# Patient Record
Sex: Male | Born: 1935 | Race: White | Hispanic: No | Smoking: Never smoker
Health system: Southern US, Community
[De-identification: ages and names within clinical notes are randomized; demographics above are authoritative.]

## PROBLEM LIST (undated history)

## (undated) DIAGNOSIS — E119 Type 2 diabetes mellitus without complications: Secondary | ICD-10-CM

## (undated) DIAGNOSIS — Z950 Presence of cardiac pacemaker: Secondary | ICD-10-CM

## (undated) DIAGNOSIS — G4730 Sleep apnea, unspecified: Secondary | ICD-10-CM

## (undated) DIAGNOSIS — I509 Heart failure, unspecified: Secondary | ICD-10-CM

## (undated) DIAGNOSIS — J302 Other seasonal allergic rhinitis: Secondary | ICD-10-CM

## (undated) DIAGNOSIS — E785 Hyperlipidemia, unspecified: Secondary | ICD-10-CM

## (undated) DIAGNOSIS — I5042 Chronic combined systolic (congestive) and diastolic (congestive) heart failure: Secondary | ICD-10-CM

## (undated) DIAGNOSIS — I219 Acute myocardial infarction, unspecified: Secondary | ICD-10-CM

## (undated) DIAGNOSIS — Z860100 Personal history of colon polyps, unspecified: Secondary | ICD-10-CM

## (undated) DIAGNOSIS — M1990 Unspecified osteoarthritis, unspecified site: Secondary | ICD-10-CM

## (undated) DIAGNOSIS — Z8739 Personal history of other diseases of the musculoskeletal system and connective tissue: Secondary | ICD-10-CM

## (undated) DIAGNOSIS — I442 Atrioventricular block, complete: Secondary | ICD-10-CM

## (undated) DIAGNOSIS — I10 Essential (primary) hypertension: Secondary | ICD-10-CM

## (undated) DIAGNOSIS — C4490 Unspecified malignant neoplasm of skin, unspecified: Secondary | ICD-10-CM

## (undated) HISTORY — DX: Sleep apnea, unspecified: G47.30

## (undated) HISTORY — PX: JOINT REPLACEMENT: SHX530

## (undated) HISTORY — PX: TOTAL KNEE ARTHROPLASTY: SHX125

## (undated) HISTORY — DX: Other seasonal allergic rhinitis: J30.2

## (undated) HISTORY — PX: TRANSURETHRAL RESECTION OF PROSTATE: SHX73

## (undated) HISTORY — PX: POLYPECTOMY: SHX149

## (undated) HISTORY — PX: COLONOSCOPY: SHX174

## (undated) HISTORY — DX: Atrioventricular block, complete: I44.2

## (undated) HISTORY — DX: Personal history of colonic polyps: Z86.010

## (undated) HISTORY — DX: Personal history of colon polyps, unspecified: Z86.0100

## (undated) HISTORY — PX: TONSILLECTOMY: SUR1361

## (undated) HISTORY — DX: Essential (primary) hypertension: I10

## (undated) HISTORY — DX: Hyperlipidemia, unspecified: E78.5

## (undated) HISTORY — DX: Unspecified malignant neoplasm of skin, unspecified: C44.90

## (undated) HISTORY — DX: Heart failure, unspecified: I50.9

## (undated) HISTORY — DX: Chronic combined systolic (congestive) and diastolic (congestive) heart failure: I50.42

---

## 1966-04-06 HISTORY — PX: CHOLECYSTECTOMY OPEN: SUR202

## 1997-11-08 ENCOUNTER — Other Ambulatory Visit: Admission: RE | Admit: 1997-11-08 | Discharge: 1997-11-08 | Payer: Self-pay | Admitting: Urology

## 1998-01-02 ENCOUNTER — Other Ambulatory Visit: Admission: RE | Admit: 1998-01-02 | Discharge: 1998-01-02 | Payer: Self-pay | Admitting: Gastroenterology

## 1999-01-07 ENCOUNTER — Encounter: Payer: Self-pay | Admitting: Urology

## 1999-01-08 ENCOUNTER — Inpatient Hospital Stay (HOSPITAL_COMMUNITY): Admission: RE | Admit: 1999-01-08 | Discharge: 1999-01-10 | Payer: Self-pay | Admitting: Urology

## 1999-01-15 ENCOUNTER — Encounter: Payer: Self-pay | Admitting: Emergency Medicine

## 1999-01-15 ENCOUNTER — Inpatient Hospital Stay (HOSPITAL_COMMUNITY): Admission: EM | Admit: 1999-01-15 | Discharge: 1999-01-19 | Payer: Self-pay | Admitting: Emergency Medicine

## 1999-06-09 ENCOUNTER — Encounter: Payer: Self-pay | Admitting: Orthopaedic Surgery

## 1999-06-12 ENCOUNTER — Inpatient Hospital Stay (HOSPITAL_COMMUNITY): Admission: RE | Admit: 1999-06-12 | Discharge: 1999-06-15 | Payer: Self-pay | Admitting: Orthopaedic Surgery

## 2001-05-26 ENCOUNTER — Other Ambulatory Visit: Admission: RE | Admit: 2001-05-26 | Discharge: 2001-05-26 | Payer: Self-pay | Admitting: Dermatology

## 2002-09-11 ENCOUNTER — Other Ambulatory Visit: Admission: RE | Admit: 2002-09-11 | Discharge: 2002-09-11 | Payer: Self-pay | Admitting: Dermatology

## 2002-10-16 ENCOUNTER — Other Ambulatory Visit: Admission: RE | Admit: 2002-10-16 | Discharge: 2002-10-16 | Payer: Self-pay | Admitting: Dermatology

## 2004-07-10 ENCOUNTER — Other Ambulatory Visit: Admission: RE | Admit: 2004-07-10 | Discharge: 2004-07-10 | Payer: Self-pay | Admitting: Dermatology

## 2004-07-28 ENCOUNTER — Ambulatory Visit: Payer: Self-pay | Admitting: Gastroenterology

## 2004-08-11 ENCOUNTER — Ambulatory Visit: Payer: Self-pay | Admitting: Gastroenterology

## 2004-12-17 ENCOUNTER — Other Ambulatory Visit: Admission: RE | Admit: 2004-12-17 | Discharge: 2004-12-17 | Payer: Self-pay | Admitting: Dermatology

## 2005-03-09 ENCOUNTER — Ambulatory Visit (HOSPITAL_COMMUNITY): Admission: RE | Admit: 2005-03-09 | Discharge: 2005-03-09 | Payer: Self-pay | Admitting: Family Medicine

## 2008-11-09 ENCOUNTER — Encounter: Payer: Self-pay | Admitting: Cardiology

## 2008-11-09 ENCOUNTER — Ambulatory Visit: Payer: Self-pay | Admitting: Cardiology

## 2008-11-09 ENCOUNTER — Ambulatory Visit (HOSPITAL_COMMUNITY): Admission: RE | Admit: 2008-11-09 | Discharge: 2008-11-09 | Payer: Self-pay | Admitting: Cardiology

## 2008-11-09 DIAGNOSIS — N400 Enlarged prostate without lower urinary tract symptoms: Secondary | ICD-10-CM | POA: Insufficient documentation

## 2008-11-09 DIAGNOSIS — C61 Malignant neoplasm of prostate: Secondary | ICD-10-CM

## 2008-11-09 DIAGNOSIS — M1990 Unspecified osteoarthritis, unspecified site: Secondary | ICD-10-CM | POA: Insufficient documentation

## 2008-11-09 DIAGNOSIS — E119 Type 2 diabetes mellitus without complications: Secondary | ICD-10-CM

## 2008-11-09 DIAGNOSIS — I10 Essential (primary) hypertension: Secondary | ICD-10-CM

## 2008-11-09 DIAGNOSIS — Z96659 Presence of unspecified artificial knee joint: Secondary | ICD-10-CM

## 2008-11-12 ENCOUNTER — Encounter: Payer: Self-pay | Admitting: Cardiology

## 2008-11-12 LAB — CONVERTED CEMR LAB
BUN: 15 mg/dL (ref 6–23)
Basophils Absolute: 0 10*3/uL (ref 0.0–0.1)
Basophils Relative: 0 % (ref 0–1)
CO2: 25 meq/L (ref 19–32)
Calcium: 9.3 mg/dL (ref 8.4–10.5)
Chloride: 102 meq/L (ref 96–112)
Creatinine, Ser: 1.13 mg/dL (ref 0.40–1.50)
Eosinophils Absolute: 0.4 10*3/uL (ref 0.0–0.7)
Eosinophils Relative: 7 % — ABNORMAL HIGH (ref 0–5)
Glucose, Bld: 133 mg/dL — ABNORMAL HIGH (ref 70–99)
HCT: 43 % (ref 39.0–52.0)
Hemoglobin: 14.4 g/dL (ref 13.0–17.0)
INR: 0.9 (ref 0.0–1.5)
Lymphocytes Relative: 11 % — ABNORMAL LOW (ref 12–46)
Lymphs Abs: 0.7 10*3/uL (ref 0.7–4.0)
MCHC: 33.4 g/dL (ref 30.0–36.0)
MCV: 85.4 fL (ref 78.0–100.0)
Monocytes Absolute: 0.6 10*3/uL (ref 0.1–1.0)
Monocytes Relative: 9 % (ref 3–12)
Neutro Abs: 4.3 10*3/uL (ref 1.7–7.7)
Neutrophils Relative %: 71 % (ref 43–77)
Platelets: 141 10*3/uL — ABNORMAL LOW (ref 150–400)
Potassium: 4 meq/L (ref 3.5–5.3)
Prothrombin Time: 12.5 s (ref 11.6–15.2)
RBC: 5.04 M/uL (ref 4.22–5.81)
RDW: 13.5 % (ref 11.5–15.5)
Sodium: 137 meq/L (ref 135–145)
WBC: 6.1 10*3/uL (ref 4.0–10.5)
aPTT: 25 s (ref 24–37)

## 2008-11-14 ENCOUNTER — Inpatient Hospital Stay (HOSPITAL_BASED_OUTPATIENT_CLINIC_OR_DEPARTMENT_OTHER): Admission: RE | Admit: 2008-11-14 | Discharge: 2008-11-14 | Payer: Self-pay | Admitting: Cardiology

## 2008-11-14 ENCOUNTER — Ambulatory Visit: Payer: Self-pay | Admitting: Cardiology

## 2008-11-19 ENCOUNTER — Ambulatory Visit (HOSPITAL_COMMUNITY): Admission: RE | Admit: 2008-11-19 | Discharge: 2008-11-19 | Payer: Self-pay | Admitting: Cardiology

## 2008-11-20 DIAGNOSIS — J8410 Pulmonary fibrosis, unspecified: Secondary | ICD-10-CM

## 2008-11-22 ENCOUNTER — Encounter: Payer: Self-pay | Admitting: Cardiology

## 2008-11-29 ENCOUNTER — Ambulatory Visit: Payer: Self-pay | Admitting: Cardiology

## 2008-11-29 DIAGNOSIS — R0602 Shortness of breath: Secondary | ICD-10-CM

## 2008-12-03 ENCOUNTER — Encounter: Payer: Self-pay | Admitting: Cardiology

## 2008-12-18 ENCOUNTER — Ambulatory Visit (HOSPITAL_COMMUNITY): Admission: RE | Admit: 2008-12-18 | Discharge: 2008-12-18 | Payer: Self-pay | Admitting: Pulmonary Disease

## 2009-01-29 ENCOUNTER — Encounter: Payer: Self-pay | Admitting: Cardiology

## 2009-02-11 ENCOUNTER — Ambulatory Visit: Admission: RE | Admit: 2009-02-11 | Discharge: 2009-02-11 | Payer: Self-pay | Admitting: Pulmonary Disease

## 2009-07-12 ENCOUNTER — Encounter (INDEPENDENT_AMBULATORY_CARE_PROVIDER_SITE_OTHER): Payer: Self-pay | Admitting: *Deleted

## 2010-04-06 HISTORY — PX: COLONOSCOPY: SHX174

## 2010-05-06 NOTE — Letter (Signed)
Summary: Colonoscopy Letter  [facility]  [address] [place], [place] [address]   Phone: [phone]  Fax: [phone]      July 12, 2009 MRN: [id number]   [facility] [address] [place], [place]  [address]   Dear Anthony Lucas,   According to your medical record, it is time for you to schedule a Colonoscopy. The American Cancer Society recommends this procedure as a method to detect early colon cancer. Patients with a family history of colon cancer, or a personal history of colon polyps or inflammatory bowel disease are at increased risk.  This letter has beeen generated based on the recommendations made at the time of your procedure. If you feel that in your particular situation this may no longer apply, please contact our office.  Please call our office at [phone] to schedule this appointment or to update your records at your earliest convenience.  Thank you for cooperating with [place] to provide you with the very best care possible.   Sincerely,    Vania Rea. Jarold Motto, M.D.  [facility] [phone]

## 2010-07-11 LAB — BLOOD GAS, ARTERIAL
Acid-Base Excess: 1.2 mmol/L (ref 0.0–2.0)
FIO2: 0.21 %
O2 Saturation: 95.1 %
Patient temperature: 37
pO2, Arterial: 75.8 mmHg — ABNORMAL LOW (ref 80.0–100.0)

## 2010-07-13 LAB — POCT I-STAT 3, VENOUS BLOOD GAS (G3P V)
Acid-Base Excess: 1 mmol/L (ref 0.0–2.0)
Acid-Base Excess: 2 mmol/L (ref 0.0–2.0)
Bicarbonate: 27.2 mEq/L — ABNORMAL HIGH (ref 20.0–24.0)
O2 Saturation: 66 %
O2 Saturation: 68 %
TCO2: 29 mmol/L (ref 0–100)
TCO2: 29 mmol/L (ref 0–100)
pO2, Ven: 35 mmHg (ref 30.0–45.0)

## 2010-07-13 LAB — POCT I-STAT GLUCOSE: Glucose, Bld: 132 mg/dL — ABNORMAL HIGH (ref 70–99)

## 2010-08-19 NOTE — Cardiovascular Report (Signed)
NAMEMERRIT, FRIESEN                  ACCOUNT NO.:  [id number]   MEDICAL RECORD NO.:  [id number]          PATIENT TYPE:  OIB   LOCATION:  1962                         FACILITY:  [facility]   PHYSICIAN:  Arturo Morton. Riley Kill, MD, FACCDATE OF BIRTH:  08/30/1930   DATE OF PROCEDURE:  11/14/2008  DATE OF DISCHARGE:  11/14/2008                            CARDIAC CATHETERIZATION   INDICATIONS:  Mr. Grissinger is a 77 year old who has presented with fatigue  and shortness of breath.  He has not had fever or substantial cough.  Chest x-ray was fairly abnormal and a CT scan has been planned.  The  patient is, however, diabetic and with his symptoms, it was felt that  cardiac catheterization was indicated.  Risks, benefits, and  alternatives were discussed with the patient and the procedure explained  to him prior to the procedure.  He does have an interventricular  conduction delay compatible with left bundle.   PROCEDURE:  1. Right and left heart catheterization.  2. Selective coronary arteriography.  3. Selective left ventriculography.   DESCRIPTION OF PROCEDURE:  The patient was brought to the  catheterization laboratory and prepped and draped in usual fashion.  He  had been set up for left heart cath.  Views of the left and right  coronaries were obtained using 4-French catheters.  Central aortic and  left ventricular pressures were measured with a pigtail, and  ventriculography was performed in the RAO projection.  Given the  patient's chest x-ray, his dyspnea of effort, and lack of significant  critical coronary artery disease, it was felt that it would be better to  obtain the right heart pressures at this point in time.  As a result, we  used a Smart needle to gain access to the right femoral vein and a 7-  [place] sheath was placed.  Thermodilution Swan-Ganz catheter was then  taken to the superior vena cava where saturation was obtained.  Serial  right heart pressures were then obtained as well  as thermodilution  cardiac outputs.  PA sat was also obtained.  Subsequently, the right  heart catheter was removed.  Hemostasis was achieved by direct manual  compression.  He was taken to the holding area.  I subsequently spoke  with family and with Dr. Daleen Squibb regarding the findings.  He will be seen  back in followup at [facility].   HEMODYNAMIC DATA:  1. Right atrial pressure 7.  2. RV 38/9.  3. Pulmonary artery 37/18, mean 27.  4. Pulmonary capillary wedge 14.  5. Thermodilution cardiac output 4.0 liters per minute.  6. Thermodilution cardiac index 1.7 liters per minute per meter      squared.  7. Fick cardiac output 5.3 liters per minute.  8. Fick cardiac index 2.3 liters per minute per meter squared.  9. Aortic saturation 96%.  10.Pulmonary artery saturation 68%.  11.Superior vena cava saturation 66%.   ANGIOGRAPHIC DATA:  1. Ventriculography done in the RAO projection revealed preserved with      low normal overall ejection fraction, estimated EF would be in the  range of 50-55%.  On one beat, there was some diastolic mitral      regurgitation but systolic MR was not thought to be prominent.  No      definite wall motion abnormalities were visualized.  2. The right coronary artery is a moderate-sized vessel.  It is      relatively smooth with minimal luminal irregularity.  There is a      large PDA and 2 smaller posterolateral branches, both of which are      without critical narrowing.  3. The left main coronary artery is free of critical disease.  4. The LAD courses to the apex.  After a small diagonal and septal,      there is a little bit of calcified plaque measuring about 30%      luminal reduction.  The distal LAD tapers but is without critical      disease.  5. The circumflex provides predominantly 2 large marginal branches,      both of which are free of critical disease.   CONCLUSIONS:  1. Mild pulmonary hypertension.  2. No critical coronary artery  disease.  3. Low normal overall ejection fraction.  4. Abnormal chest x-ray.   PLAN:  Two-dimensional echocardiography will be recommended.  CT scan of  the chest has already been scheduled.  Follow up will be with Dr. Daleen Squibb  to conclude where the next portion of the evaluation will go.   ADDENDUM  Aortic pressure was 159/80, mean 113.  LV pressure 161/23.      Arturo Morton. Riley Kill, MD, [facility]  Electronically Signed     TDS/MEDQ  D:  11/14/2008  T:  11/14/2008  Job:  [id number]   cc:   Thomas C. Wall, MD, [facility]  Scott A. Gerda Diss, MD  CV Laboratory

## 2010-08-19 NOTE — Assessment & Plan Note (Signed)
[facility]                       [place] [facility] OFFICE NOTE   NAME:Schillo, JESSIE SCHRIEBER                         MRN:          [id number]  DATE:11/09/2008                            DOB:          1931/01/31    CHIEF COMPLAINT:  Just give out quick and short of breath.   HISTORY OF PRESENT ILLNESS:  [name] is a very pleasant 77-year-  old married white male who has been having increased shortness of breath  with exertion and early fatigue over the last couple of months.  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   He recently saw Lorin Picket Luking and EKG showed a left bundle-branch block.  He is diabetic and has been for 7 or 8 years.  He was referred for an  ischemic evaluation.   He denies any angina.  He denies any orthopnea, PND, or peripheral  edema.  He has had no tachy palpitations or syncope.   His past medical history is significant for benign prostatic  hypertrophy, hypertension, osteoarthritis, status post left knee  replacement, colonic polyps, type 2 diabetes, prostate CA status post  seed treatment.   Surgeries include a T and A in 1981, cholecystectomy in 1971, left knee  replacement, and a TURP in 2001.   He is current meds include:  1. Toprol-XL 50 mg once a day.  2. Proscar are 5 mg a day.  3. Hydrochlorothiazide 25 mg per day.  4. Allopurinol 100 mg per day.  5. Metformin 500 mg per day.  6. Simvastatin 40 mg at bedtime.  7. Potassium 10 mEq 2 tablets in the morning daily.  8. Terazosin 5 mg p.o. daily.  9. Aspirin 81 mg per day.   His family history is noncontributory.   SOCIAL HISTORY:  He is married.  He does not use tobacco.  He drinks  Bourbon about 4 ounces a day.  He does not regularly exercise but enjoys  manual work.   REVIEW OF SYSTEMS:  He denies any anorexia, weight loss or weight gain,  visual loss or symptoms of TIAs, syncope, peripheral  edema, prolonged  cough, hemoptysis, change in bowel habits, muscle weakness, difficulty  walking.   PHYSICAL EXAMINATION:  GENERAL:  He is a pleasant gentleman in no acute  distress.  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  He is being followed by  Dermatology and have been treated most of them.  He is slightly  overweight.  He looks very muscular for a man his age.  VITAL SIGNS:  His blood pressure is 139/80 in the right arm.  His pulse  is 67 and regular.  HEENT:  Normocephalic and atraumatic.  No facial asymmetry.  Dentition  in fair condition.  Oral mucosa normal.  NECK:  Supple.  Carotid upstrokes were equal bilaterally without bruits.  No JVD.  Thyroid is not enlarged.  Trachea is midline.  CHEST/LUNGS:  Clear to auscultation and percussion.  HEART:  A nondisplaced PMI.  He has a normal S1 and a paradoxically  split S2.  There is no murmur.  ABDOMEN:  Soft.  Good bowel sounds.  No midline bruit.  No hepatomegaly.  EXTREMITIES:  There were no cyanosis, clubbing, or edema.  Pulses are  2+/4+, bilaterally symmetrical.  NEUROLOGIC:  Intact.  MUSCULOSKELETAL:  Chronic arthritic changes.  SKIN:  Notable for the Cancers as mentioned above.   EKG shows normal sinus rhythm with left bundle-branch block.  Other than  the EKG from Dr. Fletcher Anon office, I have none to compare.   ASSESSMENT:  Increased dyspnea on exertion with exertional fatigue in a  diabetic with multiple risk factors, new left bundle-branch block, rule  out obstructive coronary artery disease.   PLAN:  Cardiac cath as an outpatient.  Indications, risks, potential  benefits have been discussed with the patient and his wife.  They agree  to proceed.  There is no history of contrast allergy.     Thomas C. Daleen Squibb, MD, [facility]  Electronically Signed    TCW/MedQ  DD: 11/09/2008  DT: 11/09/2008  Job #: [id number]   cc:   Lorin Picket A. Gerda Diss, MD

## 2010-08-22 NOTE — Discharge Summary (Signed)
. [facility]  Patient:    Anthony Lucas, Anthony Lucas                         MRN: [id number] Adm. Date:  [id number] Disc. Date: [id number] Attending:  Randolm Idol Dictator:   Arnoldo Morale, P.A.                           Discharge Summary  ADMISSION DIAGNOSES: 1. End-stage osteoarthritis of the left knee. 2. Hypertension. 3. Gout. 4. History of benign prostatic hypertrophy.  DISCHARGE DIAGNOSES:  Same with the addition of: 1. Urinary retention and hematuria, resolved. 2. Post-hemorrhagic anemia.  SURGICAL PROCEDURE:  On 06/12/99, he underwent a left total knee arthroplasty by r. Claude Manges. [place].  COMPLICATIONS:  None.  CONSULTS: 1. Pharmacy consult for Coumadin therapy 06/12/99. 2. Urology consult by Dr. Vernie Ammons on 06/12/99. 3. Physical therapy and case management consult 06/13/99. 4. Rehabilitation medicine consult on 06/13/99.  HISTORY OF PRESENT ILLNESS:  This 77 year old white male presented to Dr. Cleophas Dunker with a three year history of progressively worsening left knee pain.  It is described as a popping sensation in the knee with a sharp pain throughout the knee with activities.  There is a constant ache in the knee and a fair amount of crepitus and swelling.  The pain increases with ambulation and he does have difficulty sleeping.  He has had several aspirations of the knee with cortisone  shots with moderate relief.  He currently is able to ambulate without the use of a cane or a walker, but the pain is severe enough that he is presenting for a left total knee arthroplasty.  HOSPITAL COURSE:  [name] tolerated his surgical procedure well without any immediate postoperative complications.  He was transferred to [address].  That night, he was unable to void and the nurses were unable to place a catheter initially.  Eventually, an 18-French Coude catheter was inserted and did well until about midnight and then they were unable to irrigate  or get any urine back from the catheter.  Dr. Vernie Ammons was paged and he came in and treated him at that time. n postoperative day 1, his T-max was 101.3 and his vitals were stable. Hemoglobin was 9.3 with hematocrit of 28.1.  He tolerated CPM to 0 to 70 degrees. Epidural was controlling his pain well.  Left knee dressing was intact without drainage.  Leg was neurovascularly intact.  He was started on PT per protocol.  Postoperative day 2, he was afebrile, vital signs stable, and his left knee incision was well approximated with staples.  Drain was discontinued, epidural discontinued, and he was started on p.o. pain meds.  H&H were monitored.  He continued to do well over the next several days and remained afebrile with his vital signs stable.  OxyContin controlled his pain well and he was doing well enough to be ready for discharge home on 06/15/99.  DISCHARGE INSTRUCTIONS: 1. He was to resume all prehospitalization medications and diet. 2. OxyContin 10 mg 1 tablet p.o. q. 12 hours, OxyIR 5 mg 1-2 p.o. q. 4 hours p.r.n.    for pain, and Coumadin 5 mg p.o. q. day. 3. He was to be out of bed with a walker, 50% weight bearing or less on the left    leg. 4. He is to keep the left knee incision clean and dry an notify Dr. Cleophas Dunker  of    any fever greater than 101.5, chills, pain unrelieved by pain meds or foul    smelling drainage from the wound. 5. He is to arrange for home health physical therapy and a home health R.N. 6. He is to follow up with Dr. Cleophas Dunker in the office in 7-10 days and was to    call [phone] to set up that appointment.  He stated good understanding of    these instructions and was discharged home.  LABORATORY DATA:  On 01/15/99, he had a chest x-ray, which showed mild chronic obstructive pulmonary disease and questionable bronchitis.  On 06/09/99, his hemoglobin was 12.8 with hematocrit of 37.2.  On 3/9, his hemoglobin was 9.3 with hematocrit 28.1.  On 3/10,  his hemoglobin was 9.2 with a hematocrit of 28.  On 3/11, his hemoglobin was 8.7 with hematocrit of 28.  On 3/11, his hemoglobin was 8.7 with hematocrit 25.9.  On 3/5, his PT was 14 seconds with an INR of 1.2 and a PTT of 27.  On 3/11, his PT was 17.4 seconds with an INR of 1.8.  On 06/09/99, his chloride was 95, sodium 138, CO2 30, glucose 138, ALT 48, AST 49, and ALP 62.  Urinalysis on 06/13/99 showed red cloudy urine with 15 mg/dL of ketones, 454 mg/dL of protein, large amount of hemoglobin, rare epithelial, 11 WBCs, too numerous to count RBCs, and few bacteria.  Urine culture done at that time showed no growth. All other laboratory studies were within normal limits. DD:  07/26/99 TD:  07/27/99 Job: [id number]

## 2010-08-22 NOTE — Op Note (Signed)
[place]  Patient:    Anthony Lucas, Anthony Lucas                         MRN: [id number] Proc. Date: 06/11/99 Adm. Date:  [id number] Attending:  Randolm Idol                           Operative Report  PREOPERATIVE DIAGNOSIS:  Osteoarthritis of the left knee.  POSTOPERATIVE DIAGNOSIS:  Osteoarthritis of the left knee.  OPERATION:  Left total knee replacement.  SURGEON:  Claude Manges. Cleophas Dunker, M.D.  ASSISTANT:  Jamelle Rushing, P.A.-C.  ANESTHESIA:  General orotracheal  COMPLICATIONS:  None.  COMPONENTS:  DePuy LCS large femur, large plus rotating tibia platform with a 10 mg bridging bearing and a rotating metal back patella.  All were secured with polymethyl methacrylate.  DESCRIPTION OF PROCEDURE:  With the patient comfortable on the operating table nd under general orotracheal anesthesia, the left lower extremity was placed in thigh tourniquet.  The leg was then prepped with Duraprep and a thigh tourniquet to the mid foot. Sterile draping was performed. With the extremity still elevated it was Esmarch exsanguinated with a proximal tourniquet to 350 mmHg.  A midline longitudinal incision was made and centered over the patella and via sharp dissection carried down to the subcutaneous tissue, extended from the superior pouch to the tibial tubercle.  The first layer of capsule was incised n the midline.  The deep capsule was incised on the medial parapatellar fascia with the Bovie.  There was about 5 to 6 cc of clear yellow joint effusion.  The patella was then everted 180 degrees and the knee flexed to 90 degrees. There were large osteophytes along the medial and lateral femoral condyle.  Complete absence of articular cartilage with eburnated bone on the medial femoral condyle and a moderate amount of beefy red synovial tissue.  A complete synovectomy was  performed.  The osteophytes were removed.  Preoperatively, we had measured  a large femoral component, large plus tibial component.  These were confirmed intraoperatively.  The appropriate tibial and femoral jigs were applied to obtain the appropriate cuts.  We used 10 mm flexion and extension gaps which were symmetrical.  The ACL and PCL were sacrificed as we used a deep dish tibial component for anterior and posterior stability. A  4 degree distal femoral valgus cut was made.  The lamina spreaders were inserted to remove the medial and lateral menisci and  remnants of ACL and PCL.  There was a fixed flexor contracture preoperatively and a capsular release was performed with the Cobb elevator.  Osteophytes were removed along the medial tibial plateau and even posterior tibia to prevent postoperative pain and impingement. The trial components were then impacted using a large plus rotating tibial platform and 10 mm bridging bearing and a large femoral component. We had excellent fit at each of the components.  As I placed the knee through a  full range of motion, there was slight hyperextension. We could flex probably 125 to 130 degrees without step-off or instability with the varus or valgus stress.  There was no malrotation of the tibial component.  The patella was then prepared by removing 11 mm of bone, leaving approximately 13 in place.  The cruciate backed jig was applied and the appropriate cut made to accept the patella.  A trial patella was impacted. Fit flush  in the patella and  placed through a full range of motion with no lateral subluxation.  Each of the trial components were then removed.  The joint was then copiously lavaged with jet saline and antibiotic solution.  The knee was flexed.  The McHale retractor was inserted behind the tibia and each of the components were then applied and fixed with polymethyl methacrylate.  Extraneous methacrylate was removed with Michaelle Copas. The knee was placed in full extension for further compression.   Again we had excellent fit.  The patella was applied with the patella clamp using methacrylate.  After complete maturation of the methacrylate, the patellar clamp was removed. The knee was placed through a full range of motion without evidence of instability.  Extraneous methacrylate was removed with a small osteotome.  The joint was again copiously irrigated with saline and antibiotic solution. Tourniquet was deflated.  Bleeders were Bovie coagulated.  Hemovac was inserted. Deep capsule was closed with interrupted #1 Ethibon.  The superficial capsule closed with running 0 Vicryl. Subcuticular with 2-0 Vicryl and the skin closed ith skin clips.  Sterile bulky dressing was applied. Followed by Ace bandage and a nee immobilizer.  The patient was to receive a postoperative epidural.  The patient tolerated the procedure without complications. DD:  06/12/99 TD:  06/13/99 Job: [id number]

## 2010-08-22 NOTE — Procedures (Signed)
[place]  Patient:    NOOR, VIDALES                         MRN: [id number] Proc. Date: 06/12/99 Adm. Date:  [id number] Attending:  Randolm Idol                           Procedure Report  PROCEDURE:  Lumbar epidural catheter placement for postoperative pain control.  SURGEON:  Guadalupe Maple, M.D.  ANESTHESIA:  INDICATIONS:  The patient is a 77 year old white male with a history of degenerative joint disease of the left knee, who underwent a left total knee replacement by Claude Manges. Cleophas Dunker, M.D. under general anesthesia.  Prior to the  procedure, the risks and benefits of epidural pain control were discussed with he patient.  Risks included bleeding, headache, infection, nerve root injury, and he patient understood these risks and agreed to proceed.  He was on pain control prior to surgery.  DESCRIPTION OF PROCEDURE:  Upon completion of the procedure while the patient was still under general endotracheal anesthesia, he was turned to the left lateral decubitus position.  The low back was prepped and draped sterilely.  The L3-4 interspace was entered in the midline using an 18-gauge Tuohy needle.  One pass of the needle was required to enter the epidural space using loss of resistance _______.  2 cc of _____ Marcaine was injected through the needle without difficulty.  The catheter was then threaded 4 cm into the epidural space, and the needle was removed without difficulty.  The catheter was then taped securely in  place, and the patient was subsequently extubated, and brought to the recovery oom in stable condition.  He will then be begun on a ______ Marcaine infusion, and followed on a daily basis by the anesthesia service.  He tolerated the procedure without apparent complications. DD:  06/12/99 TD:  06/14/99 Job: [id number]

## 2010-11-10 ENCOUNTER — Telehealth: Payer: Self-pay | Admitting: *Deleted

## 2010-11-10 NOTE — Telephone Encounter (Signed)
Scheduled pre visit 11/13/2010 and  Colonoscopy on 11/26/2010 advised with he will need someone to drive him here and home on that day. I have went over everything that is needed for the previsit and colonoscopy date I will not mail a letter since its this week. I gave them the number and address if they have problems.

## 2010-11-13 ENCOUNTER — Ambulatory Visit (AMBULATORY_SURGERY_CENTER): Payer: Medicare Other

## 2010-11-13 VITALS — Ht 73.0 in | Wt 235.0 lb

## 2010-11-13 DIAGNOSIS — Z1211 Encounter for screening for malignant neoplasm of colon: Secondary | ICD-10-CM

## 2010-11-13 MED ORDER — PEG-KCL-NACL-NASULF-NA ASC-C 100 G PO SOLR: ORAL | Status: DC

## 2010-11-26 ENCOUNTER — Ambulatory Visit (AMBULATORY_SURGERY_CENTER): Payer: Medicare Other | Admitting: Gastroenterology

## 2010-11-26 ENCOUNTER — Encounter: Payer: Self-pay | Admitting: Gastroenterology

## 2010-11-26 DIAGNOSIS — D175 Benign lipomatous neoplasm of intra-abdominal organs: Secondary | ICD-10-CM

## 2010-11-26 DIAGNOSIS — Z86010 Personal history of colonic polyps: Secondary | ICD-10-CM

## 2010-11-26 DIAGNOSIS — K5730 Diverticulosis of large intestine without perforation or abscess without bleeding: Secondary | ICD-10-CM | POA: Insufficient documentation

## 2010-11-26 DIAGNOSIS — Z1211 Encounter for screening for malignant neoplasm of colon: Secondary | ICD-10-CM | POA: Insufficient documentation

## 2010-11-26 LAB — GLUCOSE, CAPILLARY: Glucose-Capillary: 156 mg/dL — ABNORMAL HIGH (ref 70–99)

## 2010-11-26 MED ORDER — SODIUM CHLORIDE 0.9 % IV SOLN: 500.0000 mL | INTRAVENOUS | Status: DC

## 2010-11-26 NOTE — Patient Instructions (Signed)
Discharge instructions given with verbal understanding. Handouts on diverticulosis and hemorrhoids given. Resume previous medications.

## 2010-11-27 ENCOUNTER — Telehealth: Payer: Self-pay | Admitting: *Deleted

## 2010-11-27 NOTE — Telephone Encounter (Signed)

## 2011-03-18 ENCOUNTER — Encounter: Payer: Self-pay | Admitting: Cardiology

## 2011-04-21 ENCOUNTER — Ambulatory Visit (INDEPENDENT_AMBULATORY_CARE_PROVIDER_SITE_OTHER): Payer: Medicare Other | Admitting: Urology

## 2011-04-21 DIAGNOSIS — R319 Hematuria, unspecified: Secondary | ICD-10-CM | POA: Diagnosis not present

## 2011-04-21 DIAGNOSIS — R310 Gross hematuria: Secondary | ICD-10-CM | POA: Diagnosis not present

## 2011-04-21 DIAGNOSIS — N400 Enlarged prostate without lower urinary tract symptoms: Secondary | ICD-10-CM

## 2011-04-21 DIAGNOSIS — D4959 Neoplasm of unspecified behavior of other genitourinary organ: Secondary | ICD-10-CM | POA: Diagnosis not present

## 2011-04-21 DIAGNOSIS — E1149 Type 2 diabetes mellitus with other diabetic neurological complication: Secondary | ICD-10-CM | POA: Diagnosis not present

## 2011-04-21 DIAGNOSIS — E119 Type 2 diabetes mellitus without complications: Secondary | ICD-10-CM | POA: Diagnosis not present

## 2011-05-05 ENCOUNTER — Ambulatory Visit (INDEPENDENT_AMBULATORY_CARE_PROVIDER_SITE_OTHER): Payer: Medicare Other | Admitting: Urology

## 2011-05-05 DIAGNOSIS — R310 Gross hematuria: Secondary | ICD-10-CM

## 2011-05-05 DIAGNOSIS — N400 Enlarged prostate without lower urinary tract symptoms: Secondary | ICD-10-CM | POA: Diagnosis not present

## 2011-06-30 DIAGNOSIS — E1149 Type 2 diabetes mellitus with other diabetic neurological complication: Secondary | ICD-10-CM | POA: Diagnosis not present

## 2011-06-30 DIAGNOSIS — E119 Type 2 diabetes mellitus without complications: Secondary | ICD-10-CM | POA: Diagnosis not present

## 2011-07-27 DIAGNOSIS — D235 Other benign neoplasm of skin of trunk: Secondary | ICD-10-CM | POA: Diagnosis not present

## 2011-07-27 DIAGNOSIS — L570 Actinic keratosis: Secondary | ICD-10-CM | POA: Diagnosis not present

## 2011-09-08 DIAGNOSIS — E119 Type 2 diabetes mellitus without complications: Secondary | ICD-10-CM | POA: Diagnosis not present

## 2011-09-08 DIAGNOSIS — E1149 Type 2 diabetes mellitus with other diabetic neurological complication: Secondary | ICD-10-CM | POA: Diagnosis not present

## 2011-09-17 DIAGNOSIS — L578 Other skin changes due to chronic exposure to nonionizing radiation: Secondary | ICD-10-CM | POA: Diagnosis not present

## 2011-09-17 DIAGNOSIS — L570 Actinic keratosis: Secondary | ICD-10-CM | POA: Diagnosis not present

## 2011-09-21 DIAGNOSIS — E119 Type 2 diabetes mellitus without complications: Secondary | ICD-10-CM | POA: Diagnosis not present

## 2011-09-21 DIAGNOSIS — I10 Essential (primary) hypertension: Secondary | ICD-10-CM | POA: Diagnosis not present

## 2011-09-21 DIAGNOSIS — E785 Hyperlipidemia, unspecified: Secondary | ICD-10-CM | POA: Diagnosis not present

## 2011-09-24 DIAGNOSIS — Z79899 Other long term (current) drug therapy: Secondary | ICD-10-CM | POA: Diagnosis not present

## 2011-09-24 DIAGNOSIS — E782 Mixed hyperlipidemia: Secondary | ICD-10-CM | POA: Diagnosis not present

## 2011-09-24 DIAGNOSIS — E119 Type 2 diabetes mellitus without complications: Secondary | ICD-10-CM | POA: Diagnosis not present

## 2011-10-26 DIAGNOSIS — L570 Actinic keratosis: Secondary | ICD-10-CM | POA: Diagnosis not present

## 2011-12-02 ENCOUNTER — Telehealth: Payer: Self-pay | Admitting: *Deleted

## 2011-12-02 NOTE — Telephone Encounter (Signed)
Left a message with pt's wife that he needs occult stool testing yearly and he is due; wife stated understanding.

## 2011-12-02 NOTE — Telephone Encounter (Signed)
Message copied by Florene Glen on Wed Dec 02, 2011 11:04 AM ------      Message from: Florene Glen      Created: Tue Dec 02, 2010 11:11 AM      Regarding: IFOB       Pt needs yearly hemoccult IFOB testing per 11/26/10 COLON.

## 2011-12-16 DIAGNOSIS — E119 Type 2 diabetes mellitus without complications: Secondary | ICD-10-CM | POA: Diagnosis not present

## 2011-12-16 DIAGNOSIS — E1149 Type 2 diabetes mellitus with other diabetic neurological complication: Secondary | ICD-10-CM | POA: Diagnosis not present

## 2012-01-15 DIAGNOSIS — Z23 Encounter for immunization: Secondary | ICD-10-CM | POA: Diagnosis not present

## 2012-02-15 NOTE — Telephone Encounter (Signed)
Pt never called back; wrote him a letter.

## 2012-02-24 DIAGNOSIS — E119 Type 2 diabetes mellitus without complications: Secondary | ICD-10-CM | POA: Diagnosis not present

## 2012-02-24 DIAGNOSIS — E1149 Type 2 diabetes mellitus with other diabetic neurological complication: Secondary | ICD-10-CM | POA: Diagnosis not present

## 2012-03-07 DIAGNOSIS — D235 Other benign neoplasm of skin of trunk: Secondary | ICD-10-CM | POA: Diagnosis not present

## 2012-03-07 DIAGNOSIS — L821 Other seborrheic keratosis: Secondary | ICD-10-CM | POA: Diagnosis not present

## 2012-03-07 DIAGNOSIS — L570 Actinic keratosis: Secondary | ICD-10-CM | POA: Diagnosis not present

## 2012-05-04 DIAGNOSIS — E119 Type 2 diabetes mellitus without complications: Secondary | ICD-10-CM | POA: Diagnosis not present

## 2012-05-04 DIAGNOSIS — E1149 Type 2 diabetes mellitus with other diabetic neurological complication: Secondary | ICD-10-CM | POA: Diagnosis not present

## 2012-05-26 DIAGNOSIS — I10 Essential (primary) hypertension: Secondary | ICD-10-CM | POA: Diagnosis not present

## 2012-05-26 DIAGNOSIS — E119 Type 2 diabetes mellitus without complications: Secondary | ICD-10-CM | POA: Diagnosis not present

## 2012-05-26 DIAGNOSIS — E785 Hyperlipidemia, unspecified: Secondary | ICD-10-CM | POA: Diagnosis not present

## 2012-05-26 DIAGNOSIS — Z79899 Other long term (current) drug therapy: Secondary | ICD-10-CM | POA: Diagnosis not present

## 2012-05-26 DIAGNOSIS — M109 Gout, unspecified: Secondary | ICD-10-CM | POA: Diagnosis not present

## 2012-06-03 DIAGNOSIS — M109 Gout, unspecified: Secondary | ICD-10-CM | POA: Diagnosis not present

## 2012-06-03 DIAGNOSIS — Z79899 Other long term (current) drug therapy: Secondary | ICD-10-CM | POA: Diagnosis not present

## 2012-06-03 DIAGNOSIS — E782 Mixed hyperlipidemia: Secondary | ICD-10-CM | POA: Diagnosis not present

## 2012-06-03 DIAGNOSIS — Z125 Encounter for screening for malignant neoplasm of prostate: Secondary | ICD-10-CM | POA: Diagnosis not present

## 2012-06-23 DIAGNOSIS — H2589 Other age-related cataract: Secondary | ICD-10-CM | POA: Diagnosis not present

## 2012-06-23 DIAGNOSIS — H43819 Vitreous degeneration, unspecified eye: Secondary | ICD-10-CM | POA: Diagnosis not present

## 2012-06-23 DIAGNOSIS — H524 Presbyopia: Secondary | ICD-10-CM | POA: Diagnosis not present

## 2012-06-23 DIAGNOSIS — H52229 Regular astigmatism, unspecified eye: Secondary | ICD-10-CM | POA: Diagnosis not present

## 2012-06-23 DIAGNOSIS — H5210 Myopia, unspecified eye: Secondary | ICD-10-CM | POA: Diagnosis not present

## 2012-07-19 DIAGNOSIS — E1149 Type 2 diabetes mellitus with other diabetic neurological complication: Secondary | ICD-10-CM | POA: Diagnosis not present

## 2012-07-19 DIAGNOSIS — E119 Type 2 diabetes mellitus without complications: Secondary | ICD-10-CM | POA: Diagnosis not present

## 2012-08-08 DIAGNOSIS — E119 Type 2 diabetes mellitus without complications: Secondary | ICD-10-CM | POA: Diagnosis not present

## 2012-08-08 DIAGNOSIS — H2589 Other age-related cataract: Secondary | ICD-10-CM | POA: Diagnosis not present

## 2012-08-31 ENCOUNTER — Encounter (HOSPITAL_COMMUNITY): Payer: Self-pay | Admitting: Pharmacy Technician

## 2012-08-31 NOTE — Patient Instructions (Addendum)
Your procedure is scheduled on: 09/08/2012  Report to [facility] at  820    AM.  Call this number if you have problems the morning of surgery: [phone]   Do not eat food or drink liquids :After Midnight.      Take these medicines the morning of surgery with A SIP OF WATER: allopurinol,proscar,metoprolol   Do not wear jewelry, make-up or nail polish.  Do not wear lotions, powders, or perfumes.   Do not shave 48 hours prior to surgery.  Do not bring valuables to the hospital.  Contacts, dentures or bridgework may not be worn into surgery.  Leave suitcase in the car. After surgery it may be brought to your room.  For patients admitted to the hospital, checkout time is 11:00 AM the day of discharge.   Patients discharged the day of surgery will not be allowed to drive home.  :     Please read over the following fact sheets that you were given: Coughing and Deep Breathing, Surgical Site Infection Prevention, Anesthesia Post-op Instructions and Care and Recovery After Surgery    Cataract A cataract is a clouding of the lens of the eye. When a lens becomes cloudy, vision is reduced based on the degree and nature of the clouding. Many cataracts reduce vision to some degree. Some cataracts make people more near-sighted as they develop. Other cataracts increase glare. Cataracts that are ignored and become worse can sometimes look white. The white color can be seen through the pupil. CAUSES   Aging. However, cataracts may occur at any age, even in newborns.   Certain drugs.   Trauma to the eye.   Certain diseases such as diabetes.   Specific eye diseases such as chronic inflammation inside the eye or a sudden attack of a rare form of glaucoma.   Inherited or acquired medical problems.  SYMPTOMS   Gradual, progressive drop in vision in the affected eye.   Severe, rapid visual loss. This most often happens when trauma is the cause.  DIAGNOSIS  To detect a cataract, an eye doctor  examines the lens. Cataracts are best diagnosed with an exam of the eyes with the pupils enlarged (dilated) by drops.  TREATMENT  For an early cataract, vision may improve by using different eyeglasses or stronger lighting. If that does not help your vision, surgery is the only effective treatment. A cataract needs to be surgically removed when vision loss interferes with your everyday activities, such as driving, reading, or watching TV. A cataract may also have to be removed if it prevents examination or treatment of another eye problem. Surgery removes the cloudy lens and usually replaces it with a substitute lens (intraocular lens, IOL).  At a time when both you and your doctor agree, the cataract will be surgically removed. If you have cataracts in both eyes, only one is usually removed at a time. This allows the operated eye to heal and be out of danger from any possible problems after surgery (such as infection or poor wound healing). In rare cases, a cataract may be doing damage to your eye. In these cases, your caregiver may advise surgical removal right away. The vast majority of people who have cataract surgery have better vision afterward. HOME CARE INSTRUCTIONS  If you are not planning surgery, you may be asked to do the following:  Use different eyeglasses.   Use stronger or brighter lighting.   Ask your eye doctor about reducing your medicine dose or changing  medicines if it is thought that a medicine caused your cataract. Changing medicines does not make the cataract go away on its own.   Become familiar with your surroundings. Poor vision can lead to injury. Avoid bumping into things on the affected side. You are at a higher risk for tripping or falling.   Exercise extreme care when driving or operating machinery.   Wear sunglasses if you are sensitive to bright [place] or experiencing problems with glare.  SEEK IMMEDIATE MEDICAL CARE IF:   You have a worsening or sudden vision  loss.   You notice redness, swelling, or increasing pain in the eye.   You have a fever.  Document Released: 03/23/2005 Document Revised: 03/12/2011 Document Reviewed: 11/14/2010 [facility] Patient Information 2012 [email or web address] INSTRUCTIONS POST-ANESTHESIA  IMMEDIATELY FOLLOWING SURGERY:  Do not drive or operate machinery for the first twenty four hours after surgery.  Do not make any important decisions for twenty four hours after surgery or while taking narcotic pain medications or sedatives.  If you develop intractable nausea and vomiting or a severe headache please notify your doctor immediately.  FOLLOW-UP:  Please make an appointment with your surgeon as instructed. You do not need to follow up with anesthesia unless specifically instructed to do so.  WOUND CARE INSTRUCTIONS (if applicable):  Keep a dry clean dressing on the anesthesia/puncture wound site if there is drainage.  Once the wound has quit draining you may leave it open to air.  Generally you should leave the bandage intact for twenty four hours unless there is drainage.  If the epidural site drains for more than 36-48 hours please call the anesthesia department.  QUESTIONS?:  Please feel free to call your physician or the hospital operator if you have any questions, and they will be happy to assist you.

## 2012-09-01 ENCOUNTER — Encounter (HOSPITAL_COMMUNITY)
Admission: RE | Admit: 2012-09-01 | Discharge: 2012-09-01 | Disposition: A | Discharge status: Home / Self Care | Payer: Medicare Other | Source: Ambulatory Visit | Attending: Ophthalmology | Admitting: Ophthalmology

## 2012-09-01 ENCOUNTER — Other Ambulatory Visit: Payer: Self-pay

## 2012-09-01 ENCOUNTER — Encounter (HOSPITAL_COMMUNITY): Payer: Self-pay

## 2012-09-01 DIAGNOSIS — H2510 Age-related nuclear cataract, unspecified eye: Secondary | ICD-10-CM | POA: Diagnosis not present

## 2012-09-01 DIAGNOSIS — E119 Type 2 diabetes mellitus without complications: Secondary | ICD-10-CM | POA: Diagnosis not present

## 2012-09-01 DIAGNOSIS — I10 Essential (primary) hypertension: Secondary | ICD-10-CM | POA: Diagnosis not present

## 2012-09-01 DIAGNOSIS — Z01812 Encounter for preprocedural laboratory examination: Secondary | ICD-10-CM | POA: Diagnosis not present

## 2012-09-01 DIAGNOSIS — Z01810 Encounter for preprocedural cardiovascular examination: Secondary | ICD-10-CM | POA: Diagnosis not present

## 2012-09-01 DIAGNOSIS — Z79899 Other long term (current) drug therapy: Secondary | ICD-10-CM | POA: Diagnosis not present

## 2012-09-01 LAB — BASIC METABOLIC PANEL
BUN: 18 mg/dL (ref 6–23)
Creatinine, Ser: 1.25 mg/dL (ref 0.50–1.35)
GFR calc Af Amer: 61 mL/min — ABNORMAL LOW (ref >90)
GFR calc non Af Amer: 53 mL/min — ABNORMAL LOW (ref >90)
Glucose, Bld: 170 mg/dL — ABNORMAL HIGH (ref 70–99)

## 2012-09-05 DIAGNOSIS — H2589 Other age-related cataract: Secondary | ICD-10-CM | POA: Diagnosis not present

## 2012-09-07 MED ORDER — NEOMYCIN-POLYMYXIN-DEXAMETH 3.5-10000-0.1 OP OINT
TOPICAL_OINTMENT | OPHTHALMIC | Status: AC
  Filled 2012-09-07: qty 3.5

## 2012-09-07 MED ORDER — CYCLOPENTOLATE-PHENYLEPHRINE 0.2-1 % OP SOLN
OPHTHALMIC | Status: AC
  Filled 2012-09-07: qty 2

## 2012-09-07 MED ORDER — LIDOCAINE HCL (PF) 1 % IJ SOLN
INTRAMUSCULAR | Status: AC
  Filled 2012-09-07: qty 2

## 2012-09-07 MED ORDER — LIDOCAINE HCL 3.5 % OP GEL
OPHTHALMIC | Status: AC
  Filled 2012-09-07: qty 5

## 2012-09-07 MED ORDER — PHENYLEPHRINE HCL 2.5 % OP SOLN
OPHTHALMIC | Status: AC
  Filled 2012-09-07: qty 2

## 2012-09-07 MED ORDER — TETRACAINE HCL 0.5 % OP SOLN
OPHTHALMIC | Status: AC
  Filled 2012-09-07: qty 2

## 2012-09-08 ENCOUNTER — Ambulatory Visit (HOSPITAL_COMMUNITY): Payer: Medicare Other | Admitting: Anesthesiology

## 2012-09-08 ENCOUNTER — Encounter (HOSPITAL_COMMUNITY)
Admission: RE | Disposition: A | Discharge status: Home / Self Care | Payer: Self-pay | Source: Ambulatory Visit | Attending: Ophthalmology

## 2012-09-08 ENCOUNTER — Encounter (HOSPITAL_COMMUNITY): Payer: Self-pay | Admitting: *Deleted

## 2012-09-08 ENCOUNTER — Ambulatory Visit (HOSPITAL_COMMUNITY)
Admission: RE | Admit: 2012-09-08 | Discharge: 2012-09-08 | Disposition: A | Discharge status: Home / Self Care | Payer: Medicare Other | Source: Ambulatory Visit | Attending: Ophthalmology | Admitting: Ophthalmology

## 2012-09-08 ENCOUNTER — Encounter (HOSPITAL_COMMUNITY): Payer: Self-pay | Admitting: Anesthesiology

## 2012-09-08 DIAGNOSIS — Z01810 Encounter for preprocedural cardiovascular examination: Secondary | ICD-10-CM | POA: Diagnosis not present

## 2012-09-08 DIAGNOSIS — Z01812 Encounter for preprocedural laboratory examination: Secondary | ICD-10-CM | POA: Diagnosis not present

## 2012-09-08 DIAGNOSIS — H2510 Age-related nuclear cataract, unspecified eye: Secondary | ICD-10-CM | POA: Diagnosis not present

## 2012-09-08 DIAGNOSIS — I10 Essential (primary) hypertension: Secondary | ICD-10-CM | POA: Insufficient documentation

## 2012-09-08 DIAGNOSIS — H2181 Floppy iris syndrome: Secondary | ICD-10-CM | POA: Diagnosis not present

## 2012-09-08 DIAGNOSIS — Z79899 Other long term (current) drug therapy: Secondary | ICD-10-CM | POA: Insufficient documentation

## 2012-09-08 DIAGNOSIS — H269 Unspecified cataract: Secondary | ICD-10-CM | POA: Diagnosis not present

## 2012-09-08 DIAGNOSIS — H2589 Other age-related cataract: Secondary | ICD-10-CM | POA: Diagnosis not present

## 2012-09-08 DIAGNOSIS — E119 Type 2 diabetes mellitus without complications: Secondary | ICD-10-CM | POA: Insufficient documentation

## 2012-09-08 HISTORY — PX: CATARACT EXTRACTION W/PHACO: SHX586

## 2012-09-08 SURGERY — PHACOEMULSIFICATION, CATARACT, WITH IOL INSERTION
Anesthesia: Monitor Anesthesia Care | Site: Eye | Laterality: Right | Wound class: Clean

## 2012-09-08 MED ORDER — TETRACAINE HCL 0.5 % OP SOLN
1.0000 [drp] | OPHTHALMIC | Status: AC
  Administered 2012-09-08 (×3): 1 [drp] via OPHTHALMIC

## 2012-09-08 MED ORDER — CYCLOPENTOLATE-PHENYLEPHRINE 0.2-1 % OP SOLN
1.0000 [drp] | OPHTHALMIC | Status: AC
  Administered 2012-09-08 (×3): 1 [drp] via OPHTHALMIC

## 2012-09-08 MED ORDER — LIDOCAINE HCL 3.5 % OP GEL: 1.0000 "application " | Freq: Once | OPHTHALMIC | Status: DC

## 2012-09-08 MED ORDER — EPINEPHRINE HCL 1 MG/ML IJ SOLN
INTRAMUSCULAR | Status: AC
Start: 2012-09-08 — End: 2012-09-08
  Filled 2012-09-08: qty 1

## 2012-09-08 MED ORDER — MIDAZOLAM HCL 2 MG/2ML IJ SOLN
INTRAMUSCULAR | Status: AC
  Filled 2012-09-08: qty 2

## 2012-09-08 MED ORDER — LACTATED RINGERS IV SOLN
INTRAVENOUS | Status: DC | PRN
  Administered 2012-09-08: 09:00:00 via INTRAVENOUS

## 2012-09-08 MED ORDER — MIDAZOLAM HCL 2 MG/2ML IJ SOLN
1.0000 mg | INTRAMUSCULAR | Status: DC | PRN
  Administered 2012-09-08: 2 mg via INTRAVENOUS

## 2012-09-08 MED ORDER — BSS IO SOLN
INTRAOCULAR | Status: DC | PRN
  Administered 2012-09-08: 15 mL via INTRAOCULAR

## 2012-09-08 MED ORDER — PROVISC 10 MG/ML IO SOLN
INTRAOCULAR | Status: DC | PRN
  Administered 2012-09-08: 8.5 mg via INTRAOCULAR

## 2012-09-08 MED ORDER — TETRACAINE HCL 0.5 % OP SOLN: 1.0000 [drp] | OPHTHALMIC | Status: DC

## 2012-09-08 MED ORDER — PHENYLEPHRINE HCL 2.5 % OP SOLN
1.0000 [drp] | OPHTHALMIC | Status: AC
  Administered 2012-09-08 (×3): 1 [drp] via OPHTHALMIC

## 2012-09-08 MED ORDER — LACTATED RINGERS IV SOLN
INTRAVENOUS | Status: DC
  Administered 2012-09-08: 09:00:00 via INTRAVENOUS

## 2012-09-08 MED ORDER — NEOMYCIN-POLYMYXIN-DEXAMETH 0.1 % OP OINT
TOPICAL_OINTMENT | OPHTHALMIC | Status: DC | PRN
  Administered 2012-09-08: 1 via OPHTHALMIC

## 2012-09-08 MED ORDER — EPINEPHRINE HCL 1 MG/ML IJ SOLN
INTRAOCULAR | Status: DC | PRN
  Administered 2012-09-08: 10:00:00

## 2012-09-08 MED ORDER — PHENYLEPHRINE HCL 2.5 % OP SOLN: 1.0000 [drp] | OPHTHALMIC | Status: DC

## 2012-09-08 MED ORDER — POVIDONE-IODINE 5 % OP SOLN
OPHTHALMIC | Status: DC | PRN
  Administered 2012-09-08: 1 via OPHTHALMIC

## 2012-09-08 MED ORDER — LIDOCAINE HCL 3.5 % OP GEL
1.0000 "application " | Freq: Once | OPHTHALMIC | Status: AC
  Administered 2012-09-08: 1 via OPHTHALMIC

## 2012-09-08 MED ORDER — EPINEPHRINE HCL 1 MG/ML IJ SOLN
INTRAMUSCULAR | Status: DC | PRN
  Administered 2012-09-08: 10:00:00 via OPHTHALMIC

## 2012-09-08 MED ORDER — CYCLOPENTOLATE-PHENYLEPHRINE 0.2-1 % OP SOLN: 1.0000 [drp] | OPHTHALMIC | Status: DC

## 2012-09-08 SURGICAL SUPPLY — 32 items
CAPSULAR TENSION RING-AMO (OPHTHALMIC RELATED) IMPLANT
CLOTH BEACON ORANGE TIMEOUT ST (SAFETY) ×1 IMPLANT
EYE SHIELD UNIVERSAL CLEAR (GAUZE/BANDAGES/DRESSINGS) ×1 IMPLANT
GLOVE BIO SURGEON STRL SZ 6.5 (GLOVE) IMPLANT
GLOVE BIOGEL PI IND STRL 6.5 (GLOVE) IMPLANT
GLOVE BIOGEL PI IND STRL 7.0 (GLOVE) IMPLANT
GLOVE BIOGEL PI IND STRL 7.5 (GLOVE) IMPLANT
GLOVE BIOGEL PI INDICATOR 6.5 (GLOVE) ×1
GLOVE BIOGEL PI INDICATOR 7.0 (GLOVE)
GLOVE BIOGEL PI INDICATOR 7.5 (GLOVE)
GLOVE ECLIPSE 6.5 STRL STRAW (GLOVE) IMPLANT
GLOVE ECLIPSE 7.0 STRL STRAW (GLOVE) IMPLANT
GLOVE ECLIPSE 7.5 STRL STRAW (GLOVE) IMPLANT
GLOVE EXAM NITRILE LRG STRL (GLOVE) ×1 IMPLANT
GLOVE EXAM NITRILE MD LF STRL (GLOVE) IMPLANT
GLOVE SKINSENSE NS SZ6.5 (GLOVE)
GLOVE SKINSENSE NS SZ7.0 (GLOVE)
GLOVE SKINSENSE STRL SZ6.5 (GLOVE) IMPLANT
GLOVE SKINSENSE STRL SZ7.0 (GLOVE) IMPLANT
KIT VITRECTOMY (OPHTHALMIC RELATED) IMPLANT
PAD ARMBOARD 7.5X6 YLW CONV (MISCELLANEOUS) ×1 IMPLANT
PROC W NO LENS (INTRAOCULAR LENS)
PROC W SPEC LENS (INTRAOCULAR LENS)
PROCESS W NO LENS (INTRAOCULAR LENS) IMPLANT
PROCESS W SPEC LENS (INTRAOCULAR LENS) IMPLANT
RING MALYGIN (MISCELLANEOUS) IMPLANT
SIGHTPATH CAT PROC W REG LENS (Ophthalmic Related) ×2 IMPLANT
SYR TB 1ML LL NO SAFETY (SYRINGE) ×1 IMPLANT
TAPE SURG TRANSPORE 1 IN (GAUZE/BANDAGES/DRESSINGS) IMPLANT
TAPE SURGICAL TRANSPORE 1 IN (GAUZE/BANDAGES/DRESSINGS) ×1
VISCOELASTIC ADDITIONAL (OPHTHALMIC RELATED) IMPLANT
WATER STERILE IRR 250ML POUR (IV SOLUTION) ×1 IMPLANT

## 2012-09-08 NOTE — H&P (Signed)
I have reviewed the H&P, the patient was re-examined, and I have identified no interval changes in medical condition and plan of care since the history and physical of record  

## 2012-09-08 NOTE — Anesthesia Postprocedure Evaluation (Signed)
  Anesthesia Post-op Note  Patient: Anthony Lucas  Procedure(s) Performed: Procedure(s) with comments: CATARACT EXTRACTION PHACO AND INTRAOCULAR LENS PLACEMENT (IOC) (Right) - CDE: 21.28  Patient Location: Short Stay  Anesthesia Type:MAC  Level of Consciousness: awake, alert , oriented and patient cooperative  Airway and Oxygen Therapy: Patient Spontanous Breathing  Post-op Pain: none  Post-op Assessment: Post-op Vital signs reviewed, Patient's Cardiovascular Status Stable, Respiratory Function Stable, Patent Airway and No signs of Nausea or vomiting  Post-op Vital Signs: Reviewed and stable  Complications: No apparent anesthesia complications

## 2012-09-08 NOTE — Transfer of Care (Signed)
Immediate Anesthesia Transfer of Care Note  Patient: Anthony Lucas  Procedure(s) Performed: Procedure(s) with comments: CATARACT EXTRACTION PHACO AND INTRAOCULAR LENS PLACEMENT (IOC) (Right) - CDE: 21.28  Patient Location: Short Stay  Anesthesia Type:MAC  Level of Consciousness: awake, alert , oriented and patient cooperative  Airway & Oxygen Therapy: Patient Spontanous Breathing  Post-op Assessment: Report given to PACU RN and Post -op Vital signs reviewed and stable  Post vital signs: Reviewed and stable  Complications: No apparent anesthesia complications

## 2012-09-08 NOTE — Anesthesia Procedure Notes (Signed)
Procedure Name: MAC Date/Time: 09/08/2012 9:50 AM Performed by: Carolyne Littles, AMY L Pre-anesthesia Checklist: Patient identified, Timeout performed, Emergency Drugs available, Suction available and Patient being monitored Oxygen Delivery Method: Nasal cannula

## 2012-09-08 NOTE — OR Nursing (Signed)
Pt. States that he is suppose to have the right eye done , not the left, he used his drops at home and placed in right eye prior to arrival to surgery. Dr. Alto Denver notified. Procedure changed to have right eye done today.

## 2012-09-08 NOTE — Preoperative (Signed)
Beta Blockers   Reason not to administer Beta Blockers:Not Applicable 

## 2012-09-08 NOTE — Anesthesia Preprocedure Evaluation (Signed)
Anesthesia Evaluation  Patient identified by MRN, date of birth, ID band Patient awake    Reviewed: Allergy & Precautions, H&P , NPO status , Patient's Chart, lab work & pertinent test results, reviewed documented beta blocker date and time   Airway Mallampati: II TM Distance: >3 FB     Dental  (+) Poor Dentition   Pulmonary shortness of breath, sleep apnea ,  breath sounds clear to auscultation        Cardiovascular hypertension, Pt. on medications Rhythm:Regular Rate:Normal     Neuro/Psych    GI/Hepatic   Endo/Other  diabetes, Well Controlled, Type 2, Oral Hypoglycemic Agents  Renal/GU      Musculoskeletal   Abdominal   Peds  Hematology   Anesthesia Other Findings   Reproductive/Obstetrics                           Anesthesia Physical Anesthesia Plan  ASA: III  Anesthesia Plan: MAC   Post-op Pain Management:    Induction: Intravenous  Airway Management Planned: Nasal Cannula  Additional Equipment:   Intra-op Plan:   Post-operative Plan:   Informed Consent: I have reviewed the patients History and Physical, chart, labs and discussed the procedure including the risks, benefits and alternatives for the proposed anesthesia with the patient or authorized representative who has indicated his/her understanding and acceptance.     Plan Discussed with:   Anesthesia Plan Comments: (Case has been changed to Right cataract by pt and surgeon.)        Anesthesia Quick Evaluation

## 2012-09-08 NOTE — Brief Op Note (Signed)
Pre-Op Dx: Cataract OD Post-Op Dx: Cataract OD Surgeon: Erisha Paugh Anesthesia: Topical with MAC Surgery: Cataract Extraction with Intraocular lens Implant OD Implant: B&L enVista Specimen: None Complications: None 

## 2012-09-09 ENCOUNTER — Encounter (HOSPITAL_COMMUNITY): Payer: Self-pay | Admitting: Ophthalmology

## 2012-09-09 NOTE — Op Note (Signed)
NAMEJARMAN, LITTON                  ACCOUNT NO.:  [id number]  MEDICAL RECORD NO.:  [id number]  LOCATION:  [facility]                          FACILITY:  [facility]  PHYSICIAN:  Susanne Greenhouse, MD       DATE OF BIRTH:  Dec 30, 1930  DATE OF PROCEDURE:  09/08/2012 DATE OF DISCHARGE:  09/08/2012                              OPERATIVE REPORT   PREOPERATIVE DIAGNOSIS:  Combined cataract, right eye, diagnosis code 366.19.  POSTOPERATIVE DIAGNOSIS: 1. Combined cataract, right eye, diagnosis code 366.19. 2. Intraoperative floppy iris syndrome, diagnosis code 364.81.  OPERATION PERFORMED:  Phacoemulsification posterior chamber intraocular lens implantation, right eye.  ANESTHESIA:  Topical with monitored anesthesia care and IV sedation.  OPERATIVE SUMMARY:  In the preoperative area, dilating drops were placed into the right eye.  The patient was then brought into the operating room where he was placed under topical anesthesia and IV sedation.  The eye was then prepped and draped.  Beginning with a 75 blade, a paracentesis port was made at the surgeon's 2 o'clock position.  The anterior chamber was then filled with a 1% nonpreserved lidocaine solution with epinephrine.  This was followed by Viscoat to deepen the chamber.  A small fornix-based peritomy was performed superiorly.  Next, a single iris hook was placed through the limbus superiorly.  A 2.4-mm keratome blade was then used to make a clear corneal incision over the iris hook.  A bent cystotome needle and Utrata forceps were used to create a continuous tear capsulotomy.  Hydrodissection was performed using balanced salt solution on a fine cannula.  The lens nucleus was then removed using phacoemulsification in a quadrant cracking technique. The cortical material was then removed with irrigation and aspiration. The capsular bag and anterior chamber were refilled with Provisc.  The wound was widened to approximately 3 mm and a posterior  chamber intraocular lens was placed into the capsular bag without difficulty using an [employer] lens injecting system.  A single 10-0 nylon suture was then used to close the incision as well as stromal hydration. The Provisc was removed from the anterior chamber and capsular bag with irrigation and aspiration.  At this point, the wounds were tested for leak, which were negative.  The anterior chamber remained deep and stable.  The patient tolerated the procedure well.  There were no operative complications, and he awoke from topical anesthesia and IV sedation without problem.  No surgical specimens.  Prosthetic device used is a Bausch and Lomb posterior chamber lens, model MX60, power of 21.0, serial number is [id number].          ______________________________ Susanne Greenhouse, MD     KEH/MEDQ  D:  09/09/2012  T:  09/09/2012  Job:  [id number]

## 2012-09-27 DIAGNOSIS — E119 Type 2 diabetes mellitus without complications: Secondary | ICD-10-CM | POA: Diagnosis not present

## 2012-09-27 DIAGNOSIS — E1149 Type 2 diabetes mellitus with other diabetic neurological complication: Secondary | ICD-10-CM | POA: Diagnosis not present

## 2012-10-17 ENCOUNTER — Other Ambulatory Visit: Payer: Self-pay | Admitting: Family Medicine

## 2012-11-28 DIAGNOSIS — H2589 Other age-related cataract: Secondary | ICD-10-CM | POA: Diagnosis not present

## 2012-12-01 ENCOUNTER — Telehealth: Payer: Self-pay | Admitting: *Deleted

## 2012-12-01 DIAGNOSIS — H2510 Age-related nuclear cataract, unspecified eye: Secondary | ICD-10-CM | POA: Diagnosis not present

## 2012-12-01 DIAGNOSIS — Z86010 Personal history of colonic polyps: Secondary | ICD-10-CM

## 2012-12-01 NOTE — Telephone Encounter (Signed)
Mailed pt a letter and routed it to Dr Gerda Diss, for pt to have an stool for occult blood test.

## 2012-12-01 NOTE — Telephone Encounter (Signed)
Message copied by Florene Glen on Thu Dec 01, 2012  8:10 AM ------      Message from: Florene Glen      Created: Wed Dec 02, 2011 11:06 AM       Yearly hemoccult per 2012 procedure ------

## 2012-12-02 ENCOUNTER — Encounter (HOSPITAL_COMMUNITY): Payer: Self-pay

## 2012-12-02 ENCOUNTER — Encounter (HOSPITAL_COMMUNITY)
Admission: RE | Admit: 2012-12-02 | Discharge: 2012-12-02 | Disposition: A | Discharge status: Home / Self Care | Payer: Medicare Other | Source: Ambulatory Visit | Attending: Ophthalmology | Admitting: Ophthalmology

## 2012-12-02 ENCOUNTER — Encounter (HOSPITAL_COMMUNITY): Payer: Self-pay | Admitting: Pharmacy Technician

## 2012-12-02 MED ORDER — CYCLOPENTOLATE-PHENYLEPHRINE 0.2-1 % OP SOLN: 1.0000 [drp] | OPHTHALMIC | Status: DC

## 2012-12-02 MED ORDER — FENTANYL CITRATE 0.05 MG/ML IJ SOLN: 25.0000 ug | INTRAMUSCULAR | Status: DC | PRN

## 2012-12-02 MED ORDER — LIDOCAINE HCL 3.5 % OP GEL: 1.0000 "application " | Freq: Once | OPHTHALMIC | Status: DC

## 2012-12-02 MED ORDER — TETRACAINE HCL 0.5 % OP SOLN: 1.0000 [drp] | OPHTHALMIC | Status: DC

## 2012-12-02 MED ORDER — PHENYLEPHRINE HCL 2.5 % OP SOLN: 1.0000 [drp] | OPHTHALMIC | Status: DC

## 2012-12-02 MED ORDER — ONDANSETRON HCL 4 MG/2ML IJ SOLN: 4.0000 mg | Freq: Once | INTRAMUSCULAR | Status: AC | PRN

## 2012-12-06 ENCOUNTER — Encounter (HOSPITAL_COMMUNITY): Payer: Self-pay | Admitting: Pharmacy Technician

## 2012-12-07 MED ORDER — LIDOCAINE HCL (PF) 1 % IJ SOLN
INTRAMUSCULAR | Status: AC
  Filled 2012-12-07: qty 2

## 2012-12-07 MED ORDER — NEOMYCIN-POLYMYXIN-DEXAMETH 3.5-10000-0.1 OP OINT
TOPICAL_OINTMENT | OPHTHALMIC | Status: AC
  Filled 2012-12-07: qty 3.5

## 2012-12-07 MED ORDER — TETRACAINE HCL 0.5 % OP SOLN
OPHTHALMIC | Status: AC
  Filled 2012-12-07: qty 2

## 2012-12-07 MED ORDER — LIDOCAINE HCL 3.5 % OP GEL
OPHTHALMIC | Status: AC
  Filled 2012-12-07: qty 5

## 2012-12-07 MED ORDER — CYCLOPENTOLATE-PHENYLEPHRINE OP SOLN OPTIME - NO CHARGE
OPHTHALMIC | Status: AC
  Filled 2012-12-07: qty 2

## 2012-12-08 ENCOUNTER — Ambulatory Visit (HOSPITAL_COMMUNITY)
Admission: RE | Admit: 2012-12-08 | Discharge: 2012-12-08 | Disposition: A | Discharge status: Home / Self Care | Payer: Medicare Other | Source: Ambulatory Visit | Attending: Ophthalmology | Admitting: Ophthalmology

## 2012-12-08 ENCOUNTER — Encounter (HOSPITAL_COMMUNITY): Payer: Self-pay | Admitting: Anesthesiology

## 2012-12-08 ENCOUNTER — Encounter (HOSPITAL_COMMUNITY)
Admission: RE | Disposition: A | Discharge status: Home / Self Care | Payer: Self-pay | Source: Ambulatory Visit | Attending: Ophthalmology

## 2012-12-08 ENCOUNTER — Encounter (HOSPITAL_COMMUNITY): Payer: Self-pay | Admitting: *Deleted

## 2012-12-08 ENCOUNTER — Ambulatory Visit (HOSPITAL_COMMUNITY): Payer: Medicare Other | Admitting: Anesthesiology

## 2012-12-08 DIAGNOSIS — H269 Unspecified cataract: Secondary | ICD-10-CM | POA: Diagnosis not present

## 2012-12-08 DIAGNOSIS — H2589 Other age-related cataract: Secondary | ICD-10-CM | POA: Insufficient documentation

## 2012-12-08 DIAGNOSIS — E119 Type 2 diabetes mellitus without complications: Secondary | ICD-10-CM | POA: Insufficient documentation

## 2012-12-08 DIAGNOSIS — I10 Essential (primary) hypertension: Secondary | ICD-10-CM | POA: Diagnosis not present

## 2012-12-08 HISTORY — PX: CATARACT EXTRACTION W/PHACO: SHX586

## 2012-12-08 SURGERY — PHACOEMULSIFICATION, CATARACT, WITH IOL INSERTION
Anesthesia: Monitor Anesthesia Care | Site: Eye | Laterality: Left | Wound class: Clean

## 2012-12-08 MED ORDER — NEOMYCIN-POLYMYXIN-DEXAMETH 0.1 % OP OINT
TOPICAL_OINTMENT | OPHTHALMIC | Status: DC | PRN
  Administered 2012-12-08: 1 via OPHTHALMIC

## 2012-12-08 MED ORDER — MIDAZOLAM HCL 2 MG/2ML IJ SOLN
1.0000 mg | INTRAMUSCULAR | Status: DC | PRN
  Administered 2012-12-08 (×2): 2 mg via INTRAVENOUS

## 2012-12-08 MED ORDER — EPINEPHRINE HCL 1 MG/ML IJ SOLN
INTRAOCULAR | Status: DC | PRN
  Administered 2012-12-08: 11:00:00

## 2012-12-08 MED ORDER — EPINEPHRINE HCL 1 MG/ML IJ SOLN
INTRAMUSCULAR | Status: AC
  Filled 2012-12-08: qty 1

## 2012-12-08 MED ORDER — POVIDONE-IODINE 5 % OP SOLN
OPHTHALMIC | Status: DC | PRN
  Administered 2012-12-08: 1 via OPHTHALMIC

## 2012-12-08 MED ORDER — ONDANSETRON HCL 4 MG/2ML IJ SOLN: 4.0000 mg | Freq: Once | INTRAMUSCULAR | Status: DC | PRN

## 2012-12-08 MED ORDER — TETRACAINE HCL 0.5 % OP SOLN
1.0000 [drp] | OPHTHALMIC | Status: AC
  Administered 2012-12-08 (×3): 1 [drp] via OPHTHALMIC

## 2012-12-08 MED ORDER — MIDAZOLAM HCL 2 MG/2ML IJ SOLN
INTRAMUSCULAR | Status: AC
  Filled 2012-12-08: qty 2

## 2012-12-08 MED ORDER — FENTANYL CITRATE 0.05 MG/ML IJ SOLN: 25.0000 ug | INTRAMUSCULAR | Status: DC | PRN

## 2012-12-08 MED ORDER — LIDOCAINE HCL 3.5 % OP GEL
1.0000 "application " | Freq: Once | OPHTHALMIC | Status: AC
  Administered 2012-12-08: 1 via OPHTHALMIC

## 2012-12-08 MED ORDER — LACTATED RINGERS IV SOLN
INTRAVENOUS | Status: DC
  Administered 2012-12-08: 10:00:00 via INTRAVENOUS

## 2012-12-08 MED ORDER — LIDOCAINE 3.5 % OP GEL OPTIME - NO CHARGE
OPHTHALMIC | Status: DC | PRN
  Administered 2012-12-08: 1 [drp] via OPHTHALMIC

## 2012-12-08 MED ORDER — PROVISC 10 MG/ML IO SOLN
INTRAOCULAR | Status: DC | PRN
  Administered 2012-12-08: 8.5 mg via INTRAOCULAR

## 2012-12-08 MED ORDER — BSS IO SOLN
INTRAOCULAR | Status: DC | PRN
  Administered 2012-12-08: 15 mL via INTRAOCULAR

## 2012-12-08 MED ORDER — PHENYLEPHRINE HCL 2.5 % OP SOLN
1.0000 [drp] | OPHTHALMIC | Status: AC
  Administered 2012-12-08 (×3): 1 [drp] via OPHTHALMIC

## 2012-12-08 MED ORDER — CYCLOPENTOLATE-PHENYLEPHRINE 0.2-1 % OP SOLN
1.0000 [drp] | OPHTHALMIC | Status: AC
  Administered 2012-12-08 (×3): 1 [drp] via OPHTHALMIC

## 2012-12-08 MED ORDER — LIDOCAINE HCL (PF) 1 % IJ SOLN
INTRAOCULAR | Status: DC | PRN
  Administered 2012-12-08: 11:00:00 via OPHTHALMIC

## 2012-12-08 SURGICAL SUPPLY — 32 items
CAPSULAR TENSION RING-AMO (OPHTHALMIC RELATED) IMPLANT
CLOTH BEACON ORANGE TIMEOUT ST (SAFETY) ×1 IMPLANT
EYE SHIELD UNIVERSAL CLEAR (GAUZE/BANDAGES/DRESSINGS) ×1 IMPLANT
GLOVE BIO SURGEON STRL SZ 6.5 (GLOVE) IMPLANT
GLOVE BIOGEL PI IND STRL 6.5 (GLOVE) IMPLANT
GLOVE BIOGEL PI IND STRL 7.0 (GLOVE) IMPLANT
GLOVE BIOGEL PI IND STRL 7.5 (GLOVE) IMPLANT
GLOVE BIOGEL PI INDICATOR 6.5 (GLOVE) ×2
GLOVE BIOGEL PI INDICATOR 7.0 (GLOVE)
GLOVE BIOGEL PI INDICATOR 7.5 (GLOVE)
GLOVE ECLIPSE 6.5 STRL STRAW (GLOVE) IMPLANT
GLOVE ECLIPSE 7.0 STRL STRAW (GLOVE) IMPLANT
GLOVE ECLIPSE 7.5 STRL STRAW (GLOVE) IMPLANT
GLOVE EXAM NITRILE LRG STRL (GLOVE) IMPLANT
GLOVE EXAM NITRILE MD LF STRL (GLOVE) IMPLANT
GLOVE SKINSENSE NS SZ6.5 (GLOVE)
GLOVE SKINSENSE NS SZ7.0 (GLOVE)
GLOVE SKINSENSE STRL SZ6.5 (GLOVE) IMPLANT
GLOVE SKINSENSE STRL SZ7.0 (GLOVE) IMPLANT
KIT VITRECTOMY (OPHTHALMIC RELATED) IMPLANT
PAD ARMBOARD 7.5X6 YLW CONV (MISCELLANEOUS) ×1 IMPLANT
PROC W NO LENS (INTRAOCULAR LENS)
PROC W SPEC LENS (INTRAOCULAR LENS)
PROCESS W NO LENS (INTRAOCULAR LENS) IMPLANT
PROCESS W SPEC LENS (INTRAOCULAR LENS) IMPLANT
RING MALYGIN (MISCELLANEOUS) IMPLANT
SIGHTPATH CAT PROC W REG LENS (Ophthalmic Related) ×2 IMPLANT
SYR TB 1ML LL NO SAFETY (SYRINGE) ×1 IMPLANT
TAPE SURG TRANSPORE 1 IN (GAUZE/BANDAGES/DRESSINGS) IMPLANT
TAPE SURGICAL TRANSPORE 1 IN (GAUZE/BANDAGES/DRESSINGS) ×1
VISCOELASTIC ADDITIONAL (OPHTHALMIC RELATED) IMPLANT
WATER STERILE IRR 250ML POUR (IV SOLUTION) ×1 IMPLANT

## 2012-12-08 NOTE — OR Nursing (Signed)
Last dose of metoprolol 9/3 at 0800.  Dr. Jayme Cloud aware and patient instructed to take daily dose when he arrives home.

## 2012-12-08 NOTE — Anesthesia Postprocedure Evaluation (Signed)
  Anesthesia Post-op Note  Patient: Anthony Lucas  Procedure(s) Performed: Procedure(s) with comments: CATARACT EXTRACTION PHACO AND INTRAOCULAR LENS PLACEMENT (IOC) (Left) - CDE:25.72  Patient Location: PACU and Short Stay  Anesthesia Type:MAC  Level of Consciousness: awake, alert  and oriented  Airway and Oxygen Therapy: Patient Spontanous Breathing  Post-op Pain: none  Post-op Assessment: Post-op Vital signs reviewed, Patient's Cardiovascular Status Stable, Respiratory Function Stable, Patent Airway and No signs of Nausea or vomiting  Post-op Vital Signs: Reviewed and stable  Complications: No apparent anesthesia complications

## 2012-12-08 NOTE — Op Note (Signed)
Date of Admission: 12/08/2012  Date of Surgery: 12/08/2012  Pre-Op Dx: Cataract  Left  Eye  Post-Op Dx: Cataract  Left  Eye,  Dx Code 366.19  Surgeon: Gemma Payor, M.D.  Assistants: None  Anesthesia: Topical with MAC  Indications: Painless, progressive loss of vision with compromise of daily activities.  Surgery: Cataract Extraction with Intraocular lens Implant Left Eye  Discription: The patient had dilating drops and viscous lidocaine placed into the left eye in the pre-op holding area. After transfer to the operating room, a time out was performed. The patient was then prepped and draped. Beginning with a 75 degree blade a paracentesis port was made at the surgeon's 2 o'clock position. The anterior chamber was then filled with 1% non-preserved lidocaine with epinepherine. This was followed by filling the anterior chamber with Provisc. A 2.3mm keratome blade was used to make a clear corneal incision at the temporal limbus. A bent cystatome needle was used to create a continuous tear capsulotomy. Hydrodissection was performed with balanced salt solution on a Fine canula. The lens nucleus was then removed using the phacoemulsification handpiece. Residual cortex was removed with the I&A handpiece. The anterior chamber and capsular bag were refilled with Provisc. A posterior chamber intraocular lens was placed into the capsular bag with it's injector. The implant was positioned with the Kuglan hook. The Provisc was then removed from the anterior chamber and capsular bag with the I&A handpiece. Stromal hydration of the main incision and paracentesis port was performed with BSS on a Fine canula. The wounds were tested for leak which was negative. The patient tolerated the procedure well. There were no operative complications. The patient was then transferred to the recovery room in stable condition.  Complications: None  Specimen: None  EBL: None  Prosthetic device: B&L enVista, MX60, power 21.0D,  SN [id number].

## 2012-12-08 NOTE — Anesthesia Preprocedure Evaluation (Signed)
Anesthesia Evaluation  Patient identified by MRN, date of birth, ID band Patient awake    Reviewed: Allergy & Precautions, H&P , NPO status , Patient's Chart, lab work & pertinent test results, reviewed documented beta blocker date and time   Airway Mallampati: II TM Distance: >3 FB     Dental  (+) Poor Dentition   Pulmonary shortness of breath, sleep apnea ,  breath sounds clear to auscultation        Cardiovascular hypertension, Pt. on medications Rhythm:Regular Rate:Normal     Neuro/Psych    GI/Hepatic   Endo/Other  diabetes, Well Controlled, Type 2, Oral Hypoglycemic Agents  Renal/GU      Musculoskeletal   Abdominal   Peds  Hematology   Anesthesia Other Findings   Reproductive/Obstetrics                           Anesthesia Physical Anesthesia Plan  ASA: III  Anesthesia Plan: MAC   Post-op Pain Management:    Induction: Intravenous  Airway Management Planned: Nasal Cannula  Additional Equipment:   Intra-op Plan:   Post-operative Plan:   Informed Consent: I have reviewed the patients History and Physical, chart, labs and discussed the procedure including the risks, benefits and alternatives for the proposed anesthesia with the patient or authorized representative who has indicated his/her understanding and acceptance.     Plan Discussed with:   Anesthesia Plan Comments: (Case has been changed to Right cataract by pt and surgeon.)        Anesthesia Quick Evaluation

## 2012-12-08 NOTE — Transfer of Care (Signed)
Immediate Anesthesia Transfer of Care Note  Patient: Anthony Lucas  Procedure(s) Performed: Procedure(s) with comments: CATARACT EXTRACTION PHACO AND INTRAOCULAR LENS PLACEMENT (IOC) (Left) - CDE:25.72  Patient Location: PACU and Short Stay  Anesthesia Type:MAC  Level of Consciousness: awake  Airway & Oxygen Therapy: Patient Spontanous Breathing  Post-op Assessment: Report given to PACU RN  Post vital signs: Reviewed  Complications: No apparent anesthesia complications

## 2012-12-08 NOTE — H&P (Signed)
I have reviewed the H&P, the patient was re-examined, and I have identified no interval changes in medical condition and plan of care since the history and physical of record  

## 2012-12-09 ENCOUNTER — Encounter (HOSPITAL_COMMUNITY): Payer: Self-pay | Admitting: Ophthalmology

## 2012-12-09 LAB — GLUCOSE, CAPILLARY: Glucose-Capillary: 136 mg/dL — ABNORMAL HIGH (ref 70–99)

## 2012-12-13 ENCOUNTER — Other Ambulatory Visit: Payer: Self-pay | Admitting: *Deleted

## 2012-12-13 DIAGNOSIS — E1149 Type 2 diabetes mellitus with other diabetic neurological complication: Secondary | ICD-10-CM | POA: Diagnosis not present

## 2012-12-13 DIAGNOSIS — E119 Type 2 diabetes mellitus without complications: Secondary | ICD-10-CM | POA: Diagnosis not present

## 2012-12-13 NOTE — Progress Notes (Signed)
Received a reply from pt after I sent him a letter. Pt had a stool test for occult blood at Dr [facility].

## 2012-12-16 DIAGNOSIS — L988 Other specified disorders of the skin and subcutaneous tissue: Secondary | ICD-10-CM | POA: Diagnosis not present

## 2012-12-16 DIAGNOSIS — L989 Disorder of the skin and subcutaneous tissue, unspecified: Secondary | ICD-10-CM | POA: Diagnosis not present

## 2012-12-16 DIAGNOSIS — D235 Other benign neoplasm of skin of trunk: Secondary | ICD-10-CM | POA: Diagnosis not present

## 2012-12-16 DIAGNOSIS — L909 Atrophic disorder of skin, unspecified: Secondary | ICD-10-CM | POA: Diagnosis not present

## 2012-12-21 DIAGNOSIS — Z23 Encounter for immunization: Secondary | ICD-10-CM | POA: Diagnosis not present

## 2012-12-21 DIAGNOSIS — L570 Actinic keratosis: Secondary | ICD-10-CM | POA: Diagnosis not present

## 2012-12-21 DIAGNOSIS — L578 Other skin changes due to chronic exposure to nonionizing radiation: Secondary | ICD-10-CM | POA: Diagnosis not present

## 2013-01-13 ENCOUNTER — Other Ambulatory Visit: Payer: Self-pay | Admitting: Family Medicine

## 2013-01-23 ENCOUNTER — Other Ambulatory Visit: Payer: Self-pay | Admitting: Family Medicine

## 2013-02-07 ENCOUNTER — Ambulatory Visit (INDEPENDENT_AMBULATORY_CARE_PROVIDER_SITE_OTHER): Payer: Medicare Other | Admitting: Urology

## 2013-02-07 ENCOUNTER — Encounter (INDEPENDENT_AMBULATORY_CARE_PROVIDER_SITE_OTHER): Payer: Self-pay

## 2013-02-07 DIAGNOSIS — R310 Gross hematuria: Secondary | ICD-10-CM | POA: Diagnosis not present

## 2013-02-07 DIAGNOSIS — D4959 Neoplasm of unspecified behavior of other genitourinary organ: Secondary | ICD-10-CM | POA: Diagnosis not present

## 2013-02-13 ENCOUNTER — Other Ambulatory Visit: Payer: Self-pay | Admitting: Family Medicine

## 2013-02-14 DIAGNOSIS — E1149 Type 2 diabetes mellitus with other diabetic neurological complication: Secondary | ICD-10-CM | POA: Diagnosis not present

## 2013-02-14 DIAGNOSIS — E119 Type 2 diabetes mellitus without complications: Secondary | ICD-10-CM | POA: Diagnosis not present

## 2013-02-22 ENCOUNTER — Other Ambulatory Visit: Payer: Self-pay | Admitting: Family Medicine

## 2013-03-15 ENCOUNTER — Other Ambulatory Visit: Payer: Self-pay | Admitting: Family Medicine

## 2013-03-15 DIAGNOSIS — I781 Nevus, non-neoplastic: Secondary | ICD-10-CM | POA: Diagnosis not present

## 2013-03-15 DIAGNOSIS — L570 Actinic keratosis: Secondary | ICD-10-CM | POA: Diagnosis not present

## 2013-03-15 DIAGNOSIS — L821 Other seborrheic keratosis: Secondary | ICD-10-CM | POA: Diagnosis not present

## 2013-03-15 DIAGNOSIS — D235 Other benign neoplasm of skin of trunk: Secondary | ICD-10-CM | POA: Diagnosis not present

## 2013-03-24 ENCOUNTER — Other Ambulatory Visit: Payer: Self-pay | Admitting: Family Medicine

## 2013-04-15 ENCOUNTER — Other Ambulatory Visit: Payer: Self-pay | Admitting: Family Medicine

## 2013-04-21 ENCOUNTER — Other Ambulatory Visit: Payer: Self-pay | Admitting: Family Medicine

## 2013-04-25 DIAGNOSIS — L851 Acquired keratosis [keratoderma] palmaris et plantaris: Secondary | ICD-10-CM | POA: Diagnosis not present

## 2013-04-25 DIAGNOSIS — E1149 Type 2 diabetes mellitus with other diabetic neurological complication: Secondary | ICD-10-CM | POA: Diagnosis not present

## 2013-04-25 DIAGNOSIS — B351 Tinea unguium: Secondary | ICD-10-CM | POA: Diagnosis not present

## 2013-05-05 ENCOUNTER — Telehealth: Payer: Self-pay | Admitting: Family Medicine

## 2013-05-05 MED ORDER — MECLIZINE HCL 25 MG PO TABS: 25.0000 mg | ORAL_TABLET | Freq: Four times a day (QID) | ORAL | Status: DC | PRN

## 2013-05-05 MED ORDER — ONDANSETRON HCL 8 MG PO TABS: 8.0000 mg | ORAL_TABLET | Freq: Three times a day (TID) | ORAL | Status: DC | PRN

## 2013-05-05 NOTE — Telephone Encounter (Signed)
Patient has vertigo and would like something called in for this.    [employer]

## 2013-05-05 NOTE — Telephone Encounter (Signed)
meds sent to pharm. Pt's wife notified.

## 2013-05-05 NOTE — Telephone Encounter (Signed)
Meclizine 25 mg , # 30, 1 qid prn dizziness, 1 refill zofran 8 mg one tid prn nausea, #21 Recheck here if persistant or worse

## 2013-06-06 ENCOUNTER — Encounter: Payer: Self-pay | Admitting: Nurse Practitioner

## 2013-06-06 ENCOUNTER — Ambulatory Visit (INDEPENDENT_AMBULATORY_CARE_PROVIDER_SITE_OTHER): Payer: Medicare Other | Admitting: Nurse Practitioner

## 2013-06-06 VITALS — BP 122/86 | Ht 73.0 in | Wt 226.6 lb

## 2013-06-06 DIAGNOSIS — IMO0002 Reserved for concepts with insufficient information to code with codable children: Secondary | ICD-10-CM | POA: Insufficient documentation

## 2013-06-06 DIAGNOSIS — L578 Other skin changes due to chronic exposure to nonionizing radiation: Secondary | ICD-10-CM

## 2013-06-06 DIAGNOSIS — Q809 Congenital ichthyosis, unspecified: Secondary | ICD-10-CM | POA: Diagnosis not present

## 2013-06-06 MED ORDER — AZITHROMYCIN 250 MG PO TABS: ORAL_TABLET | ORAL | Status: DC

## 2013-06-06 NOTE — Progress Notes (Signed)
Subjective:  Presents for complaints of diffuse itching in both of his hands. Has regular followup with his dermatologist, had a visit recently. Was prescribed a cream which has not helped. Patient does extremely hot water on his hands which relieves symptoms for few days. States he has this problem every few years which is relieved with a Z-Pak. PMH includes squamous cell carcinoma and significant sun damage. Also has a healing superficial laceration on the dorsum of the left hand lateral side where he hit it a few days ago.  Objective:   BP 122/86  Ht 6\' 1"  (1.854 m)  Wt 226 lb 9.6 oz (102.785 kg)  BMI 29.90 kg/m2 NAD. Alert, oriented. Lungs clear. Heart regular rate rhythm. Hands in general are extremely dry with obvious sun damage. Healing superficial lesion noted on the dorsum of the left hand. No signs of infection.  Assessment:Ichthyosis  Diffuse photodamage of skin  Plan: Meds ordered this encounter  Medications  . azithromycin (ZITHROMAX Z-PAK) 250 MG tablet    Sig: Take 2 tablets (500 mg) on  Day 1,  followed by 1 tablet (250 mg) once daily on Days 2 through 5.    Dispense:  6 each    Refill:  0    Order Specific Question:  Supervising Provider    Answer:  Mikey Kirschner [[id number]]   Continue measures as discussed by dermatologist. Call back if symptoms worsen or persist.

## 2013-06-21 ENCOUNTER — Other Ambulatory Visit: Payer: Self-pay | Admitting: Family Medicine

## 2013-07-06 DIAGNOSIS — E119 Type 2 diabetes mellitus without complications: Secondary | ICD-10-CM | POA: Diagnosis not present

## 2013-07-06 DIAGNOSIS — E1149 Type 2 diabetes mellitus with other diabetic neurological complication: Secondary | ICD-10-CM | POA: Diagnosis not present

## 2013-07-13 ENCOUNTER — Other Ambulatory Visit: Payer: Self-pay | Admitting: Family Medicine

## 2013-08-21 ENCOUNTER — Other Ambulatory Visit: Payer: Self-pay | Admitting: Family Medicine

## 2013-09-19 DIAGNOSIS — L851 Acquired keratosis [keratoderma] palmaris et plantaris: Secondary | ICD-10-CM | POA: Diagnosis not present

## 2013-09-19 DIAGNOSIS — B351 Tinea unguium: Secondary | ICD-10-CM | POA: Diagnosis not present

## 2013-09-19 DIAGNOSIS — E1149 Type 2 diabetes mellitus with other diabetic neurological complication: Secondary | ICD-10-CM | POA: Diagnosis not present

## 2013-10-10 ENCOUNTER — Other Ambulatory Visit: Payer: Self-pay | Admitting: Family Medicine

## 2013-10-10 DIAGNOSIS — L678 Other hair color and hair shaft abnormalities: Secondary | ICD-10-CM | POA: Diagnosis not present

## 2013-10-10 DIAGNOSIS — D235 Other benign neoplasm of skin of trunk: Secondary | ICD-10-CM | POA: Diagnosis not present

## 2013-10-10 DIAGNOSIS — L570 Actinic keratosis: Secondary | ICD-10-CM | POA: Diagnosis not present

## 2013-10-10 DIAGNOSIS — L578 Other skin changes due to chronic exposure to nonionizing radiation: Secondary | ICD-10-CM | POA: Diagnosis not present

## 2013-10-10 DIAGNOSIS — L738 Other specified follicular disorders: Secondary | ICD-10-CM | POA: Diagnosis not present

## 2013-11-09 ENCOUNTER — Other Ambulatory Visit: Payer: Self-pay | Admitting: Family Medicine

## 2013-11-17 ENCOUNTER — Other Ambulatory Visit: Payer: Self-pay | Admitting: Family Medicine

## 2013-11-28 DIAGNOSIS — B351 Tinea unguium: Secondary | ICD-10-CM | POA: Diagnosis not present

## 2013-11-28 DIAGNOSIS — E1149 Type 2 diabetes mellitus with other diabetic neurological complication: Secondary | ICD-10-CM | POA: Diagnosis not present

## 2013-11-28 DIAGNOSIS — L851 Acquired keratosis [keratoderma] palmaris et plantaris: Secondary | ICD-10-CM | POA: Diagnosis not present

## 2013-12-11 ENCOUNTER — Other Ambulatory Visit: Payer: Self-pay | Admitting: Family Medicine

## 2013-12-18 ENCOUNTER — Other Ambulatory Visit: Payer: Self-pay | Admitting: Family Medicine

## 2013-12-21 ENCOUNTER — Other Ambulatory Visit: Payer: Self-pay | Admitting: Family Medicine

## 2014-01-11 ENCOUNTER — Other Ambulatory Visit: Payer: Self-pay | Admitting: Family Medicine

## 2014-01-18 ENCOUNTER — Other Ambulatory Visit: Payer: Self-pay | Admitting: Family Medicine

## 2014-01-18 ENCOUNTER — Encounter: Payer: Self-pay | Admitting: Family Medicine

## 2014-01-18 ENCOUNTER — Ambulatory Visit (INDEPENDENT_AMBULATORY_CARE_PROVIDER_SITE_OTHER): Payer: Medicare Other | Admitting: Family Medicine

## 2014-01-18 VITALS — BP 120/84 | Ht 73.0 in | Wt 225.0 lb

## 2014-01-18 DIAGNOSIS — E785 Hyperlipidemia, unspecified: Secondary | ICD-10-CM

## 2014-01-18 DIAGNOSIS — M1000 Idiopathic gout, unspecified site: Secondary | ICD-10-CM | POA: Diagnosis not present

## 2014-01-18 DIAGNOSIS — M109 Gout, unspecified: Secondary | ICD-10-CM | POA: Insufficient documentation

## 2014-01-18 DIAGNOSIS — I10 Essential (primary) hypertension: Secondary | ICD-10-CM

## 2014-01-18 DIAGNOSIS — I499 Cardiac arrhythmia, unspecified: Secondary | ICD-10-CM

## 2014-01-18 DIAGNOSIS — E119 Type 2 diabetes mellitus without complications: Secondary | ICD-10-CM | POA: Diagnosis not present

## 2014-01-18 DIAGNOSIS — Z23 Encounter for immunization: Secondary | ICD-10-CM | POA: Diagnosis not present

## 2014-01-18 DIAGNOSIS — Z79899 Other long term (current) drug therapy: Secondary | ICD-10-CM

## 2014-01-18 LAB — POCT GLYCOSYLATED HEMOGLOBIN (HGB A1C): Hemoglobin A1C: 6.3

## 2014-01-18 MED ORDER — POTASSIUM CHLORIDE ER 10 MEQ PO TBCR: EXTENDED_RELEASE_TABLET | ORAL | Status: DC

## 2014-01-18 MED ORDER — ALLOPURINOL 100 MG PO TABS: ORAL_TABLET | ORAL | Status: DC

## 2014-01-18 MED ORDER — METFORMIN HCL 500 MG PO TABS: ORAL_TABLET | ORAL | Status: DC

## 2014-01-18 MED ORDER — SIMVASTATIN 40 MG PO TABS: ORAL_TABLET | ORAL | Status: DC

## 2014-01-18 MED ORDER — METOPROLOL SUCCINATE ER 50 MG PO TB24: ORAL_TABLET | ORAL | Status: DC

## 2014-01-18 MED ORDER — HYDROCHLOROTHIAZIDE 25 MG PO TABS: ORAL_TABLET | ORAL | Status: DC

## 2014-01-18 NOTE — Progress Notes (Signed)
   Subjective:    Patient ID: Anthony Lucas, male    DOB: 1930-12-27, 80 y.o.   MRN: [id number]  Diabetes He presents for his follow-up diabetic visit. He has type 2 diabetes mellitus. His disease course has been stable. There are no hypoglycemic associated symptoms. Pertinent negatives for hypoglycemia include no confusion or headaches. There are no diabetic associated symptoms. Pertinent negatives for diabetes include no chest pain, no fatigue, no polydipsia, no polyphagia and no weakness. There are no hypoglycemic complications. Symptoms are stable. There are no diabetic complications. There are no known risk factors for coronary artery disease. Current diabetic treatment includes oral agent (monotherapy). He is compliant with treatment all of the time.   Patient states he has no concerns at this time.  Nasal discharge, no fever, started yesterday  Complains knee pain- aches daily, not bad at night ( ortho Dr Durward Fortes)   Review of Systems  Constitutional: Negative for activity change, appetite change and fatigue.  HENT: Negative for congestion.   Respiratory: Negative for cough.   Cardiovascular: Negative for chest pain.  Gastrointestinal: Negative for abdominal pain.  Endocrine: Negative for polydipsia and polyphagia.  Musculoskeletal: Positive for arthralgias.  Skin: Negative for rash.  Neurological: Negative for weakness and headaches.  Psychiatric/Behavioral: Negative for confusion.       Objective:   Physical Exam  Vitals reviewed. Constitutional: He appears well-nourished. No distress.  HENT:  Head: Normocephalic and atraumatic.  Right Ear: External ear normal.  Left Ear: External ear normal.  Cardiovascular: Normal rate and normal heart sounds.   No murmur heard. Pulmonary/Chest: Effort normal and breath sounds normal. No respiratory distress.  Abdominal: Soft.  Musculoskeletal: He exhibits no edema.  Crepitus noted in the right knee  Lymphadenopathy:    He has no  cervical adenopathy.  Neurological: He is alert.  Skin: Skin is warm and dry. No rash noted.  Psychiatric: His behavior is normal.          Assessment & Plan:  Right knee arthritis patient does not want to have surgery currently uses OTC measures. Gout stable check lab HTN stable continue current medications check labs Diabetes good control continue metformin followup 6 months Occasional PVC no need to refer currently At his age the risk of bleeding outweighs the benefit of aspirin

## 2014-01-23 DIAGNOSIS — M1000 Idiopathic gout, unspecified site: Secondary | ICD-10-CM | POA: Diagnosis not present

## 2014-01-23 DIAGNOSIS — Z79899 Other long term (current) drug therapy: Secondary | ICD-10-CM | POA: Diagnosis not present

## 2014-01-23 DIAGNOSIS — E785 Hyperlipidemia, unspecified: Secondary | ICD-10-CM | POA: Diagnosis not present

## 2014-01-23 LAB — BASIC METABOLIC PANEL
BUN: 17 mg/dL (ref 6–23)
CALCIUM: 9.3 mg/dL (ref 8.4–10.5)
CO2: 29 mEq/L (ref 19–32)
Chloride: 102 mEq/L (ref 96–112)
Creat: 1.17 mg/dL (ref 0.50–1.35)
Glucose, Bld: 135 mg/dL — ABNORMAL HIGH (ref 70–99)
Potassium: 5 mEq/L (ref 3.5–5.3)
SODIUM: 140 meq/L (ref 135–145)

## 2014-01-23 LAB — HEPATIC FUNCTION PANEL
ALBUMIN: 3.9 g/dL (ref 3.5–5.2)
ALT: 23 U/L (ref 0–53)
AST: 15 U/L (ref 0–37)
Alkaline Phosphatase: 45 U/L (ref 39–117)
BILIRUBIN DIRECT: 0.2 mg/dL (ref 0.0–0.3)
Indirect Bilirubin: 0.6 mg/dL (ref 0.2–1.2)
TOTAL PROTEIN: 6.5 g/dL (ref 6.0–8.3)
Total Bilirubin: 0.8 mg/dL (ref 0.2–1.2)

## 2014-01-23 LAB — LIPID PANEL
CHOL/HDL RATIO: 3.4 ratio
CHOLESTEROL: 150 mg/dL (ref 0–200)
HDL: 44 mg/dL (ref >39)
LDL Cholesterol: 82 mg/dL (ref 0–99)
TRIGLYCERIDES: 121 mg/dL (ref <150)
VLDL: 24 mg/dL (ref 0–40)

## 2014-01-23 LAB — URIC ACID: URIC ACID, SERUM: 6.4 mg/dL (ref 4.0–7.8)

## 2014-01-31 ENCOUNTER — Encounter: Payer: Self-pay | Admitting: Family Medicine

## 2014-01-31 ENCOUNTER — Ambulatory Visit (INDEPENDENT_AMBULATORY_CARE_PROVIDER_SITE_OTHER): Payer: Medicare Other | Admitting: Family Medicine

## 2014-01-31 VITALS — BP 128/70 | Temp 98.1°F | Ht 73.0 in | Wt 223.0 lb

## 2014-01-31 DIAGNOSIS — J019 Acute sinusitis, unspecified: Secondary | ICD-10-CM

## 2014-01-31 MED ORDER — AMOXICILLIN 500 MG PO TABS: 500.0000 mg | ORAL_TABLET | Freq: Three times a day (TID) | ORAL | Status: DC

## 2014-01-31 NOTE — Progress Notes (Signed)
   Subjective:    Patient ID: Anthony Lucas, male    DOB: [birth year], 80 y.o.   MRN: [id number]  Cough This is a new problem. Episode onset: 3 days ago. The problem has been gradually worsening. The problem occurs every few hours. The cough is non-productive. Associated symptoms include postnasal drip and rhinorrhea. Pertinent negatives include no chest pain, ear pain, fever, shortness of breath or wheezing. The symptoms are aggravated by lying down and pollens. He has tried body position changes (Claritin and cough drops) for the symptoms. The treatment provided mild relief. His past medical history is significant for environmental allergies.      Review of Systems  Constitutional: Positive for fatigue. Negative for fever and activity change.  HENT: Positive for congestion, postnasal drip and rhinorrhea. Negative for ear pain.   Eyes: Negative for discharge.  Respiratory: Positive for cough. Negative for shortness of breath and wheezing.   Cardiovascular: Negative for chest pain.  Gastrointestinal: Positive for nausea. Negative for vomiting.  Allergic/Immunologic: Positive for environmental allergies.       Objective:   Physical Exam  Nursing note and vitals reviewed. Constitutional: He appears well-developed.  HENT:  Head: Normocephalic.  Mouth/Throat: Oropharynx is clear and moist. No oropharyngeal exudate.  Neck: Normal range of motion.  Cardiovascular: Normal rate, regular rhythm and normal heart sounds.   No murmur heard. Pulmonary/Chest: Effort normal and breath sounds normal. He has no wheezes.  Lymphadenopathy:    He has no cervical adenopathy.  Neurological: He exhibits normal muscle tone.  Skin: Skin is warm and dry.          Assessment & Plan:  Viral syndrome secondary sinusitis antibiotics prescribed warning signs discussed I find no evidence of pneumonia don't recommend x-rays or blood work currently monitor closely if worse over the next several days may need  x-ray may need other intervention

## 2014-01-31 NOTE — Patient Instructions (Signed)
If fevers or increase shortness of breath please call  Obviously go to ER if significant worsening

## 2014-02-06 DIAGNOSIS — B351 Tinea unguium: Secondary | ICD-10-CM | POA: Diagnosis not present

## 2014-02-06 DIAGNOSIS — E1142 Type 2 diabetes mellitus with diabetic polyneuropathy: Secondary | ICD-10-CM | POA: Diagnosis not present

## 2014-02-06 DIAGNOSIS — L851 Acquired keratosis [keratoderma] palmaris et plantaris: Secondary | ICD-10-CM | POA: Diagnosis not present

## 2014-02-27 DIAGNOSIS — E1342 Other specified diabetes mellitus with diabetic polyneuropathy: Secondary | ICD-10-CM | POA: Diagnosis not present

## 2014-03-28 ENCOUNTER — Encounter: Payer: Self-pay | Admitting: Family Medicine

## 2014-03-28 ENCOUNTER — Ambulatory Visit (INDEPENDENT_AMBULATORY_CARE_PROVIDER_SITE_OTHER): Payer: Medicare Other | Admitting: Family Medicine

## 2014-03-28 VITALS — BP 110/72 | Ht 71.0 in

## 2014-03-28 DIAGNOSIS — R319 Hematuria, unspecified: Secondary | ICD-10-CM

## 2014-03-28 LAB — CBC WITH DIFFERENTIAL/PLATELET
Basophils Absolute: 0 10*3/uL (ref 0.0–0.1)
Basophils Relative: 0 % (ref 0–1)
EOS PCT: 0 % (ref 0–5)
Eosinophils Absolute: 0 10*3/uL (ref 0.0–0.7)
HCT: 42.2 % (ref 39.0–52.0)
HEMOGLOBIN: 14.6 g/dL (ref 13.0–17.0)
LYMPHS ABS: 1.1 10*3/uL (ref 0.7–4.0)
Lymphocytes Relative: 7 % — ABNORMAL LOW (ref 12–46)
MCH: 27.9 pg (ref 26.0–34.0)
MCHC: 34.6 g/dL (ref 30.0–36.0)
MCV: 80.5 fL (ref 78.0–100.0)
MPV: 10.3 fL (ref 9.4–12.4)
Monocytes Absolute: 1.1 10*3/uL — ABNORMAL HIGH (ref 0.1–1.0)
Monocytes Relative: 7 % (ref 3–12)
Neutro Abs: 13.2 10*3/uL — ABNORMAL HIGH (ref 1.7–7.7)
Neutrophils Relative %: 86 % — ABNORMAL HIGH (ref 43–77)
Platelets: 173 10*3/uL (ref 150–400)
RBC: 5.24 MIL/uL (ref 4.22–5.81)
RDW: 14 % (ref 11.5–15.5)
WBC: 15.4 10*3/uL — ABNORMAL HIGH (ref 4.0–10.5)

## 2014-03-28 LAB — POCT HEMOGLOBIN: Hemoglobin: 14.3 g/dL (ref 14.1–18.1)

## 2014-03-28 LAB — BASIC METABOLIC PANEL
BUN: 16 mg/dL (ref 6–23)
CO2: 26 mEq/L (ref 19–32)
CREATININE: 1.26 mg/dL (ref 0.50–1.35)
Calcium: 9.5 mg/dL (ref 8.4–10.5)
Chloride: 98 mEq/L (ref 96–112)
Glucose, Bld: 148 mg/dL — ABNORMAL HIGH (ref 70–99)
Potassium: 4.7 mEq/L (ref 3.5–5.3)
Sodium: 138 mEq/L (ref 135–145)

## 2014-03-28 MED ORDER — CEFPROZIL 500 MG PO TABS: 500.0000 mg | ORAL_TABLET | Freq: Two times a day (BID) | ORAL | Status: DC

## 2014-03-28 NOTE — Addendum Note (Signed)
Addended by: Dairl Ponder on: 03/28/2014 02:04 PM   Modules accepted: Orders

## 2014-03-28 NOTE — Progress Notes (Signed)
   Subjective:    Patient ID: Anthony Lucas, male    DOB: 01/20/1931, 80 y.o.   MRN: [id number]  HPI Patient with complaint of hematuria and dysuria- Urology cant see him till Jan 6    - Been peeing pure blood for 3 days was advised to go to ER for on call urologist but has not been. Patient states he has been able to urinate has had a couple times with small clots but never enough to cause blockage. He denies fever chills sweats He states his energy level is fair. He was concerned about the frequency of the blood in his urine. He is had this several times in the past but never quite this bad  Review of Systems He denies fever chills sweats abdominal pain vomiting diarrhea    Objective:   Physical Exam Lungs are clear Heart regular Abdomen soft no guarding rebound or tenderness No flank tenderness Prostate exam has BPH Urinalysis gross hematuria RBC TNTC  25 minutes spent with patient     Assessment & Plan:  Hematuria BPH Probable that the gross hematuria is related to significant BPH and associated issues. I did speak with urology Dr. Alinda Money of [facility]. Currently it is not felt patient needs scan or cystoscope. If hematuria persists into next week then they will get him in to see his urologist Dr. Jill Side Urine culture sent Antibiotics sent in Patient was told if he starts having large blood clots or inability to produce urine next step is to go to [place]

## 2014-03-31 LAB — URINE CULTURE

## 2014-04-10 ENCOUNTER — Ambulatory Visit (INDEPENDENT_AMBULATORY_CARE_PROVIDER_SITE_OTHER): Payer: Medicare Other | Admitting: Urology

## 2014-04-10 DIAGNOSIS — N390 Urinary tract infection, site not specified: Secondary | ICD-10-CM

## 2014-04-10 DIAGNOSIS — R310 Gross hematuria: Secondary | ICD-10-CM

## 2014-05-11 DIAGNOSIS — L851 Acquired keratosis [keratoderma] palmaris et plantaris: Secondary | ICD-10-CM | POA: Diagnosis not present

## 2014-05-11 DIAGNOSIS — E1142 Type 2 diabetes mellitus with diabetic polyneuropathy: Secondary | ICD-10-CM | POA: Diagnosis not present

## 2014-05-11 DIAGNOSIS — B351 Tinea unguium: Secondary | ICD-10-CM | POA: Diagnosis not present

## 2014-05-18 ENCOUNTER — Other Ambulatory Visit: Payer: Self-pay | Admitting: Family Medicine

## 2014-05-18 NOTE — Telephone Encounter (Signed)
Last seen 03/28/14

## 2014-06-12 ENCOUNTER — Ambulatory Visit (INDEPENDENT_AMBULATORY_CARE_PROVIDER_SITE_OTHER): Payer: Medicare Other | Admitting: Urology

## 2014-06-12 DIAGNOSIS — N390 Urinary tract infection, site not specified: Secondary | ICD-10-CM | POA: Diagnosis not present

## 2014-06-12 DIAGNOSIS — N400 Enlarged prostate without lower urinary tract symptoms: Secondary | ICD-10-CM | POA: Diagnosis not present

## 2014-07-17 ENCOUNTER — Ambulatory Visit (INDEPENDENT_AMBULATORY_CARE_PROVIDER_SITE_OTHER): Payer: Medicare Other | Admitting: Family Medicine

## 2014-07-17 ENCOUNTER — Encounter: Payer: Self-pay | Admitting: Family Medicine

## 2014-07-17 VITALS — BP 128/82 | Ht 71.0 in | Wt 229.2 lb

## 2014-07-17 DIAGNOSIS — E785 Hyperlipidemia, unspecified: Secondary | ICD-10-CM | POA: Diagnosis not present

## 2014-07-17 DIAGNOSIS — M109 Gout, unspecified: Secondary | ICD-10-CM | POA: Diagnosis not present

## 2014-07-17 DIAGNOSIS — M1000 Idiopathic gout, unspecified site: Secondary | ICD-10-CM

## 2014-07-17 DIAGNOSIS — E119 Type 2 diabetes mellitus without complications: Secondary | ICD-10-CM | POA: Diagnosis not present

## 2014-07-17 DIAGNOSIS — I10 Essential (primary) hypertension: Secondary | ICD-10-CM | POA: Diagnosis not present

## 2014-07-17 LAB — POCT GLYCOSYLATED HEMOGLOBIN (HGB A1C): HEMOGLOBIN A1C: 6.8

## 2014-07-17 MED ORDER — SIMVASTATIN 40 MG PO TABS: ORAL_TABLET | ORAL | Status: DC

## 2014-07-17 MED ORDER — HYDROCHLOROTHIAZIDE 25 MG PO TABS: ORAL_TABLET | ORAL | Status: DC

## 2014-07-17 MED ORDER — METOPROLOL SUCCINATE ER 50 MG PO TB24
ORAL_TABLET | ORAL | Status: DC
Start: 2014-07-17 — End: 2015-02-07

## 2014-07-17 MED ORDER — METFORMIN HCL 500 MG PO TABS: ORAL_TABLET | ORAL | Status: DC

## 2014-07-17 MED ORDER — POTASSIUM CHLORIDE ER 10 MEQ PO TBCR: 20.0000 meq | EXTENDED_RELEASE_TABLET | Freq: Every day | ORAL | Status: DC

## 2014-07-17 NOTE — Progress Notes (Signed)
   Subjective:    Patient ID: Anthony Lucas, male    DOB: 1930-08-14, 81 y.o.   MRN: [id number]  Diabetes He presents for his follow-up diabetic visit. He has type 2 diabetes mellitus. Risk factors for coronary artery disease include hypertension. Current diabetic treatment includes oral agent (monotherapy).   Patient denies any low sugar spells denies excessive sugars in the diet states he stays physically active. Does not do any exercise though. Rash on arms, this been going on for weeks to months does not cause any severe trouble Patient relates that he does take his gout medicine on a regular basis does not cause any troubles has not had any flareups lately He relates he does take his blood pressure medicine on a regular basis and his heart medicine not having any troubles there He does state that he is taking his metformin 1 tablet daily does not tolerate 2 per day Patient taking his cholesterol medicine and tolerating it fine  Review of Systems     Objective:   Physical Exam  Lungs are clear hearts regular pulse normal extremities no edema skin warm dry neurologic grossly normal abdomen soft      Assessment & Plan:  Rash on the arms nondistinctive he has a follow-up with dermatology in the future I advised him to talk with them about that.  Diabetes control overall pretty good watch diet continue medication stay as physically active as possible for his age  Blood pressure overall good control continue current measures avoid excessive salt  Hyperlipidemia continue medication watch diet  Gout no flareups recently avoid excessive proteins in the diet  Lab work was ordered await the results

## 2014-07-18 ENCOUNTER — Encounter: Payer: Self-pay | Admitting: Family Medicine

## 2014-07-18 LAB — HEPATIC FUNCTION PANEL
ALBUMIN: 4.1 g/dL (ref 3.5–4.7)
ALT: 25 IU/L (ref 0–44)
AST: 22 IU/L (ref 0–40)
Alkaline Phosphatase: 51 IU/L (ref 39–117)
BILIRUBIN TOTAL: 0.5 mg/dL (ref 0.0–1.2)
BILIRUBIN, DIRECT: 0.13 mg/dL (ref 0.00–0.40)
Total Protein: 7 g/dL (ref 6.0–8.5)

## 2014-07-18 LAB — LIPID PANEL
CHOL/HDL RATIO: 3.5 ratio (ref 0.0–5.0)
Cholesterol, Total: 177 mg/dL (ref 100–199)
HDL: 50 mg/dL (ref >39)
LDL CALC: 86 mg/dL (ref 0–99)
Triglycerides: 206 mg/dL — ABNORMAL HIGH (ref 0–149)
VLDL Cholesterol Cal: 41 mg/dL — ABNORMAL HIGH (ref 5–40)

## 2014-07-18 LAB — URIC ACID: Uric Acid: 6.9 mg/dL (ref 3.7–8.6)

## 2014-07-20 DIAGNOSIS — B351 Tinea unguium: Secondary | ICD-10-CM | POA: Diagnosis not present

## 2014-07-20 DIAGNOSIS — E1142 Type 2 diabetes mellitus with diabetic polyneuropathy: Secondary | ICD-10-CM | POA: Diagnosis not present

## 2014-07-20 DIAGNOSIS — L851 Acquired keratosis [keratoderma] palmaris et plantaris: Secondary | ICD-10-CM | POA: Diagnosis not present

## 2014-10-02 DIAGNOSIS — B351 Tinea unguium: Secondary | ICD-10-CM | POA: Diagnosis not present

## 2014-10-02 DIAGNOSIS — E1142 Type 2 diabetes mellitus with diabetic polyneuropathy: Secondary | ICD-10-CM | POA: Diagnosis not present

## 2014-10-02 DIAGNOSIS — L851 Acquired keratosis [keratoderma] palmaris et plantaris: Secondary | ICD-10-CM | POA: Diagnosis not present

## 2014-11-06 DIAGNOSIS — Z1283 Encounter for screening for malignant neoplasm of skin: Secondary | ICD-10-CM | POA: Diagnosis not present

## 2014-11-06 DIAGNOSIS — X32XXXD Exposure to sunlight, subsequent encounter: Secondary | ICD-10-CM | POA: Diagnosis not present

## 2014-11-06 DIAGNOSIS — L570 Actinic keratosis: Secondary | ICD-10-CM | POA: Diagnosis not present

## 2014-11-15 ENCOUNTER — Other Ambulatory Visit: Payer: Self-pay | Admitting: Family Medicine

## 2014-12-11 DIAGNOSIS — B351 Tinea unguium: Secondary | ICD-10-CM | POA: Diagnosis not present

## 2014-12-11 DIAGNOSIS — L851 Acquired keratosis [keratoderma] palmaris et plantaris: Secondary | ICD-10-CM | POA: Diagnosis not present

## 2014-12-11 DIAGNOSIS — E1142 Type 2 diabetes mellitus with diabetic polyneuropathy: Secondary | ICD-10-CM | POA: Diagnosis not present

## 2014-12-18 DIAGNOSIS — C44622 Squamous cell carcinoma of skin of right upper limb, including shoulder: Secondary | ICD-10-CM | POA: Diagnosis not present

## 2014-12-18 DIAGNOSIS — X32XXXD Exposure to sunlight, subsequent encounter: Secondary | ICD-10-CM | POA: Diagnosis not present

## 2014-12-18 DIAGNOSIS — L570 Actinic keratosis: Secondary | ICD-10-CM | POA: Diagnosis not present

## 2014-12-18 DIAGNOSIS — L820 Inflamed seborrheic keratosis: Secondary | ICD-10-CM | POA: Diagnosis not present

## 2015-01-04 DIAGNOSIS — Z23 Encounter for immunization: Secondary | ICD-10-CM | POA: Diagnosis not present

## 2015-01-15 ENCOUNTER — Encounter: Payer: Self-pay | Admitting: Family Medicine

## 2015-01-15 ENCOUNTER — Ambulatory Visit (INDEPENDENT_AMBULATORY_CARE_PROVIDER_SITE_OTHER): Payer: Medicare Other | Admitting: Family Medicine

## 2015-01-15 VITALS — BP 118/78 | Wt 219.0 lb

## 2015-01-15 DIAGNOSIS — N400 Enlarged prostate without lower urinary tract symptoms: Secondary | ICD-10-CM

## 2015-01-15 DIAGNOSIS — E785 Hyperlipidemia, unspecified: Secondary | ICD-10-CM | POA: Diagnosis not present

## 2015-01-15 DIAGNOSIS — I10 Essential (primary) hypertension: Secondary | ICD-10-CM | POA: Diagnosis not present

## 2015-01-15 DIAGNOSIS — E119 Type 2 diabetes mellitus without complications: Secondary | ICD-10-CM | POA: Diagnosis not present

## 2015-01-15 DIAGNOSIS — M1000 Idiopathic gout, unspecified site: Secondary | ICD-10-CM

## 2015-01-15 LAB — POCT GLYCOSYLATED HEMOGLOBIN (HGB A1C): Hemoglobin A1C: 7.1

## 2015-01-15 NOTE — Progress Notes (Signed)
   Subjective:    Patient ID: Anthony Lucas, male    DOB: [birth year], 81 y.o.   MRN: [id number]  Diabetes He presents for his follow-up diabetic visit. He has type 2 diabetes mellitus. Pertinent negatives for hypoglycemia include no confusion. Pertinent negatives for diabetes include no chest pain, no fatigue, no polydipsia, no polyphagia and no weakness. Current diabetic treatment includes oral agent (monotherapy). He is compliant with treatment all of the time.  pt has not check blood sugar lately.  Pt follows a diabetic diet.  Pt states no problems or concerns today.   patient denies any flareup of gout.  Patient states urination doing well with medication. Denies hematuria  patient states he is tolerating metformin once a day but states his diet hasn't been is good diabetic leak.  Patient denies any cardiac symptoms no chest pressure tightness or pain taking metoprolol in HCTZ without difficulty along with potassium  Patient tolerating cholesterol medicine on regular basis   Review of Systems  Constitutional: Negative for activity change, appetite change and fatigue.  HENT: Negative for congestion.   Respiratory: Negative for cough.   Cardiovascular: Negative for chest pain.  Gastrointestinal: Negative for abdominal pain.  Endocrine: Negative for polydipsia and polyphagia.  Neurological: Negative for weakness.  Psychiatric/Behavioral: Negative for confusion.    A1c 7.1 today    Objective:   Physical Exam  Constitutional: He appears well-nourished. No distress.  Cardiovascular: Normal rate, regular rhythm and normal heart sounds.   No murmur heard. Pulmonary/Chest: Effort normal and breath sounds normal. No respiratory distress.  Musculoskeletal: He exhibits no edema.  Lymphadenopathy:    He has no cervical adenopathy.  Neurological: He is alert.  Psychiatric: His behavior is normal.  Vitals reviewed.         Assessment & Plan:  1. Type 2 diabetes mellitus without  complication, unspecified long term insulin use status ([place]) Diabetes good control but he needs to work harder on diet medicine as is - POCT glycosylated hemoglobin (Hb A1C)  2. Essential hypertension Keep medicine as is, avoid excessive salt, walk as tolerated  3. Controlled type 2 diabetes mellitus without complication, without long-term current use of insulin ([facility]) Diabetic shoes recommended.  4. BPH (benign prostatic hyperplasia) Continue medication seems to be doing okay with this  5. Idiopathic gout, unspecified chronicity, unspecified site Not having any flareup continue allopurinol will need comprehensive lab work on next visit in 4-5 months  6. Hyperlipidemia Continue medication watch diet comprehensive lab work in 4-5 months with office visit

## 2015-02-07 ENCOUNTER — Other Ambulatory Visit: Payer: Self-pay | Admitting: Family Medicine

## 2015-02-11 ENCOUNTER — Other Ambulatory Visit: Payer: Self-pay | Admitting: Family Medicine

## 2015-02-20 ENCOUNTER — Encounter: Payer: Medicare Other | Admitting: Family Medicine

## 2015-02-20 ENCOUNTER — Encounter: Payer: Self-pay | Admitting: Family Medicine

## 2015-02-20 NOTE — Progress Notes (Deleted)
   Subjective:    Patient ID: Anthony Lucas, male    DOB: 21-Jan-1931, 81 y.o.   MRN: [id number]  HPI Patient is here today for a follow up from his last visit. Patient states that he received a letter stating he needs to come back in. Patient has felt bad for about 2 weeks. Patient is losing his voice and feels like his throat is closing up.     Review of Systems     Objective:   Physical Exam        Assessment & Plan:

## 2015-02-22 NOTE — Progress Notes (Signed)
This encounter was created in error - please disregard.

## 2015-02-26 DIAGNOSIS — E1142 Type 2 diabetes mellitus with diabetic polyneuropathy: Secondary | ICD-10-CM | POA: Diagnosis not present

## 2015-02-26 DIAGNOSIS — L851 Acquired keratosis [keratoderma] palmaris et plantaris: Secondary | ICD-10-CM | POA: Diagnosis not present

## 2015-02-26 DIAGNOSIS — B351 Tinea unguium: Secondary | ICD-10-CM | POA: Diagnosis not present

## 2015-03-21 ENCOUNTER — Encounter: Payer: Self-pay | Admitting: Gastroenterology

## 2015-04-09 DIAGNOSIS — L570 Actinic keratosis: Secondary | ICD-10-CM | POA: Diagnosis not present

## 2015-04-09 DIAGNOSIS — Z08 Encounter for follow-up examination after completed treatment for malignant neoplasm: Secondary | ICD-10-CM | POA: Diagnosis not present

## 2015-04-09 DIAGNOSIS — D225 Melanocytic nevi of trunk: Secondary | ICD-10-CM | POA: Diagnosis not present

## 2015-04-09 DIAGNOSIS — X32XXXD Exposure to sunlight, subsequent encounter: Secondary | ICD-10-CM | POA: Diagnosis not present

## 2015-04-09 DIAGNOSIS — B078 Other viral warts: Secondary | ICD-10-CM | POA: Diagnosis not present

## 2015-04-09 DIAGNOSIS — Z85828 Personal history of other malignant neoplasm of skin: Secondary | ICD-10-CM | POA: Diagnosis not present

## 2015-04-17 DIAGNOSIS — J449 Chronic obstructive pulmonary disease, unspecified: Secondary | ICD-10-CM | POA: Diagnosis not present

## 2015-04-17 DIAGNOSIS — I499 Cardiac arrhythmia, unspecified: Secondary | ICD-10-CM | POA: Diagnosis not present

## 2015-04-17 DIAGNOSIS — G4730 Sleep apnea, unspecified: Secondary | ICD-10-CM | POA: Diagnosis not present

## 2015-04-18 ENCOUNTER — Other Ambulatory Visit (HOSPITAL_COMMUNITY): Payer: Self-pay | Admitting: Respiratory Therapy

## 2015-04-18 DIAGNOSIS — J441 Chronic obstructive pulmonary disease with (acute) exacerbation: Secondary | ICD-10-CM

## 2015-04-22 ENCOUNTER — Other Ambulatory Visit (HOSPITAL_COMMUNITY): Payer: Self-pay | Admitting: Respiratory Therapy

## 2015-04-22 ENCOUNTER — Ambulatory Visit (HOSPITAL_COMMUNITY)
Admission: RE | Admit: 2015-04-22 | Discharge: 2015-04-22 | Disposition: A | Discharge status: Home / Self Care | Payer: Medicare Other | Source: Ambulatory Visit | Attending: Pulmonary Disease | Admitting: Pulmonary Disease

## 2015-04-22 DIAGNOSIS — I10 Essential (primary) hypertension: Secondary | ICD-10-CM

## 2015-04-22 DIAGNOSIS — I498 Other specified cardiac arrhythmias: Secondary | ICD-10-CM | POA: Insufficient documentation

## 2015-04-22 DIAGNOSIS — I499 Cardiac arrhythmia, unspecified: Secondary | ICD-10-CM

## 2015-04-22 DIAGNOSIS — G4730 Sleep apnea, unspecified: Secondary | ICD-10-CM

## 2015-04-25 ENCOUNTER — Ambulatory Visit (INDEPENDENT_AMBULATORY_CARE_PROVIDER_SITE_OTHER): Payer: Medicare Other | Admitting: Family Medicine

## 2015-04-25 ENCOUNTER — Encounter: Payer: Self-pay | Admitting: Family Medicine

## 2015-04-25 VITALS — BP 104/74 | Ht 71.0 in | Wt 221.0 lb

## 2015-04-25 DIAGNOSIS — E119 Type 2 diabetes mellitus without complications: Secondary | ICD-10-CM

## 2015-04-25 DIAGNOSIS — I10 Essential (primary) hypertension: Secondary | ICD-10-CM

## 2015-04-25 DIAGNOSIS — R0602 Shortness of breath: Secondary | ICD-10-CM

## 2015-04-25 DIAGNOSIS — I509 Heart failure, unspecified: Secondary | ICD-10-CM | POA: Insufficient documentation

## 2015-04-25 DIAGNOSIS — R5383 Other fatigue: Secondary | ICD-10-CM

## 2015-04-25 DIAGNOSIS — I519 Heart disease, unspecified: Secondary | ICD-10-CM | POA: Insufficient documentation

## 2015-04-25 MED ORDER — METOPROLOL SUCCINATE ER 25 MG PO TB24: 25.0000 mg | ORAL_TABLET | Freq: Every day | ORAL | Status: DC

## 2015-04-25 MED ORDER — LISINOPRIL 2.5 MG PO TABS: 2.5000 mg | ORAL_TABLET | Freq: Every day | ORAL | Status: DC

## 2015-04-25 NOTE — Progress Notes (Signed)
   Subjective:    Patient ID: Anthony Lucas, male    DOB: 12-Mar-1931, 82 y.o.   MRN: [id number]  HPI  Patient is in today to discuss Echo results. Patient states no other concerns this visit. Patient had noted intermittent coughing over the past 6 months and then noticed dyspnea on exertion. He went and saw a local pulmonologist who had him set up for a sleep study as well as pulmonary function testing and because of the dyspnea on exertion set him up for echo. Echo cane back abnormal and the patient was referred back to [place]. The patient does not have a history of any extensive heart disease. Patient does have a history of hypertension hyperlipidemia patient also has history of alcohol use. The echo showed significant calcification of the aortic valve no stenosis significant left ventricular dysfunction with evidence of congestive heart failure. Also some pulmonary artery hypertension as well. Review of Systems Patient does relate DOE the also relates at times PND denies chest pressure or tightness. Denies cough wheezing vomiting diarrhea or chills. States his weight stays stable. He does try to be somewhat healthy with his diet and activity   25 minutes was spent with the patient. Greater than half the time was spent in discussion and answering questions and counseling regarding the issues that the patient came in for today.  Objective:   Physical Exam Neck no masses lungs are clear no crackles heart with irregularity rate control there does appear to be a early systolic murmur although not severe abdomen soft extremities trace edema  EKG shows what appears to be atrial fibrillation also shows PVC and also left bundle branch block.     Assessment & Plan:  Atrial fibrillation rate is controlled. I do believe that this patient would benefit from anticoagulation. Onset of this is unknown area  HTN decent control currently  New onset CHF-we will be doing chest x-ray as well as blood work. In  addition to this I am reducing metoprolol from 50 mg XL new dose 25 mg extended release, I recommend that we get him set up with cardiology. We will start lisinopril 2.5 mg possibly be changing his diuretic but would like to look at his blood work firsts. I did encourage the patient to do a good job of watching his diet minimize excessive salt use. In addition to this he should be careful how much activity he does at one time. If he starts having significant PND he is be checked immediately. He should check his weight on a daily basis. If he has any confusion of his medications he is to follow-up. I explained to the patient that his ejection fraction is significantly low in this puts him at risk for acute failure. I will see the patient back in 6 weeks he will go ahead and see cardiology.

## 2015-04-26 ENCOUNTER — Ambulatory Visit (HOSPITAL_COMMUNITY)
Admission: RE | Admit: 2015-04-26 | Discharge: 2015-04-26 | Disposition: A | Discharge status: Home / Self Care | Payer: Medicare Other | Source: Ambulatory Visit | Attending: Family Medicine | Admitting: Family Medicine

## 2015-04-26 DIAGNOSIS — R0602 Shortness of breath: Secondary | ICD-10-CM | POA: Diagnosis not present

## 2015-04-26 DIAGNOSIS — R918 Other nonspecific abnormal finding of lung field: Secondary | ICD-10-CM | POA: Diagnosis not present

## 2015-04-26 DIAGNOSIS — R05 Cough: Secondary | ICD-10-CM | POA: Insufficient documentation

## 2015-04-26 DIAGNOSIS — R5383 Other fatigue: Secondary | ICD-10-CM | POA: Diagnosis not present

## 2015-04-26 DIAGNOSIS — I10 Essential (primary) hypertension: Secondary | ICD-10-CM | POA: Diagnosis not present

## 2015-04-26 DIAGNOSIS — E119 Type 2 diabetes mellitus without complications: Secondary | ICD-10-CM | POA: Diagnosis not present

## 2015-04-27 LAB — BASIC METABOLIC PANEL
BUN/Creatinine Ratio: 13 (ref 10–22)
BUN: 15 mg/dL (ref 8–27)
CALCIUM: 8.9 mg/dL (ref 8.6–10.2)
CO2: 26 mmol/L (ref 18–29)
CREATININE: 1.19 mg/dL (ref 0.76–1.27)
Chloride: 99 mmol/L (ref 96–106)
GFR calc Af Amer: 65 mL/min/{1.73_m2} (ref >59)
GFR, EST NON AFRICAN AMERICAN: 57 mL/min/{1.73_m2} — AB (ref >59)
Glucose: 123 mg/dL — ABNORMAL HIGH (ref 65–99)
Potassium: 4.4 mmol/L (ref 3.5–5.2)
SODIUM: 139 mmol/L (ref 134–144)

## 2015-04-27 LAB — BRAIN NATRIURETIC PEPTIDE: BNP: 339.7 pg/mL — ABNORMAL HIGH (ref 0.0–100.0)

## 2015-04-27 LAB — HEMOGLOBIN A1C
ESTIMATED AVERAGE GLUCOSE: 163 mg/dL
HEMOGLOBIN A1C: 7.3 % — AB (ref 4.8–5.6)

## 2015-04-27 LAB — TSH: TSH: 1.96 u[IU]/mL (ref 0.450–4.500)

## 2015-04-29 MED ORDER — FUROSEMIDE 20 MG PO TABS: ORAL_TABLET | ORAL | Status: DC

## 2015-04-29 NOTE — Addendum Note (Signed)
Addended by: Ofilia Neas R on: 04/29/2015 10:05 AM   Modules accepted: Orders, Medications

## 2015-05-02 ENCOUNTER — Encounter: Payer: Self-pay | Admitting: *Deleted

## 2015-05-02 ENCOUNTER — Encounter: Payer: Self-pay | Admitting: Cardiovascular Disease

## 2015-05-02 ENCOUNTER — Ambulatory Visit (INDEPENDENT_AMBULATORY_CARE_PROVIDER_SITE_OTHER): Payer: Medicare Other | Admitting: Cardiovascular Disease

## 2015-05-02 VITALS — BP 140/70 | HR 78 | Ht 73.0 in | Wt 222.0 lb

## 2015-05-02 DIAGNOSIS — I4891 Unspecified atrial fibrillation: Secondary | ICD-10-CM

## 2015-05-02 DIAGNOSIS — I429 Cardiomyopathy, unspecified: Secondary | ICD-10-CM

## 2015-05-02 DIAGNOSIS — I519 Heart disease, unspecified: Secondary | ICD-10-CM

## 2015-05-02 DIAGNOSIS — I5041 Acute combined systolic (congestive) and diastolic (congestive) heart failure: Secondary | ICD-10-CM

## 2015-05-02 DIAGNOSIS — I447 Left bundle-branch block, unspecified: Secondary | ICD-10-CM

## 2015-05-02 DIAGNOSIS — E871 Hypo-osmolality and hyponatremia: Secondary | ICD-10-CM

## 2015-05-02 DIAGNOSIS — I10 Essential (primary) hypertension: Secondary | ICD-10-CM | POA: Diagnosis not present

## 2015-05-02 DIAGNOSIS — E785 Hyperlipidemia, unspecified: Secondary | ICD-10-CM

## 2015-05-02 MED ORDER — SACUBITRIL-VALSARTAN 24-26 MG PO TABS: 1.0000 | ORAL_TABLET | Freq: Two times a day (BID) | ORAL | Status: DC

## 2015-05-02 NOTE — Addendum Note (Signed)
Addended by: Laurine Blazer on: 05/02/2015 10:34 AM   Modules accepted: Orders

## 2015-05-02 NOTE — Patient Instructions (Signed)
Your physician has recommended that you wear a 1 week event monitor. Event monitors are medical devices that record the heart's electrical activity. Doctors most often [place] these monitors to diagnose arrhythmias. Arrhythmias are problems with the speed or rhythm of the heartbeat. The monitor is a small, portable device. You can wear one while you do your normal daily activities. This is usually used to diagnose what is causing palpitations/syncope (passing out). Your physician has requested that you have a lexiscan myoview. For further information please visit [email or web address]. Please follow instruction sheet, as given. Lab for BMET - due in 1 week, around 05/09/2015 - order given today. Office will contact with results via phone or letter.   Stop Lisinopril. Begin Entresto 24mg /26mg  twice a day - samples given & new sent to [facility] today.  Must be off of the Lisinopril x 2 doses (36 hours) prior to beginning this medication.   Continue all other medications.   Follow up in  6 weeks.

## 2015-05-02 NOTE — Progress Notes (Signed)
Patient ID: Anthony Lucas, male   DOB: 25-Sep-1930, 82 y.o.   MRN: [id number]       CARDIOLOGY CONSULT NOTE  Patient ID: Anthony Lucas MRN: [id number] DOB/AGE: 1930/10/21 82 y.o.  Admit date: (Not on file) Primary Physician Sallee Lange, MD  Reason for Consultation: CHF, cardiomyopathy, atrial fibrillation  HPI: The patient is an 82 year old male who had been experiencing exertional dyspnea and was referred to a pulmonologist by his PCP. He underwent a sleep study and pulmonary function testing as well as an echocardiogram. He has a history of hypertension, hyperlipidemia, and alcohol use.   Echocardiogram on 04/22/15 demonstrated severely reduced left ventricular systolic function, EF 0000000, diffuse hypokinesis, diastolic dysfunction, mild LVH, aortic valve annular calcification, mitral annular calcification with mild mitral regurgitation, and mildly elevated pulmonary pressures 35 mmHg. It was noted that PASP was likely underestimated.  Chest x-ray on 1/20 showed no active cardiopulmonary disease.  BNP on 1/20: 339  Other 1/20 labs: Na 123, BUN 15, creat 1.2. Started on Lasix.  It was thought that he recently developed atrial fibrillation by his PCP.  ECG performed in the office today demonstrates sinus rhythm, left bundle branch block, with frequent PVCs, QRS duration 160 ms.. I believe this may have been seen on the ECG at his PCPs office as well on 1/19, with PAC's noted. However, there may be paroxysms of atrial fibrillation.  Since starting Lasix, he has felt better and has been able to sleep throughout the night. Prior to this he had been experiencing orthopnea and paroxysmal nocturnal dyspnea. He also had been having some mild leg swelling. He denies any history of chest pain and myocardial infarction.   Allergies  Allergen Reactions  . Bee Venom     Current Outpatient Prescriptions  Medication Sig Dispense Refill  . allopurinol (ZYLOPRIM) 100 MG tablet TAKE 2 TABLETS  BY MOUTH DAILY. 60 tablet 5  . finasteride (PROSCAR) 5 MG tablet TAKE ONE TABLET BY MOUTH ONCE DAILY. 30 tablet 5  . furosemide (LASIX) 20 MG tablet Take 1 tablet by mouth every morning. 30 tablet 5  . halobetasol (ULTRAVATE) 0.05 % cream Reported on 04/25/2015    . lisinopril (ZESTRIL) 2.5 MG tablet Take 1 tablet (2.5 mg total) by mouth daily. 90 tablet 1  . metFORMIN (GLUCOPHAGE) 500 MG tablet TAKE ONE TABLET BY MOUTH DAILY. 30 tablet 5  . metoprolol succinate (TOPROL-XL) 25 MG 24 hr tablet Take 1 tablet (25 mg total) by mouth daily. 90 tablet 1  . potassium chloride (K-DUR) 10 MEQ tablet TAKE 2 TABLETS BY MOUTH DAILY. 60 tablet 5  . simvastatin (ZOCOR) 40 MG tablet TAKE 1 TABLET BY MOUTH ONCE DAILY. 30 tablet 5   Current Facility-Administered Medications  Medication Dose Route Frequency Provider Last Rate Last Dose  . 0.9 %  sodium chloride infusion  500 mL Intravenous Continuous Sable Feil, MD        Past Medical History  Diagnosis Date  . Seasonal allergies   . Skin cancer   . Diabetes mellitus   . Hyperlipidemia   . Hypertension   . Sleep apnea     supposed to wear CPAP, but doesn't  . CHF (congestive heart failure) ([place])     Abnormal echo January 2017    Past Surgical History  Procedure Laterality Date  . Colonoscopy      4 procedures  . Polypectomy    . Cholecystectomy  1968  . Transurethral resection of prostate    .  Cataract extraction w/phaco Right 09/08/2012    Procedure: CATARACT EXTRACTION PHACO AND INTRAOCULAR LENS PLACEMENT (IOC);  Surgeon: Tonny Branch, MD;  Location: [facility];  Service: Ophthalmology;  Laterality: Right;  CDE: 21.28  . Cataract extraction w/phaco Left 12/08/2012    Procedure: CATARACT EXTRACTION PHACO AND INTRAOCULAR LENS PLACEMENT (IOC);  Surgeon: Tonny Branch, MD;  Location: [facility];  Service: Ophthalmology;  Laterality: Left;  CDE:25.72  . Prostate surgery      turp    Social History   Social History  . Marital Status: Married    Spouse  Name: N/A  . Number of Children: N/A  . Years of Education: N/A   Occupational History  . Not on file.   Social History Main Topics  . Smoking status: Never Smoker   . Smokeless tobacco: Never Used  . Alcohol Use: 4.2 oz/week    7 Standard drinks or equivalent per week     Comment: [place] 3-4oz a day  . Drug Use: No  . Sexual Activity: Yes    Birth Control/ Protection: None   Other Topics Concern  . Not on file   Social History Narrative     No family history of premature CAD in 1st degree relatives.  Prior to Admission medications   Medication Sig Start Date End Date Taking? Authorizing Provider  allopurinol (ZYLOPRIM) 100 MG tablet TAKE 2 TABLETS BY MOUTH DAILY. 02/11/15  Yes Kathyrn Drown, MD  finasteride (PROSCAR) 5 MG tablet TAKE ONE TABLET BY MOUTH ONCE DAILY. 11/15/14  Yes Kathyrn Drown, MD  furosemide (LASIX) 20 MG tablet Take 1 tablet by mouth every morning. 04/29/15  Yes Kathyrn Drown, MD  halobetasol (ULTRAVATE) 0.05 % cream Reported on 04/25/2015 03/07/13  Yes Historical Provider, MD  lisinopril (ZESTRIL) 2.5 MG tablet Take 1 tablet (2.5 mg total) by mouth daily. 04/25/15  Yes Kathyrn Drown, MD  metFORMIN (GLUCOPHAGE) 500 MG tablet TAKE ONE TABLET BY MOUTH DAILY. 02/07/15  Yes Kathyrn Drown, MD  metoprolol succinate (TOPROL-XL) 25 MG 24 hr tablet Take 1 tablet (25 mg total) by mouth daily. 04/25/15  Yes Kathyrn Drown, MD  potassium chloride (K-DUR) 10 MEQ tablet TAKE 2 TABLETS BY MOUTH DAILY. 02/07/15  Yes Kathyrn Drown, MD  simvastatin (ZOCOR) 40 MG tablet TAKE 1 TABLET BY MOUTH ONCE DAILY. 02/11/15  Yes Kathyrn Drown, MD     Review of systems complete and found to be negative unless listed above in HPI     Physical exam Blood pressure 140/70, pulse 78, height 6\' 1"  (1.854 m), weight 222 lb (100.699 kg), SpO2 96 %. General: NAD Neck: No JVD, no thyromegaly or thyroid nodule.  Lungs: Clear to auscultation bilaterally with normal respiratory effort. CV:  Nondisplaced PMI. Regular rate and irregular rhythm, normal S1/S2, no S3/S4, no murmur.  No peripheral edema.  No carotid bruit.    Abdomen: Soft, nontender, no distention.  Skin: Intact without lesions or rashes.  Neurologic: Alert and oriented x 3.  Psych: Normal affect. Extremities: No clubbing or cyanosis.  HEENT: Normal.   ECG: Most recent ECG reviewed.  Labs:   Lab Results  Component Value Date   WBC 15.4* 03/28/2014   HGB 14.6 03/28/2014   HCT 42.2 03/28/2014   MCV 80.5 03/28/2014   PLT 173 03/28/2014    Recent Labs Lab 04/26/15 0942  NA 139  K 4.4  CL 99  CO2 26  BUN 15  CREATININE 1.19  CALCIUM 8.9  GLUCOSE 123*   No results found for: CKTOTAL, CKMB, CKMBINDEX, TROPONINI Lab Results  Component Value Date   CHOL 177 07/17/2014   CHOL 150 01/23/2014   Lab Results  Component Value Date   HDL 50 07/17/2014   HDL 44 01/23/2014   Lab Results  Component Value Date   LDLCALC 86 07/17/2014   LDLCALC 82 01/23/2014   Lab Results  Component Value Date   TRIG 206* 07/17/2014   TRIG 121 01/23/2014   Lab Results  Component Value Date   CHOLHDL 3.5 07/17/2014   CHOLHDL 3.4 01/23/2014   No results found for: LDLDIRECT       Studies: No results found.  ASSESSMENT AND PLAN:  1. Cardiomyopathy, EF 20-25%/acute on chronic systolic heart failure: Symptomatically stable and euvolemic on Lasix 20 mg daily, with NYHA class II symptoms.  Will need to rule out ischemic etiology. Will obtain Lexiscan Cardiolite stress test. Recently started on Toprol-XL and lisinopril. On statin therapy. Will stop lisinopril for next 36 hours and start Entresto 24/26 mg bid.  On Lasix 20 mg daily. Will repeat BMET in one week. Na 123 on 1/20. Will repeat echocardiogram in several months. Has LBBB with QRS 160 ms. If LVEF remains severely reduced, would meet criteria for Biventricular ICD.  2. Atrial fibrillation: Today's ECG does not show this and 1/19 ECG shows sinus with PVC's  and PAC's, albeit atrial fibrillation cannot entirely be ruled out. Will obtain one week event monitor.  3. Essential HTN: Reasonably controlled for age. Monitor given initiation of Entresto.  4. Hyperlipidemia: Continue statin therapy.  5. LBBB: See #1. Possible BiV ICD candidate. Echo will need to be repeated in several months.  6. Hyponatremia: Will repeat BMET next week as this may be due to CHF.  Dispo: f/u 6 weeks.   Signed: Kate Sable, M.D., F.A.C.C.  05/02/2015, 9:26 AM

## 2015-05-03 ENCOUNTER — Ambulatory Visit (INDEPENDENT_AMBULATORY_CARE_PROVIDER_SITE_OTHER): Payer: Medicare Other

## 2015-05-03 DIAGNOSIS — I4891 Unspecified atrial fibrillation: Secondary | ICD-10-CM | POA: Diagnosis not present

## 2015-05-09 ENCOUNTER — Other Ambulatory Visit: Payer: Self-pay | Admitting: Family Medicine

## 2015-05-14 ENCOUNTER — Telehealth: Payer: Self-pay | Admitting: Cardiovascular Disease

## 2015-05-14 NOTE — Telephone Encounter (Signed)
Anthony Lucas  Has a question about taking his BP medication before his stress test tomorrown

## 2015-05-14 NOTE — Telephone Encounter (Signed)
Discussed with wife.  Only need to hold the Metformin the morning of test only.  She verbalized understanding.

## 2015-05-16 ENCOUNTER — Encounter (HOSPITAL_COMMUNITY)
Admission: RE | Admit: 2015-05-16 | Discharge: 2015-05-16 | Disposition: A | Discharge status: Home / Self Care | Payer: Medicare Other | Source: Ambulatory Visit | Attending: Cardiovascular Disease | Admitting: Cardiovascular Disease

## 2015-05-16 ENCOUNTER — Encounter (HOSPITAL_COMMUNITY): Payer: Self-pay

## 2015-05-16 ENCOUNTER — Inpatient Hospital Stay (HOSPITAL_COMMUNITY): Admission: RE | Admit: 2015-05-16 | Payer: BLUE CROSS/BLUE SHIELD | Source: Ambulatory Visit

## 2015-05-16 ENCOUNTER — Encounter (HOSPITAL_BASED_OUTPATIENT_CLINIC_OR_DEPARTMENT_OTHER)
Admission: RE | Admit: 2015-05-16 | Discharge: 2015-05-16 | Disposition: A | Discharge status: Home / Self Care | Payer: Medicare Other | Source: Ambulatory Visit | Attending: Cardiovascular Disease | Admitting: Cardiovascular Disease

## 2015-05-16 ENCOUNTER — Ambulatory Visit (HOSPITAL_COMMUNITY)
Admission: RE | Admit: 2015-05-16 | Discharge: 2015-05-16 | Disposition: A | Discharge status: Home / Self Care | Payer: Medicare Other | Source: Ambulatory Visit | Attending: Cardiovascular Disease | Admitting: Cardiovascular Disease

## 2015-05-16 DIAGNOSIS — I429 Cardiomyopathy, unspecified: Secondary | ICD-10-CM | POA: Insufficient documentation

## 2015-05-16 DIAGNOSIS — R9439 Abnormal result of other cardiovascular function study: Secondary | ICD-10-CM | POA: Diagnosis not present

## 2015-05-16 DIAGNOSIS — I447 Left bundle-branch block, unspecified: Secondary | ICD-10-CM | POA: Insufficient documentation

## 2015-05-16 LAB — NM MYOCAR MULTI W/SPECT W/WALL MOTION / EF
CHL CUP NUCLEAR SDS: 4
CHL CUP NUCLEAR SRS: 2
CHL CUP NUCLEAR SSS: 4
CSEPPHR: 115 {beats}/min
LHR: 0.31
LV dias vol: 125 mL
LVSYSVOL: 96 mL
Rest HR: 77 {beats}/min
TID: 0.98

## 2015-05-16 MED ORDER — TECHNETIUM TC 99M SESTAMIBI GENERIC - CARDIOLITE
10.0000 | Freq: Once | INTRAVENOUS | Status: AC | PRN
  Administered 2015-05-16: 10.8 via INTRAVENOUS

## 2015-05-16 MED ORDER — TECHNETIUM TC 99M SESTAMIBI - CARDIOLITE
30.0000 | Freq: Once | INTRAVENOUS | Status: AC | PRN
  Administered 2015-05-16: 32 via INTRAVENOUS

## 2015-05-16 MED ORDER — REGADENOSON 0.4 MG/5ML IV SOLN
INTRAVENOUS | Status: AC
  Administered 2015-05-16: 0.4 mg via INTRAVENOUS
  Filled 2015-05-16: qty 5

## 2015-05-16 MED ORDER — SODIUM CHLORIDE 0.9% FLUSH
INTRAVENOUS | Status: AC
  Administered 2015-05-16: 10 mL via INTRAVENOUS
  Filled 2015-05-16: qty 10

## 2015-05-17 ENCOUNTER — Telehealth: Payer: Self-pay | Admitting: *Deleted

## 2015-05-17 NOTE — Telephone Encounter (Signed)
Notes Recorded by Laurine Blazer, LPN on [id number] at [id number] PM Wife & patient notified. Copy to pmd. Follow up scheduled for 06/13/15 with Dr. Bronson Ing in [place].

## 2015-05-17 NOTE — Telephone Encounter (Signed)
Notes Recorded by Laurine Blazer, LPN on [id number] at [id number] AM Left message to return call with wife.

## 2015-05-17 NOTE — Telephone Encounter (Signed)
-----   Message from Herminio Commons, MD sent at 05/16/2015  2:28 PM EST ----- Possible prior heart attack leading to poor heart pumping function. No blockages requiring heart catheterization at this time.

## 2015-05-21 ENCOUNTER — Telehealth: Payer: Self-pay | Admitting: *Deleted

## 2015-05-21 DIAGNOSIS — L851 Acquired keratosis [keratoderma] palmaris et plantaris: Secondary | ICD-10-CM | POA: Diagnosis not present

## 2015-05-21 DIAGNOSIS — E1142 Type 2 diabetes mellitus with diabetic polyneuropathy: Secondary | ICD-10-CM | POA: Diagnosis not present

## 2015-05-21 DIAGNOSIS — B351 Tinea unguium: Secondary | ICD-10-CM | POA: Diagnosis not present

## 2015-05-21 NOTE — Telephone Encounter (Signed)
-----   Message from Herminio Commons, MD sent at 05/21/2015 11:58 AM EST ----- No atrial fibrillation.

## 2015-05-21 NOTE — Telephone Encounter (Signed)
Notes Recorded by Laurine Blazer, LPN on [id number] at [id number] PM Left message to return call.

## 2015-05-23 NOTE — Telephone Encounter (Signed)
Notes Recorded by Laurine Blazer, LPN on [id number] at [id number] PM Patient notified. Copy to pmd. Follow up scheduled for March with Dr. Bronson Ing.

## 2015-06-06 ENCOUNTER — Ambulatory Visit (INDEPENDENT_AMBULATORY_CARE_PROVIDER_SITE_OTHER): Payer: Medicare Other | Admitting: Family Medicine

## 2015-06-06 ENCOUNTER — Encounter: Payer: Self-pay | Admitting: Family Medicine

## 2015-06-06 ENCOUNTER — Other Ambulatory Visit: Payer: Self-pay

## 2015-06-06 VITALS — BP 128/84 | Ht 71.0 in | Wt 216.4 lb

## 2015-06-06 DIAGNOSIS — E1169 Type 2 diabetes mellitus with other specified complication: Secondary | ICD-10-CM | POA: Diagnosis not present

## 2015-06-06 DIAGNOSIS — I10 Essential (primary) hypertension: Secondary | ICD-10-CM | POA: Diagnosis not present

## 2015-06-06 DIAGNOSIS — E785 Hyperlipidemia, unspecified: Secondary | ICD-10-CM | POA: Diagnosis not present

## 2015-06-06 DIAGNOSIS — M1000 Idiopathic gout, unspecified site: Secondary | ICD-10-CM | POA: Diagnosis not present

## 2015-06-06 DIAGNOSIS — E871 Hypo-osmolality and hyponatremia: Secondary | ICD-10-CM | POA: Diagnosis not present

## 2015-06-06 MED ORDER — METOPROLOL SUCCINATE ER 25 MG PO TB24: ORAL_TABLET | ORAL | Status: DC

## 2015-06-06 NOTE — Addendum Note (Signed)
Addended by: Sallee Lange A on: 06/06/2015 08:26 PM   Modules accepted: Orders

## 2015-06-06 NOTE — Progress Notes (Signed)
   Subjective:    Patient ID: Anthony Lucas, male    DOB: 13-Mar-1931, 82 y.o.   MRN: [id number]  Diabetes He presents for his follow-up diabetic visit. He has type 2 diabetes mellitus. Risk factors for coronary artery disease include hypertension, diabetes mellitus and dyslipidemia. Current diabetic treatment includes oral agent (monotherapy). He is compliant with treatment all of the time. His weight is stable. He is following a diabetic diet.   Discuss recent labs  has CHF he relates he's breathing better but now he states when he stands up he feels like he has vertigo. He describes a sensation of almost passing out like he is getting very lightheaded 25 minutes was spent with the patient. Greater than half the time was spent in discussion and answering questions and counseling regarding the issues that the patient came in for today.  Review of Systems  denies headaches denies difficulty swallowing no chest tightness pressure pain states breathing better able to lay down 9 denies chest pressure denies abdominal discomfort no rectal bleeding no hematuria    Objective:   Physical Exam  neck no masses  no crackles heart regular pulse normal abdomen  Mild obesity extremities no edema diabetic foot exam completed   blood pressure was taken sitting and standing with significant drop with standing    Assessment & Plan:   A1c in January looked good. Continue current measures check lab work again toward May 2017  Patient has CHF cardiomyopathy may be related to the diabetes.   recent hyponatremia specialist recommended a follow-up met 7 he will go today to have this completed    symptoms of orthostatic hypotension- patient blood pressure does drop from sitting to standing. Reduce metoprolol from  25 mg to a half a tablet once daily   Congestive heart failure stable follow-up with cardiology   follow-up echo is recommended to the patient in if continued low ejection fraction consider ICD   hyperlipidemia continue current medication.    gout-under good control continue current medication   patient will need comprehensive lab work before his next visit. Patient will do comprehensive lab work in  June

## 2015-06-07 ENCOUNTER — Other Ambulatory Visit: Payer: Self-pay | Admitting: *Deleted

## 2015-06-07 ENCOUNTER — Encounter: Payer: Self-pay | Admitting: Family Medicine

## 2015-06-07 DIAGNOSIS — E785 Hyperlipidemia, unspecified: Secondary | ICD-10-CM

## 2015-06-07 DIAGNOSIS — E119 Type 2 diabetes mellitus without complications: Secondary | ICD-10-CM

## 2015-06-07 DIAGNOSIS — Z79899 Other long term (current) drug therapy: Secondary | ICD-10-CM

## 2015-06-07 LAB — BASIC METABOLIC PANEL
BUN/Creatinine Ratio: 12 (ref 10–22)
BUN: 14 mg/dL (ref 8–27)
CALCIUM: 9.1 mg/dL (ref 8.6–10.2)
CHLORIDE: 102 mmol/L (ref 96–106)
CO2: 23 mmol/L (ref 18–29)
CREATININE: 1.18 mg/dL (ref 0.76–1.27)
GFR, EST AFRICAN AMERICAN: 66 mL/min/{1.73_m2} (ref >59)
GFR, EST NON AFRICAN AMERICAN: 57 mL/min/{1.73_m2} — AB (ref >59)
Glucose: 112 mg/dL — ABNORMAL HIGH (ref 65–99)
Potassium: 4.5 mmol/L (ref 3.5–5.2)
Sodium: 142 mmol/L (ref 134–144)

## 2015-06-07 NOTE — Progress Notes (Signed)
Orders put in and mailed for pt to do in june

## 2015-06-13 ENCOUNTER — Ambulatory Visit: Payer: BLUE CROSS/BLUE SHIELD | Admitting: Cardiovascular Disease

## 2015-06-14 ENCOUNTER — Encounter: Payer: Self-pay | Admitting: Cardiovascular Disease

## 2015-06-14 ENCOUNTER — Ambulatory Visit (INDEPENDENT_AMBULATORY_CARE_PROVIDER_SITE_OTHER): Payer: Medicare Other | Admitting: Cardiovascular Disease

## 2015-06-14 VITALS — BP 142/74 | HR 103 | Ht 73.0 in | Wt 216.0 lb

## 2015-06-14 DIAGNOSIS — I447 Left bundle-branch block, unspecified: Secondary | ICD-10-CM

## 2015-06-14 DIAGNOSIS — I10 Essential (primary) hypertension: Secondary | ICD-10-CM | POA: Diagnosis not present

## 2015-06-14 DIAGNOSIS — I4891 Unspecified atrial fibrillation: Secondary | ICD-10-CM

## 2015-06-14 DIAGNOSIS — I429 Cardiomyopathy, unspecified: Secondary | ICD-10-CM

## 2015-06-14 DIAGNOSIS — I5022 Chronic systolic (congestive) heart failure: Secondary | ICD-10-CM

## 2015-06-14 DIAGNOSIS — I2510 Atherosclerotic heart disease of native coronary artery without angina pectoris: Secondary | ICD-10-CM

## 2015-06-14 DIAGNOSIS — E785 Hyperlipidemia, unspecified: Secondary | ICD-10-CM

## 2015-06-14 MED ORDER — METOPROLOL SUCCINATE ER 25 MG PO TB24: 25.0000 mg | ORAL_TABLET | Freq: Every day | ORAL | Status: DC

## 2015-06-14 NOTE — Progress Notes (Signed)
Patient ID: Anthony Lucas, male   DOB: Jun 15, 1930, 82 y.o.   MRN: [id number]      SUBJECTIVE: The patient returns for follow-up after undergoing cardiovascular testing for the evaluation of cardiomyopathy as well as atrial fibrillation. Event monitoring did not demonstrate any evidence of atrial fibrillation. Nuclear stress testing demonstrated a moderate sized area of myocardial scar in the inferior and inferoseptal walls suggestive of prior myocardial infarction. There were no ischemic territories.  No longer orthopneic. Denies any h/o chest pain. Denies leg swelling. Has stable exertional dyspnea.   Review of Systems: As per "subjective", otherwise negative.  Allergies  Allergen Reactions  . Bee Venom     Current Outpatient Prescriptions  Medication Sig Dispense Refill  . allopurinol (ZYLOPRIM) 100 MG tablet TAKE 2 TABLETS BY MOUTH DAILY. 60 tablet 5  . finasteride (PROSCAR) 5 MG tablet TAKE ONE TABLET BY MOUTH ONCE DAILY. 30 tablet 5  . furosemide (LASIX) 20 MG tablet Take 1 tablet by mouth every morning. 30 tablet 5  . halobetasol (ULTRAVATE) 0.05 % cream Reported on 04/25/2015    . metFORMIN (GLUCOPHAGE) 500 MG tablet TAKE ONE TABLET BY MOUTH DAILY. 30 tablet 5  . metoprolol succinate (TOPROL-XL) 25 MG 24 hr tablet 1/2 tablet daily 45 tablet 3  . potassium chloride (K-DUR) 10 MEQ tablet TAKE 2 TABLETS BY MOUTH DAILY. 60 tablet 5  . sacubitril-valsartan (ENTRESTO) 24-26 MG Take 1 tablet by mouth 2 (two) times daily. 60 tablet 6  . simvastatin (ZOCOR) 40 MG tablet TAKE 1 TABLET BY MOUTH ONCE DAILY. 30 tablet 5   Current Facility-Administered Medications  Medication Dose Route Frequency Provider Last Rate Last Dose  . 0.9 %  sodium chloride infusion  500 mL Intravenous Continuous Sable Feil, MD        Past Medical History  Diagnosis Date  . Seasonal allergies   . Skin cancer   . Diabetes mellitus   . Hyperlipidemia   . Hypertension   . Sleep apnea     supposed to  wear CPAP, but doesn't  . CHF (congestive heart failure) ([place])     Abnormal echo January 2017    Past Surgical History  Procedure Laterality Date  . Colonoscopy      4 procedures  . Polypectomy    . Cholecystectomy  1968  . Transurethral resection of prostate    . Cataract extraction w/phaco Right 09/08/2012    Procedure: CATARACT EXTRACTION PHACO AND INTRAOCULAR LENS PLACEMENT (IOC);  Surgeon: Tonny Branch, MD;  Location: [facility];  Service: Ophthalmology;  Laterality: Right;  CDE: 21.28  . Cataract extraction w/phaco Left 12/08/2012    Procedure: CATARACT EXTRACTION PHACO AND INTRAOCULAR LENS PLACEMENT (IOC);  Surgeon: Tonny Branch, MD;  Location: [facility];  Service: Ophthalmology;  Laterality: Left;  CDE:25.72  . Prostate surgery      turp    Social History   Social History  . Marital Status: Married    Spouse Name: N/A  . Number of Children: N/A  . Years of Education: N/A   Occupational History  . Not on file.   Social History Main Topics  . Smoking status: Never Smoker   . Smokeless tobacco: Never Used  . Alcohol Use: 4.2 oz/week    7 Standard drinks or equivalent per week     Comment: bourboun 3-4oz a day  . Drug Use: No  . Sexual Activity: Yes    Birth Control/ Protection: None   Other Topics Concern  . Not  on file   Social History Narrative     Filed Vitals:   06/14/15 1545  BP: 142/74  Pulse: 103  Height: 6\' 1"  (1.854 m)  Weight: 216 lb (97.977 kg)  SpO2: 96%    PHYSICAL EXAM General: NAD Neck: No JVD, no thyromegaly or thyroid nodule.  Lungs: Clear to auscultation bilaterally with normal respiratory effort. CV: Nondisplaced PMI. Tachycardic and irregular rhythm, normal S1/S2, no S3/S4, no murmur. No peripheral edema.   Abdomen: Soft, nontender, no distention.  Skin: Intact without lesions or rashes.  Neurologic: Alert and oriented x 3.  Psych: Normal affect. Extremities: No clubbing or cyanosis.  HEENT: Normal.   ECG: Most recent ECG  reviewed.      ASSESSMENT AND PLAN: 1. Cardiomyopathy, EF [id number] systolic heart failure: Symptomatically stable and euvolemic on Lasix 20 mg daily, with NYHA class II symptoms.  Lexiscan Cardiolite stress test reviewed above suggestive of prior MI. On Toprol-XL (will increase to 25 mg daily) and Entresto 24/26 mg bid. On statin therapy. Will add ASA 81 mg daily. On Lasix 20 mg daily. Will repeat echocardiogram in several months. Has LBBB with QRS 160 ms. If LVEF remains severely reduced, would meet criteria for Biventricular ICD.  2. Atrial fibrillation: ECG on 05/02/15 did not show this and 1/19 ECG shows sinus with PVC's and PAC's, albeit atrial fibrillation cannot entirely be ruled out. Event monitor did not demonstrate this. Will not initiate anticoagulation at this time.  3. Essential HTN: Reasonably controlled for age. Toprol-XL being increased to 25 mg daily.  4. Hyperlipidemia: Continue statin therapy.  5. LBBB: See #1. Possible BiV ICD candidate. Echo will need to be repeated in several months.  6. CAD: Appears to have had prior MI based on nuclear stress test. Will start ASA 81 mg daily and continue beta blocker, ARB, and statin.  Dispo: f/u 3 months.  Kate Sable, M.D., F.A.C.C.

## 2015-06-14 NOTE — Patient Instructions (Signed)
Your physician recommends that you schedule a follow-up appointment in: 3 months with Dr Bronson Ing   Start aspirin 81 mg daily   INCREASE Toprol XL to 25 mg daily   If you need a refill on your cardiac medications before your next appointment, please call your pharmacy.    Thank you for choosing [facility] !

## 2015-07-25 ENCOUNTER — Encounter: Payer: Self-pay | Admitting: Cardiovascular Disease

## 2015-07-30 DIAGNOSIS — B351 Tinea unguium: Secondary | ICD-10-CM | POA: Diagnosis not present

## 2015-07-30 DIAGNOSIS — E1142 Type 2 diabetes mellitus with diabetic polyneuropathy: Secondary | ICD-10-CM | POA: Diagnosis not present

## 2015-07-30 DIAGNOSIS — L851 Acquired keratosis [keratoderma] palmaris et plantaris: Secondary | ICD-10-CM | POA: Diagnosis not present

## 2015-08-06 ENCOUNTER — Other Ambulatory Visit: Payer: Self-pay | Admitting: Family Medicine

## 2015-08-29 ENCOUNTER — Encounter: Payer: Self-pay | Admitting: Family Medicine

## 2015-08-29 ENCOUNTER — Ambulatory Visit (INDEPENDENT_AMBULATORY_CARE_PROVIDER_SITE_OTHER): Payer: Medicare Other | Admitting: Family Medicine

## 2015-08-29 VITALS — BP 122/78 | Temp 98.5°F | Ht 71.0 in | Wt 222.1 lb

## 2015-08-29 DIAGNOSIS — K921 Melena: Secondary | ICD-10-CM

## 2015-08-29 DIAGNOSIS — I2510 Atherosclerotic heart disease of native coronary artery without angina pectoris: Secondary | ICD-10-CM | POA: Diagnosis not present

## 2015-08-29 LAB — POCT HEMOGLOBIN: HEMOGLOBIN: 13.2 g/dL — AB (ref 14.1–18.1)

## 2015-08-29 NOTE — Progress Notes (Signed)
   Subjective:    Patient ID: Anthony Lucas, male    DOB: 1930-06-25, 82 y.o.   MRN: [id number]  HPI  Patient in today for blood in stools. Onset two days ago. States blood is bright red. About a tablespoon amount in stools.  Patient presents with history of recent blood in stools. Has had some hard stools lately. No abdominal pain. No history of hemorrhoids. Next  Review of chart reveals patient had diverticulosis on last scope 5 years ago.  Patient's noted approximate tablespoon of blood bright red in nature per rectum past 2 days  No lightheadedness  Results for orders placed or performed in visit on 08/29/15  POCT hemoglobin  Result Value Ref Range   Hemoglobin 13.2 (A) 14.1 - 18.1 g/dL   Had a hard stool a couple day go, took a stool softener  Review of Systems No headache, no major weight loss or weight gain, no chest pain no back pain abdominal pain no change in bowel habits complete ROS otherwise negative     Objective:   Physical Exam Alert vitals stable blood pressure excellent no orthostatic changes no acute distress lungs clear heart regular rhythm abdomen complete benign rectal exam stool brownish but heme positive no masses       Assessment & Plan:  Impression hematochezia. Hemoglobin excellent hemoglobin dynamically stable warning signs discussed carefully. Plan GI referral. Probable diverticulosis discussed with patient use stool softener WSL

## 2015-08-30 ENCOUNTER — Encounter: Payer: Self-pay | Admitting: Family Medicine

## 2015-08-30 ENCOUNTER — Telehealth: Payer: Self-pay | Admitting: Family Medicine

## 2015-08-30 NOTE — Telephone Encounter (Signed)
Pt has seen [facility] in 2012 - also no referral in my "Q"   Please advise (sometimes can be hard to get new GI to take a case once pt is established with a GI)

## 2015-08-30 NOTE — Telephone Encounter (Signed)
Anthony Lucas this is basically because all of the GI doctors are basically cherry picking patients who only need scopes. This patient will need a scope. Therefore I am sure they will be happy to see him. He would like to bring his GI care closer to home

## 2015-09-11 ENCOUNTER — Other Ambulatory Visit: Payer: Self-pay | Admitting: Gastroenterology

## 2015-09-11 ENCOUNTER — Ambulatory Visit (INDEPENDENT_AMBULATORY_CARE_PROVIDER_SITE_OTHER): Payer: Medicare Other | Admitting: Gastroenterology

## 2015-09-11 ENCOUNTER — Encounter: Payer: Self-pay | Admitting: Gastroenterology

## 2015-09-11 VITALS — BP 138/70 | HR 61 | Temp 97.9°F | Ht 73.0 in | Wt 221.1 lb

## 2015-09-11 DIAGNOSIS — I2510 Atherosclerotic heart disease of native coronary artery without angina pectoris: Secondary | ICD-10-CM

## 2015-09-11 DIAGNOSIS — K625 Hemorrhage of anus and rectum: Secondary | ICD-10-CM | POA: Diagnosis not present

## 2015-09-11 MED ORDER — HYDROCORTISONE 2.5 % RE CREA: 1.0000 "application " | TOPICAL_CREAM | Freq: Two times a day (BID) | RECTAL | Status: DC

## 2015-09-11 NOTE — Assessment & Plan Note (Addendum)
82 year old pleasant male with low-volume hematochezia X 2 in the setting of straining and hard stool. Last colonoscopy in 2012 by Dr. Sharlett Iles without polyps, but he does have a history of adenomas in remote past. Discussed this is likely from a benign anorectal source, but I am unable to definitively say this without lower GI tract evaluation. Although he is 23, he is in overall good health, and I don't think a colonoscopy should be completely excluded at this time. We discussed risks and benefits, but he would like to take a more conservative approach at this time. In the interim, will provide Anusol cream, check CBC, and monitor for any further overt GI bleeding. Discussed adding fiber to diet and avoidance of straining.

## 2015-09-11 NOTE — Progress Notes (Signed)
Primary Care Physician:  Sallee Lange, MD Primary Gastroenterologist:  Dr. Oneida Alar   Chief Complaint  Patient presents with  . Blood In Stools    HPI:   Anthony Lucas is a 82 y.o. male presenting today at the request of Dr. Wolfgang Phoenix secondary to rectal bleeding. Most recent Hgb 13.2. Last colonoscopy in 2012 by Dr. Sharlett Iles without polyps, but he does have a remote history of adenomas.   Had a hard stool and then passed some blood. Skipped another day, had a BM, then had blood again. None since this time. Hasn't seen any further rectal bleeding. Denies abdominal pain. No N/V. No upper GI symptoms. Occasional hard stool. Once in awhile will take a stool softener. Rectal exam done by Dr. Wolfgang Phoenix and benign on 08/29/15. No itching/burning. 81 mg aspirin daily.   Past Medical History  Diagnosis Date  . Seasonal allergies   . Skin cancer   . Diabetes mellitus   . Hyperlipidemia   . Hypertension   . Sleep apnea     supposed to wear CPAP, but doesn't  . CHF (congestive heart failure) ([place])     Abnormal echo January 2017  . History of colon polyps     Past Surgical History  Procedure Laterality Date  . Colonoscopy      4 procedures  . Polypectomy    . Cholecystectomy  1968  . Transurethral resection of prostate    . Cataract extraction w/phaco Right 09/08/2012    Procedure: CATARACT EXTRACTION PHACO AND INTRAOCULAR LENS PLACEMENT (IOC);  Surgeon: Tonny Branch, MD;  Location: [facility];  Service: Ophthalmology;  Laterality: Right;  CDE: 21.28  . Cataract extraction w/phaco Left 12/08/2012    Procedure: CATARACT EXTRACTION PHACO AND INTRAOCULAR LENS PLACEMENT (IOC);  Surgeon: Tonny Branch, MD;  Location: [facility];  Service: Ophthalmology;  Laterality: Left;  CDE:25.72  . Prostate surgery      turp  . Colonoscopy  2012    Dr. Sharlett Iles: diverticulosis, lipoma in ascending colon.     Current Outpatient Prescriptions  Medication Sig Dispense Refill  . allopurinol (ZYLOPRIM) 100 MG tablet TAKE 2  TABLETS BY MOUTH DAILY. 60 tablet 5  . aspirin EC 81 MG tablet Take 81 mg by mouth daily. Reported on 08/29/2015    . finasteride (PROSCAR) 5 MG tablet TAKE ONE TABLET BY MOUTH ONCE DAILY. 30 tablet 5  . furosemide (LASIX) 20 MG tablet Take 1 tablet by mouth every morning. 30 tablet 5  . halobetasol (ULTRAVATE) 0.05 % cream Reported on 04/25/2015    . metFORMIN (GLUCOPHAGE) 500 MG tablet TAKE ONE TABLET BY MOUTH DAILY. 30 tablet 5  . metoprolol succinate (TOPROL XL) 25 MG 24 hr tablet Take 1 tablet (25 mg total) by mouth daily. 90 tablet 3  . potassium chloride (K-DUR) 10 MEQ tablet TAKE 2 TABLETS BY MOUTH DAILY. 60 tablet 5  . sacubitril-valsartan (ENTRESTO) 24-26 MG Take 1 tablet by mouth 2 (two) times daily. 60 tablet 6  . simvastatin (ZOCOR) 40 MG tablet TAKE 1 TABLET BY MOUTH ONCE DAILY. 30 tablet 5   Current Facility-Administered Medications  Medication Dose Route Frequency Provider Last Rate Last Dose  . 0.9 %  sodium chloride infusion  500 mL Intravenous Continuous Sable Feil, MD        Allergies as of 09/11/2015 - Review Complete 09/11/2015  Allergen Reaction Noted  . Bee venom  01/18/2014    Family History  Problem Relation Age of Onset  . Lung  cancer Father   . Colon cancer Neg Hx     Social History   Social History  . Marital Status: Married    Spouse Name: N/A  . Number of Children: N/A  . Years of Education: N/A   Occupational History  . Not on file.   Social History Main Topics  . Smoking status: Never Smoker   . Smokeless tobacco: Never Used  . Alcohol Use: 4.2 oz/week    7 Standard drinks or equivalent per week     Comment: bourboun 3-4oz a day  . Drug Use: No  . Sexual Activity: Yes    Birth Control/ Protection: None   Other Topics Concern  . Not on file   Social History Narrative    Review of Systems: Gen: Denies any fever, chills, fatigue, weight loss, lack of appetite.  CV: Denies chest pain, heart palpitations, peripheral edema,  syncope.  Resp: Denies shortness of breath at rest or with exertion. Denies wheezing or cough.  GI: see HPI  GU : Denies urinary burning, urinary frequency, urinary hesitancy MS: Denies joint pain, muscle weakness, cramps, or limitation of movement.  Derm: Denies rash, itching, dry skin Psych: Denies depression, anxiety, memory loss, and confusion Heme: see HPI.  Physical Exam: BP 138/70 mmHg  Pulse 61  Temp(Src) 97.9 F (36.6 C)  Ht 6\' 1"  (1.854 m)  Wt 221 lb 2.2 oz (100.308 kg)  BMI 29.18 kg/m2 General:   Alert and oriented. Pleasant and cooperative. Well-nourished and well-developed.  Head:  Normocephalic and atraumatic. Eyes:  Without icterus, sclera clear and conjunctiva pink.  Ears:  Normal auditory acuity. Nose:  No deformity, discharge,  or lesions. Mouth:  No deformity or lesions, oral mucosa pink.  Lungs:  Clear to auscultation bilaterally. No wheezes, rales, or rhonchi. No distress.  Heart:  S1, S2 present without murmurs appreciated.  Abdomen:  +BS, soft, non-tender and non-distended. No HSM noted. No guarding or rebound. No masses appreciated.  Rectal:  Deferred  Msk:  Symmetrical without gross deformities. Normal posture. Extremities:  Without edema. Neurologic:  Alert and  oriented x4;  grossly normal neurologically. Psych:  Alert and cooperative. Normal mood and affect.

## 2015-09-11 NOTE — Patient Instructions (Signed)
Please have blood work done today.   I have sent in a cream to use per rectum twice a day for 7 days.   We will hold off on a colonoscopy unless something changes or your blood work is abnormal.   Further recommendations to follow!

## 2015-09-11 NOTE — Progress Notes (Signed)
cc'ed to pcp °

## 2015-09-12 ENCOUNTER — Ambulatory Visit (INDEPENDENT_AMBULATORY_CARE_PROVIDER_SITE_OTHER): Payer: Medicare Other | Admitting: Cardiovascular Disease

## 2015-09-12 ENCOUNTER — Encounter: Payer: Self-pay | Admitting: Cardiovascular Disease

## 2015-09-12 VITALS — BP 122/64 | HR 90 | Ht 73.0 in | Wt 221.0 lb

## 2015-09-12 DIAGNOSIS — E785 Hyperlipidemia, unspecified: Secondary | ICD-10-CM

## 2015-09-12 DIAGNOSIS — I2510 Atherosclerotic heart disease of native coronary artery without angina pectoris: Secondary | ICD-10-CM

## 2015-09-12 DIAGNOSIS — I447 Left bundle-branch block, unspecified: Secondary | ICD-10-CM

## 2015-09-12 DIAGNOSIS — I4891 Unspecified atrial fibrillation: Secondary | ICD-10-CM | POA: Diagnosis not present

## 2015-09-12 DIAGNOSIS — I5022 Chronic systolic (congestive) heart failure: Secondary | ICD-10-CM

## 2015-09-12 DIAGNOSIS — I429 Cardiomyopathy, unspecified: Secondary | ICD-10-CM | POA: Diagnosis not present

## 2015-09-12 DIAGNOSIS — I519 Heart disease, unspecified: Secondary | ICD-10-CM

## 2015-09-12 DIAGNOSIS — I10 Essential (primary) hypertension: Secondary | ICD-10-CM

## 2015-09-12 LAB — CBC
HEMATOCRIT: 41.1 % (ref 37.5–51.0)
Hemoglobin: 13.5 g/dL (ref 12.6–17.7)
MCH: 27.8 pg (ref 26.6–33.0)
MCHC: 32.8 g/dL (ref 31.5–35.7)
MCV: 85 fL (ref 79–97)
PLATELETS: 166 10*3/uL (ref 150–379)
RBC: 4.86 x10E6/uL (ref 4.14–5.80)
RDW: 14.7 % (ref 12.3–15.4)
WBC: 6.3 10*3/uL (ref 3.4–10.8)

## 2015-09-12 NOTE — Patient Instructions (Addendum)
Your physician wants you to follow-up in: 6 months You will receive a reminder letter in the mail two months in advance. If you don't receive a letter, please call our office to schedule the follow-up appointment.      Your physician recommends that you continue on your current medications as directed. Please refer to the Current Medication list given to you today.     Your physician has requested that you have an echocardiogram. Echocardiography is a painless test that uses sound waves to create images of your heart. It provides your doctor with information about the size and shape of your heart and how well your heart's chambers and valves are working. This procedure takes approximately one hour. There are no restrictions for this procedure.      Thank you for choosing  [facility] !    

## 2015-09-12 NOTE — Progress Notes (Signed)
Patient ID: RODNEY SHAMBURG, male   DOB: 07-03-1930, 82 y.o.   MRN: [id number]      SUBJECTIVE: The patient presents for follow up of CAD, severe LV dysfunction, and atrial fibrillation.  The patient denies any symptoms of chest pain, palpitations, shortness of breath, leg swelling, orthopnea, PND, and syncope.  Worked in his garden this morning.  Review of Systems: As per "subjective", otherwise negative.  Allergies  Allergen Reactions  . Bee Venom     Current Outpatient Prescriptions  Medication Sig Dispense Refill  . allopurinol (ZYLOPRIM) 100 MG tablet TAKE 2 TABLETS BY MOUTH DAILY. 60 tablet 5  . aspirin EC 81 MG tablet Take 81 mg by mouth daily. Reported on 08/29/2015    . finasteride (PROSCAR) 5 MG tablet TAKE ONE TABLET BY MOUTH ONCE DAILY. 30 tablet 5  . furosemide (LASIX) 20 MG tablet Take 1 tablet by mouth every morning. 30 tablet 5  . halobetasol (ULTRAVATE) 0.05 % cream Reported on 04/25/2015    . metFORMIN (GLUCOPHAGE) 500 MG tablet TAKE ONE TABLET BY MOUTH DAILY. 30 tablet 5  . metoprolol succinate (TOPROL XL) 25 MG 24 hr tablet Take 1 tablet (25 mg total) by mouth daily. 90 tablet 3  . potassium chloride (K-DUR) 10 MEQ tablet TAKE 2 TABLETS BY MOUTH DAILY. 60 tablet 5  . sacubitril-valsartan (ENTRESTO) 24-26 MG Take 1 tablet by mouth 2 (two) times daily. 60 tablet 6  . simvastatin (ZOCOR) 40 MG tablet TAKE 1 TABLET BY MOUTH ONCE DAILY. 30 tablet 5   Current Facility-Administered Medications  Medication Dose Route Frequency Provider Last Rate Last Dose  . 0.9 %  sodium chloride infusion  500 mL Intravenous Continuous Sable Feil, MD        Past Medical History  Diagnosis Date  . Seasonal allergies   . Skin cancer   . Diabetes mellitus   . Hyperlipidemia   . Hypertension   . Sleep apnea     supposed to wear CPAP, but doesn't  . CHF (congestive heart failure) ([place])     Abnormal echo January 2017  . History of colon polyps     Past Surgical History    Procedure Laterality Date  . Colonoscopy      4 procedures  . Polypectomy    . Cholecystectomy  1968  . Transurethral resection of prostate    . Cataract extraction w/phaco Right 09/08/2012    Procedure: CATARACT EXTRACTION PHACO AND INTRAOCULAR LENS PLACEMENT (IOC);  Surgeon: Tonny Branch, MD;  Location: [facility];  Service: Ophthalmology;  Laterality: Right;  CDE: 21.28  . Cataract extraction w/phaco Left 12/08/2012    Procedure: CATARACT EXTRACTION PHACO AND INTRAOCULAR LENS PLACEMENT (IOC);  Surgeon: Tonny Branch, MD;  Location: [facility];  Service: Ophthalmology;  Laterality: Left;  CDE:25.72  . Prostate surgery      turp  . Colonoscopy  2012    Dr. Sharlett Iles: diverticulosis, lipoma in ascending colon.   . Total knee arthroplasty      Social History   Social History  . Marital Status: Married    Spouse Name: N/A  . Number of Children: N/A  . Years of Education: N/A   Occupational History  . Not on file.   Social History Main Topics  . Smoking status: Never Smoker   . Smokeless tobacco: Never Used  . Alcohol Use: 4.2 oz/week    7 Standard drinks or equivalent per week     Comment: bourboun 3-4oz a day  .  Drug Use: No  . Sexual Activity: Yes    Birth Control/ Protection: None   Other Topics Concern  . Not on file   Social History Narrative     Filed Vitals:   09/12/15 1342  BP: 122/64  Pulse: 90  Height: 6\' 1"  (1.854 m)  Weight: 221 lb (100.245 kg)  SpO2: 99%    PHYSICAL EXAM General: NAD HEENT: Normal. Neck: No JVD, no thyromegaly. Lungs: Clear to auscultation bilaterally with normal respiratory effort. CV: Nondisplaced PMI.  Regular rate and rhythm, normal S1/S2, no S3/S4, no murmur. No pretibial or periankle edema.   Abdomen: Soft, nontender, no distention.  Neurologic: Alert and oriented.  Psych: Normal affect. Skin: Normal. Musculoskeletal: No gross deformities.    ECG: Most recent ECG reviewed.      ASSESSMENT AND PLAN: 1. Cardiomyopathy, EF  [id number] systolic heart failure: Symptomatically stable and euvolemic on Lasix 20 mg daily, with NYHA class II symptoms.  Lexiscan Cardiolite stress test suggestive of prior MI. On Toprol-XL and Entresto 24/26 mg bid. On statin therapy and ASA 81 mg daily along with Lasix 20 mg daily. Will repeat echocardiogram. Has LBBB with QRS 160 ms. If LVEF remains severely reduced, would meet criteria for biventricular ICD.  2. Atrial fibrillation: ECG on 05/02/15 did not show this and 1/19 ECG shows sinus with PVC's and PAC's, albeit atrial fibrillation cannot entirely be ruled out. Event monitor did not demonstrate this. Will not initiate anticoagulation at this time.  3. Essential HTN: Controlled. No changes.  4. Hyperlipidemia: Continue statin therapy.  5. LBBB: See #1. Possible BiV ICD candidate. Echo will need to be repeated.  6. CAD: Appears to have had prior MI based on nuclear stress test.  Continue ASA, beta blocker, ARB, and statin.  Dispo: f/u 6 months.   Kate Sable, M.D., F.A.C.C.

## 2015-09-16 ENCOUNTER — Telehealth: Payer: Self-pay

## 2015-09-16 NOTE — Telephone Encounter (Signed)
Labs from [place] placed on [employer]

## 2015-09-17 ENCOUNTER — Ambulatory Visit (HOSPITAL_COMMUNITY)
Admission: RE | Admit: 2015-09-17 | Discharge: 2015-09-17 | Disposition: A | Discharge status: Home / Self Care | Payer: Medicare Other | Source: Ambulatory Visit | Attending: Cardiovascular Disease | Admitting: Cardiovascular Disease

## 2015-09-17 DIAGNOSIS — I429 Cardiomyopathy, unspecified: Secondary | ICD-10-CM | POA: Insufficient documentation

## 2015-09-17 DIAGNOSIS — E785 Hyperlipidemia, unspecified: Secondary | ICD-10-CM | POA: Diagnosis not present

## 2015-09-17 DIAGNOSIS — Z8546 Personal history of malignant neoplasm of prostate: Secondary | ICD-10-CM | POA: Insufficient documentation

## 2015-09-17 DIAGNOSIS — I340 Nonrheumatic mitral (valve) insufficiency: Secondary | ICD-10-CM | POA: Insufficient documentation

## 2015-09-17 DIAGNOSIS — I071 Rheumatic tricuspid insufficiency: Secondary | ICD-10-CM | POA: Diagnosis not present

## 2015-09-17 DIAGNOSIS — I110 Hypertensive heart disease with heart failure: Secondary | ICD-10-CM | POA: Insufficient documentation

## 2015-09-17 DIAGNOSIS — I509 Heart failure, unspecified: Secondary | ICD-10-CM | POA: Diagnosis not present

## 2015-09-17 DIAGNOSIS — E119 Type 2 diabetes mellitus without complications: Secondary | ICD-10-CM | POA: Diagnosis not present

## 2015-09-17 LAB — ECHOCARDIOGRAM COMPLETE
CHL CUP MV DEC (S): 183
CHL CUP TV REG PEAK VELOCITY: 243 cm/s
E decel time: 183 msec
FS: 16 % — AB (ref 28–44)
IVS/LV PW RATIO, ED: 0.95
LA diam index: 1.92 cm/m2
LA vol index: 37.5 mL/m2
LASIZE: 43 mm
LAVOL: 84 mL
LAVOLA4C: 72.6 mL
LDCA: 3.46 cm2
LEFT ATRIUM END SYS DIAM: 43 mm
LV SIMPSON'S DISK: 35
LV dias vol index: 45 mL/m2
LV sys vol index: 29 mL/m2
LV sys vol: 65 mL — AB (ref 21–61)
LVDIAVOL: 100 mL (ref 62–150)
LVOT diameter: 21 mm
MV pk E vel: 89.1 m/s
MVPG: 3 mmHg
MVPKAVEL: 72.3 m/s
PV Reg vel dias: 110 cm/s
PW: 11.1 mm — AB (ref 0.6–1.1)
RV TAPSE: 19.4 mm
Stroke v: 35 ml
TRMAXVEL: 243 cm/s

## 2015-09-18 ENCOUNTER — Encounter: Payer: Self-pay | Admitting: Gastroenterology

## 2015-09-18 NOTE — Progress Notes (Signed)
Quick Note:  Pt is aware. Ok to schedule follow up. ______

## 2015-09-18 NOTE — Progress Notes (Signed)
Quick Note:  Hgb normal. Let's have him return in 4-6 weeks to see how he is doing. ______

## 2015-09-19 ENCOUNTER — Ambulatory Visit: Payer: BLUE CROSS/BLUE SHIELD | Admitting: Cardiovascular Disease

## 2015-09-20 ENCOUNTER — Other Ambulatory Visit (HOSPITAL_COMMUNITY): Payer: Medicare Other

## 2015-09-23 ENCOUNTER — Telehealth: Payer: Self-pay

## 2015-09-23 DIAGNOSIS — I519 Heart disease, unspecified: Secondary | ICD-10-CM

## 2015-09-23 NOTE — Telephone Encounter (Signed)
-----   Message from Herminio Commons, MD sent at 09/23/2015  9:45 AM EDT ----- Please make EP referral for BiV ICD consideration. Thank you.  Dr. Bronson Ing  ----- Message -----    From: Arnoldo Lenis, MD    Sent: 09/18/2015  11:19 AM      To: Herminio Commons, MD  I had nurses contact patient with results of echo. Your note mentioned possible consideration for BiV ICD, I will defer the decision to place that referral for when you get back.   Zandra Abts MD

## 2015-09-23 NOTE — Telephone Encounter (Signed)
I placed referral to DrTaylor, and spoke with wife and informed her.

## 2015-10-01 DIAGNOSIS — E119 Type 2 diabetes mellitus without complications: Secondary | ICD-10-CM | POA: Diagnosis not present

## 2015-10-01 DIAGNOSIS — Z79899 Other long term (current) drug therapy: Secondary | ICD-10-CM | POA: Diagnosis not present

## 2015-10-01 DIAGNOSIS — E785 Hyperlipidemia, unspecified: Secondary | ICD-10-CM | POA: Diagnosis not present

## 2015-10-02 LAB — HEMOGLOBIN A1C
ESTIMATED AVERAGE GLUCOSE: 143 mg/dL
Hgb A1c MFr Bld: 6.6 % — ABNORMAL HIGH (ref 4.8–5.6)

## 2015-10-02 LAB — LIPID PANEL
CHOL/HDL RATIO: 2.8 ratio (ref 0.0–5.0)
Cholesterol, Total: 146 mg/dL (ref 100–199)
HDL: 52 mg/dL (ref >39)
LDL CALC: 68 mg/dL (ref 0–99)
Triglycerides: 129 mg/dL (ref 0–149)
VLDL Cholesterol Cal: 26 mg/dL (ref 5–40)

## 2015-10-02 LAB — BASIC METABOLIC PANEL
BUN/Creatinine Ratio: 9 — ABNORMAL LOW (ref 10–24)
BUN: 11 mg/dL (ref 8–27)
CALCIUM: 9.2 mg/dL (ref 8.6–10.2)
CHLORIDE: 102 mmol/L (ref 96–106)
CO2: 21 mmol/L (ref 18–29)
Creatinine, Ser: 1.16 mg/dL (ref 0.76–1.27)
GFR calc Af Amer: 67 mL/min/{1.73_m2} (ref >59)
GFR, EST NON AFRICAN AMERICAN: 58 mL/min/{1.73_m2} — AB (ref >59)
Glucose: 137 mg/dL — ABNORMAL HIGH (ref 65–99)
Potassium: 4.4 mmol/L (ref 3.5–5.2)
Sodium: 141 mmol/L (ref 134–144)

## 2015-10-02 LAB — HEPATIC FUNCTION PANEL
ALBUMIN: 4.3 g/dL (ref 3.5–4.7)
ALT: 12 IU/L (ref 0–44)
AST: 16 IU/L (ref 0–40)
Alkaline Phosphatase: 49 IU/L (ref 39–117)
BILIRUBIN TOTAL: 0.5 mg/dL (ref 0.0–1.2)
Bilirubin, Direct: 0.14 mg/dL (ref 0.00–0.40)
Total Protein: 7.2 g/dL (ref 6.0–8.5)

## 2015-10-02 LAB — MICROALBUMIN / CREATININE URINE RATIO
CREATININE, UR: 93.3 mg/dL
MICROALB/CREAT RATIO: 10.2 mg/g creat (ref 0.0–30.0)
MICROALBUM., U, RANDOM: 9.5 ug/mL

## 2015-10-14 DIAGNOSIS — Z85828 Personal history of other malignant neoplasm of skin: Secondary | ICD-10-CM | POA: Diagnosis not present

## 2015-10-14 DIAGNOSIS — Z08 Encounter for follow-up examination after completed treatment for malignant neoplasm: Secondary | ICD-10-CM | POA: Diagnosis not present

## 2015-10-14 DIAGNOSIS — L570 Actinic keratosis: Secondary | ICD-10-CM | POA: Diagnosis not present

## 2015-10-14 DIAGNOSIS — X32XXXD Exposure to sunlight, subsequent encounter: Secondary | ICD-10-CM | POA: Diagnosis not present

## 2015-10-15 DIAGNOSIS — B351 Tinea unguium: Secondary | ICD-10-CM | POA: Diagnosis not present

## 2015-10-15 DIAGNOSIS — L851 Acquired keratosis [keratoderma] palmaris et plantaris: Secondary | ICD-10-CM | POA: Diagnosis not present

## 2015-10-15 DIAGNOSIS — E1142 Type 2 diabetes mellitus with diabetic polyneuropathy: Secondary | ICD-10-CM | POA: Diagnosis not present

## 2015-10-22 ENCOUNTER — Ambulatory Visit (INDEPENDENT_AMBULATORY_CARE_PROVIDER_SITE_OTHER): Payer: Medicare Other | Admitting: Family Medicine

## 2015-10-22 ENCOUNTER — Encounter: Payer: Self-pay | Admitting: Family Medicine

## 2015-10-22 VITALS — BP 128/76 | Ht 71.0 in | Wt 220.0 lb

## 2015-10-22 DIAGNOSIS — I519 Heart disease, unspecified: Secondary | ICD-10-CM

## 2015-10-22 DIAGNOSIS — E1169 Type 2 diabetes mellitus with other specified complication: Secondary | ICD-10-CM

## 2015-10-22 DIAGNOSIS — I2510 Atherosclerotic heart disease of native coronary artery without angina pectoris: Secondary | ICD-10-CM | POA: Diagnosis not present

## 2015-10-22 DIAGNOSIS — I10 Essential (primary) hypertension: Secondary | ICD-10-CM | POA: Diagnosis not present

## 2015-10-22 DIAGNOSIS — E785 Hyperlipidemia, unspecified: Secondary | ICD-10-CM

## 2015-10-22 DIAGNOSIS — I5022 Chronic systolic (congestive) heart failure: Secondary | ICD-10-CM

## 2015-10-22 NOTE — Progress Notes (Signed)
   Subjective:    Patient ID: Anthony Lucas, male    DOB: [birth year], 82 y.o.   MRN: [id number]  Diabetes He presents for his follow-up diabetic visit. He has type 2 diabetes mellitus. Pertinent negatives for hypoglycemia include no confusion. Pertinent negatives for diabetes include no chest pain, no fatigue, no polydipsia, no polyphagia and no weakness. He is following a diabetic diet. Exercise: does not exercise because of bad knee. Home blood sugar record trend: pt does not check blood sugar. He sees a podiatrist.Eye exam is not current.   A1C 6.6 on 10/01/15.   Pt states he is weak. Saw Dr. Film/video editor on 09/12/15. Pt states he was told he had heart attack and to take it easy.  Denies any low sugar spells Echo was reviewed with the patient lying byline Recent labs reviewed with the patient lying byline Patient also one in his medications reviewed with him we went over each one Review of Systems  Constitutional: Negative for activity change, appetite change and fatigue.  HENT: Negative for congestion.   Respiratory: Negative for cough.   Cardiovascular: Negative for chest pain.  Gastrointestinal: Negative for abdominal pain.  Endocrine: Negative for polydipsia and polyphagia.  Neurological: Negative for weakness.  Psychiatric/Behavioral: Negative for confusion.   25 minutes was spent with the patient. Greater than half the time was spent in discussion and answering questions and counseling regarding the issues that the patient came in for today.     Objective:   Physical Exam  Constitutional: He appears well-nourished. No distress.  Cardiovascular: Normal rate, regular rhythm and normal heart sounds.   No murmur heard. Pulmonary/Chest: Effort normal and breath sounds normal. No respiratory distress.  Musculoskeletal: He exhibits no edema.  Lymphadenopathy:    He has no cervical adenopathy.  Neurological: He is alert.  Psychiatric: His behavior is normal.  Vitals  reviewed.  Lungs clear hearts were pulse normal extremities no edema skin warm dry neurologic gross normal       Assessment & Plan:  Severe CHF compensated. On medications. Tolerating it well. More than likely will get a defibrillator placed. Will see Dr. Lovena Le in the near future.  Diabetes good control continue current measures patient not having any low sugar spells  Hyperlipidemia under good control lab work was reviewed with the patient  Hypertension good control medications were reviewed.  Follow-up mid-December

## 2015-10-22 NOTE — Progress Notes (Signed)
The soonest appointment they had was November 27, 2015 at 10:15 am.  I had Dr. Tanna Furry office change to this appointment and I will notify the patient.

## 2015-10-25 ENCOUNTER — Other Ambulatory Visit: Payer: Self-pay | Admitting: Family Medicine

## 2015-11-01 ENCOUNTER — Ambulatory Visit: Payer: Medicare Other | Admitting: Gastroenterology

## 2015-11-01 ENCOUNTER — Telehealth: Payer: Self-pay | Admitting: Gastroenterology

## 2015-11-01 NOTE — Telephone Encounter (Signed)
Pt was a no show

## 2015-11-01 NOTE — Telephone Encounter (Signed)
Mailed letter °

## 2015-11-12 ENCOUNTER — Encounter: Payer: Self-pay | Admitting: Internal Medicine

## 2015-11-27 ENCOUNTER — Encounter: Payer: Self-pay | Admitting: Internal Medicine

## 2015-11-27 ENCOUNTER — Ambulatory Visit (INDEPENDENT_AMBULATORY_CARE_PROVIDER_SITE_OTHER): Payer: Medicare Other | Admitting: Internal Medicine

## 2015-11-27 VITALS — BP 144/90 | HR 81 | Ht 73.0 in | Wt 224.6 lb

## 2015-11-27 DIAGNOSIS — I5022 Chronic systolic (congestive) heart failure: Secondary | ICD-10-CM

## 2015-11-27 DIAGNOSIS — I2510 Atherosclerotic heart disease of native coronary artery without angina pectoris: Secondary | ICD-10-CM | POA: Diagnosis not present

## 2015-11-27 NOTE — Progress Notes (Signed)
HPI [name] is referred today by Dr. Jacinta Shoe to consider BiV device implant. He is a pleasant 82 yo man with a h/o chronic systolic heart failure, class 2, who has severe LV dysfunction. He is on maximal medical therapy. He has never had syncope. He denies edema. He has not been hospitalized with heart failure. He takes a diuretic to control his volume. He has had LV dysfunction for at least a year. Allergies  Allergen Reactions  . Bee Venom Anaphylaxis    Honey bee      Current Outpatient Prescriptions  Medication Sig Dispense Refill  . allopurinol (ZYLOPRIM) 100 MG tablet Take 100 mg by mouth daily.    Marland Kitchen aspirin EC 81 MG tablet Take 81 mg by mouth daily. Reported on 08/29/2015    . finasteride (PROSCAR) 5 MG tablet TAKE ONE TABLET BY MOUTH ONCE DAILY. 30 tablet 5  . furosemide (LASIX) 20 MG tablet TAKE (1) TABLET BY MOUTH EACH MORNING. 30 tablet 5  . metFORMIN (GLUCOPHAGE) 500 MG tablet TAKE ONE TABLET BY MOUTH DAILY. 30 tablet 5  . metoprolol succinate (TOPROL XL) 25 MG 24 hr tablet Take 1 tablet (25 mg total) by mouth daily. 90 tablet 3  . potassium chloride (K-DUR) 10 MEQ tablet TAKE 2 TABLETS BY MOUTH DAILY. 60 tablet 5  . sacubitril-valsartan (ENTRESTO) 24-26 MG Take 1 tablet by mouth 2 (two) times daily. 60 tablet 6  . simvastatin (ZOCOR) 40 MG tablet TAKE 1 TABLET BY MOUTH ONCE DAILY. 30 tablet 5   Current Facility-Administered Medications  Medication Dose Route Frequency Provider Last Rate Last Dose  . 0.9 %  sodium chloride infusion  500 mL Intravenous Continuous Sable Feil, MD         Past Medical History:  Diagnosis Date  . CHF (congestive heart failure) ([place])    Abnormal echo January 2017  . Diabetes mellitus   . History of colon polyps   . Hyperlipidemia   . Hypertension   . Seasonal allergies   . Skin cancer   . Sleep apnea    supposed to wear CPAP, but doesn't    ROS:   All systems reviewed and negative except as noted in the  HPI.   Past Surgical History:  Procedure Laterality Date  . CATARACT EXTRACTION W/PHACO Right 09/08/2012   Procedure: CATARACT EXTRACTION PHACO AND INTRAOCULAR LENS PLACEMENT (IOC);  Surgeon: Tonny Branch, MD;  Location: [facility];  Service: Ophthalmology;  Laterality: Right;  CDE: 21.28  . CATARACT EXTRACTION W/PHACO Left 12/08/2012   Procedure: CATARACT EXTRACTION PHACO AND INTRAOCULAR LENS PLACEMENT (IOC);  Surgeon: Tonny Branch, MD;  Location: [facility];  Service: Ophthalmology;  Laterality: Left;  CDE:25.72  . CHOLECYSTECTOMY  1968  . COLONOSCOPY     4 procedures  . COLONOSCOPY  2012   Dr. Sharlett Iles: diverticulosis, lipoma in ascending colon.   Marland Kitchen POLYPECTOMY    . PROSTATE SURGERY     turp  . TOTAL KNEE ARTHROPLASTY    . TRANSURETHRAL RESECTION OF PROSTATE       Family History  Problem Relation Age of Onset  . Lung cancer Father   . Colon cancer Neg Hx      Social History   Social History  . Marital status: Married    Spouse name: N/A  . Number of children: N/A  . Years of education: N/A   Occupational History  . Not on file.   Social History Main Topics  . Smoking status:  Never Smoker  . Smokeless tobacco: Never Used  . Alcohol use 4.2 oz/week    7 Standard drinks or equivalent per week     Comment: bourboun 3-4oz a day  . Drug use: No  . Sexual activity: Yes    Birth control/ protection: None   Other Topics Concern  . Not on file   Social History Narrative  . No narrative on file     BP (!) 144/90   Pulse 81   Ht 6\' 1"  (1.854 m)   Wt 224 lb 9.6 oz (101.9 kg)   BMI 29.63 kg/m   Physical Exam:  Well appearing 82 yo man, looks younger than his stated age, NAD HEENT: Unremarkable Neck:  6 cm JVD, no thyromegally Lymphatics:  No adenopathy Back:  No CVA tenderness Lungs:  Clear with no wheezes or rales HEART:  Regular rate rhythm, no murmurs, no rubs, no clicks, split S2. Abd:  soft, positive bowel sounds, no organomegally, no rebound, no guarding Ext:  2  plus pulses, no edema, no cyanosis, no clubbing Skin:  No rashes no nodules Neuro:  CN II through XII intact, motor grossly intact  EKG - NSR with LBBB, PVC's  Assess/Plan: 1. Chronic systolic heart failure - his symptoms are class 2. He is on maximal medical therapy. I discussed treatment options. His advanced age makes a prophylactic ICD for primary prevention relatively contraindicated. I did discuss BiV PPM insertion. He is considering his options and will call us if he would like to proceed with an implant. 2. LBBB - he denies a h/o syncope. Will follow. 3. HTN - his blood pressure is elevated. After his device is placed, we could consider uptitrating his beta blocker. 4. CAD - his has an inferior scar on stress testing. He does not have any anginal symptoms. At this point, I think it is reasonable to hold off on cardiac cath before any biv procedure.  Mikle Bosworth.D.

## 2015-11-27 NOTE — Patient Instructions (Signed)
Medication Instructions:  Your physician recommends that you continue on your current medications as directed. Please refer to the Current Medication list given to you today.   Labwork: None ordered   Testing/Procedures: Your physician has recommended that you have a Bi-V Pacemaker inserted. A pacemaker is a small device that is placed under the skin of your chest or abdomen to help control abnormal heart rhythms. This device uses electrical pulses to prompt the heart to beat at a normal rate. Pacemakers are used to treat heart rhythms that are too slow. Wire (leads) are attached to the pacemaker that goes into the chambers of you heart. This is done in the hospital and usually requires and overnight stay. Please see the instruction sheet given to you today for more information.---Bi-V Pacemaker    Dates for procedure: 8/30,9/13,9/14,9/20,9/21,9/25,9/29   Follow-Up:  Your physician recommends that you schedule a follow-up appointment--- will be scheduled if proceeds with implant.  If no implant will follow up as needed   Biventricular Pacemaker Implantation A pacemaker is a small, lightweight, battery-powered device that is placed (implanted) under the skin in the upper chest. Your health care provider may prescribe a pacemaker for you if your heartbeat is too slow (bradycardia). A biventricular pacemaker is a pulse generator connected by wires called leads that go into the two lower chambers on the right and left sides of your heart (ventricles). It is used to treat symptoms of heart failure. The pulse generator is a small computer run by a battery. The generator creates a regular electronic pulse. The pulse is sent through the leads, which go through a blood vessel and into the ventricles of your heart. This type of pacemaker makes a weak heart more efficient. LET [facility] CARE PROVIDER KNOW ABOUT:  Any allergies. Some allergies can cause serious problems during the procedure. Allergies  to shellfish or agents, such as iodine, used in liquids that enhance specific areas of your body on X-ray images (contrast dyes) are especially problematic.   All medicines you take. These include vitamins, herbs, eye drops, over-the-counter medicines, and creams.   Use of steroids.   Problems with numbing medicines (anesthetics).  Bleeding problems.   Past surgeries.   Other health problems. RISKS AND COMPLICATIONS Implanting a biventricular pacemaker is usually a safe procedure but problems can occur. For example:   Too much bleeding may occur.   Infection may develop.   Blood vessels, your lungs, or your heart may be harmed.   The pacemaker may not make your condition better. BEFORE THE PROCEDURE   You may need to have blood tests, heart tests, or a chest X-ray done before the day of the procedure.   Ask your health care provider about changing or stopping your regular medicines.   Make plans to have someone drive you home.  Stop smoking at least 24 hours before the procedure.  Take a bath or shower the night before the procedure. You may need to scrub your chest with a special type of soap.  Do not eat or drink anything after midnight the night before your procedure. Ask if it is okay to take any needed medicine with a small sip of water. PROCEDURE The procedure to put a pacemaker in your chest is usually done at a hospital in a room that has a large X-ray machine called a fluoroscope. The machine will be above you during the procedure. It will help your health care provider see your heart during the procedure. Before the  procedure:   Small monitors will be put on your body. They will be used to check your heart, blood pressure, and oxygen level.  A needle will be put into a vein in your hand or arm. This is called an intravenous (IV) access tube. Fluids and medicine will flow directly into your body through the IV tube.  Your chest will be cleaned with a  germ-killing (antiseptic) solution. Your chest may be shaved.  You may be given medicine to help you relax (sedative).   You will be given a numbing medicine called a local anesthetic. This medicine will make your chest area have no feeling while the pacemaker is implanted. You will be sleepy but awake during the procedure. After you are numb, the procedure will begin. The health care provider will:   Make a small cut (incision). This will make a pocket deep under your skin that will hold the pulse generator.  Guide the leads through a large blood vessel into your heart and attach them to the heart muscles.  Test the pacemaker.  Close the incision with stitches, glue, or staples. AFTER THE PROCEDURE  You may feel pain. Some pain is normal. It may last a few days.   You may stay in a recovery area until the local anesthetic has worn off. Your blood pressure and pulse will be checked often. You will be taken to a room where your heartbeat will be monitored.  A chest X-ray will be taken. This checks that the pacemaker is in the right place.  The pacemaker will be checked before you go home. It can be adjusted if that is needed.   This information is not intended to replace advice given to you by your health care provider. Make sure you discuss any questions you have with your health care provider.   Document Released: 12/16/2011 Document Revised: 04/13/2014 Document Reviewed: 12/16/2011 Elsevier Interactive Patient Education 2016 [place] you for choosing [facility]!!     Janan Halter, RN [phone]

## 2015-11-29 ENCOUNTER — Institutional Professional Consult (permissible substitution): Payer: Medicare Other | Admitting: Internal Medicine

## 2015-11-30 ENCOUNTER — Other Ambulatory Visit: Payer: Self-pay | Admitting: Cardiovascular Disease

## 2015-12-02 ENCOUNTER — Telehealth: Payer: Self-pay | Admitting: Internal Medicine

## 2015-12-02 DIAGNOSIS — I447 Left bundle-branch block, unspecified: Secondary | ICD-10-CM

## 2015-12-02 DIAGNOSIS — I5022 Chronic systolic (congestive) heart failure: Secondary | ICD-10-CM

## 2015-12-02 NOTE — Telephone Encounter (Signed)
Spoke with wife and I will scheduled his Bi V  PPM implant for 9/14 at 7:30am  Be at the hospital at 5:30am, check in at the [facility], NPO after midnight. He will get pre-op labs on 9/7 at 11am

## 2015-12-02 NOTE — Telephone Encounter (Signed)
Pt's wife calling to schedule pacemaker, would like 9-14 or 9-21-pls call.

## 2015-12-03 ENCOUNTER — Other Ambulatory Visit: Payer: Self-pay

## 2015-12-11 ENCOUNTER — Other Ambulatory Visit: Payer: Self-pay | Admitting: Family Medicine

## 2015-12-12 ENCOUNTER — Other Ambulatory Visit: Payer: Medicare Other | Admitting: *Deleted

## 2015-12-12 DIAGNOSIS — I447 Left bundle-branch block, unspecified: Secondary | ICD-10-CM

## 2015-12-12 DIAGNOSIS — I5022 Chronic systolic (congestive) heart failure: Secondary | ICD-10-CM | POA: Diagnosis not present

## 2015-12-12 LAB — CBC WITH DIFFERENTIAL/PLATELET
BASOS ABS: 0 {cells}/uL (ref 0–200)
Basophils Relative: 0 %
EOS ABS: 360 {cells}/uL (ref 15–500)
Eosinophils Relative: 5 %
HEMATOCRIT: 43 % (ref 38.5–50.0)
HEMOGLOBIN: 14.3 g/dL (ref 13.2–17.1)
LYMPHS ABS: 1080 {cells}/uL (ref 850–3900)
Lymphocytes Relative: 15 %
MCH: 27.6 pg (ref 27.0–33.0)
MCHC: 33.3 g/dL (ref 32.0–36.0)
MCV: 82.9 fL (ref 80.0–100.0)
MONO ABS: 648 {cells}/uL (ref 200–950)
MPV: 10.1 fL (ref 7.5–12.5)
Monocytes Relative: 9 %
NEUTROS PCT: 71 %
Neutro Abs: 5112 cells/uL (ref 1500–7800)
Platelets: 157 10*3/uL (ref 140–400)
RBC: 5.19 MIL/uL (ref 4.20–5.80)
RDW: 14.2 % (ref 11.0–15.0)
WBC: 7.2 10*3/uL (ref 3.8–10.8)

## 2015-12-12 LAB — BASIC METABOLIC PANEL
BUN: 18 mg/dL (ref 7–25)
CO2: 26 mmol/L (ref 20–31)
Calcium: 9.1 mg/dL (ref 8.6–10.3)
Chloride: 105 mmol/L (ref 98–110)
Creat: 1.17 mg/dL — ABNORMAL HIGH (ref 0.70–1.11)
Glucose, Bld: 132 mg/dL — ABNORMAL HIGH (ref 65–99)
POTASSIUM: 4.5 mmol/L (ref 3.5–5.3)
SODIUM: 140 mmol/L (ref 135–146)

## 2015-12-19 ENCOUNTER — Encounter (HOSPITAL_COMMUNITY): Payer: Self-pay | Admitting: General Practice

## 2015-12-19 ENCOUNTER — Ambulatory Visit (HOSPITAL_COMMUNITY)
Admission: RE | Admit: 2015-12-19 | Discharge: 2015-12-20 | Disposition: A | Discharge status: Home / Self Care | Payer: Medicare Other | Source: Ambulatory Visit | Attending: Internal Medicine | Admitting: Internal Medicine

## 2015-12-19 ENCOUNTER — Encounter (HOSPITAL_COMMUNITY)
Admission: RE | Disposition: A | Discharge status: Home / Self Care | Payer: Self-pay | Source: Ambulatory Visit | Attending: Internal Medicine

## 2015-12-19 DIAGNOSIS — I110 Hypertensive heart disease with heart failure: Secondary | ICD-10-CM | POA: Diagnosis not present

## 2015-12-19 DIAGNOSIS — I5022 Chronic systolic (congestive) heart failure: Secondary | ICD-10-CM

## 2015-12-19 DIAGNOSIS — I2510 Atherosclerotic heart disease of native coronary artery without angina pectoris: Secondary | ICD-10-CM | POA: Diagnosis not present

## 2015-12-19 DIAGNOSIS — E785 Hyperlipidemia, unspecified: Secondary | ICD-10-CM | POA: Insufficient documentation

## 2015-12-19 DIAGNOSIS — I252 Old myocardial infarction: Secondary | ICD-10-CM | POA: Diagnosis not present

## 2015-12-19 DIAGNOSIS — G4733 Obstructive sleep apnea (adult) (pediatric): Secondary | ICD-10-CM | POA: Insufficient documentation

## 2015-12-19 DIAGNOSIS — Z96659 Presence of unspecified artificial knee joint: Secondary | ICD-10-CM | POA: Insufficient documentation

## 2015-12-19 DIAGNOSIS — Z7984 Long term (current) use of oral hypoglycemic drugs: Secondary | ICD-10-CM | POA: Insufficient documentation

## 2015-12-19 DIAGNOSIS — Z85828 Personal history of other malignant neoplasm of skin: Secondary | ICD-10-CM | POA: Diagnosis not present

## 2015-12-19 DIAGNOSIS — Z950 Presence of cardiac pacemaker: Secondary | ICD-10-CM

## 2015-12-19 DIAGNOSIS — I447 Left bundle-branch block, unspecified: Secondary | ICD-10-CM | POA: Insufficient documentation

## 2015-12-19 DIAGNOSIS — E119 Type 2 diabetes mellitus without complications: Secondary | ICD-10-CM | POA: Diagnosis not present

## 2015-12-19 DIAGNOSIS — Z7982 Long term (current) use of aspirin: Secondary | ICD-10-CM | POA: Diagnosis not present

## 2015-12-19 DIAGNOSIS — Z1211 Encounter for screening for malignant neoplasm of colon: Secondary | ICD-10-CM

## 2015-12-19 DIAGNOSIS — I429 Cardiomyopathy, unspecified: Secondary | ICD-10-CM | POA: Diagnosis not present

## 2015-12-19 HISTORY — PX: INSERT / REPLACE / REMOVE PACEMAKER: SUR710

## 2015-12-19 HISTORY — DX: Type 2 diabetes mellitus without complications: E11.9

## 2015-12-19 HISTORY — DX: Unspecified osteoarthritis, unspecified site: M19.90

## 2015-12-19 HISTORY — PX: EP IMPLANTABLE DEVICE: SHX172B

## 2015-12-19 HISTORY — DX: Acute myocardial infarction, unspecified: I21.9

## 2015-12-19 HISTORY — DX: Presence of cardiac pacemaker: Z95.0

## 2015-12-19 HISTORY — DX: Personal history of other diseases of the musculoskeletal system and connective tissue: Z87.39

## 2015-12-19 LAB — SURGICAL PCR SCREEN
MRSA, PCR: NEGATIVE
Staphylococcus aureus: NEGATIVE

## 2015-12-19 LAB — GLUCOSE, CAPILLARY
GLUCOSE-CAPILLARY: 143 mg/dL — AB (ref 65–99)
Glucose-Capillary: 132 mg/dL — ABNORMAL HIGH (ref 65–99)
Glucose-Capillary: 121 mg/dL — ABNORMAL HIGH (ref 65–99)
Glucose-Capillary: 148 mg/dL — ABNORMAL HIGH (ref 65–99)

## 2015-12-19 SURGERY — BIV PACEMAKER INSERTION CRT-P

## 2015-12-19 MED ORDER — LIDOCAINE HCL (PF) 1 % IJ SOLN
INTRAMUSCULAR | Status: AC
  Filled 2015-12-19: qty 60

## 2015-12-19 MED ORDER — ONDANSETRON HCL 4 MG/2ML IJ SOLN: 4.0000 mg | Freq: Four times a day (QID) | INTRAMUSCULAR | Status: DC | PRN

## 2015-12-19 MED ORDER — ASPIRIN EC 81 MG PO TBEC
81.0000 mg | DELAYED_RELEASE_TABLET | Freq: Every day | ORAL | Status: DC
  Administered 2015-12-19: 15:00:00 81 mg via ORAL
  Filled 2015-12-19 (×2): qty 1

## 2015-12-19 MED ORDER — LIDOCAINE HCL (PF) 1 % IJ SOLN
INTRAMUSCULAR | Status: DC | PRN
  Administered 2015-12-19: 30 mL via INTRADERMAL

## 2015-12-19 MED ORDER — MUPIROCIN 2 % EX OINT
TOPICAL_OINTMENT | CUTANEOUS | Status: AC
Start: 2015-12-19 — End: 2015-12-19
  Filled 2015-12-19: qty 22

## 2015-12-19 MED ORDER — MIDAZOLAM HCL 5 MG/5ML IJ SOLN
INTRAMUSCULAR | Status: AC
  Filled 2015-12-19: qty 5

## 2015-12-19 MED ORDER — HEPARIN (PORCINE) IN NACL 2-0.9 UNIT/ML-% IJ SOLN
INTRAMUSCULAR | Status: AC
  Filled 2015-12-19: qty 500

## 2015-12-19 MED ORDER — YOU HAVE A PACEMAKER BOOK
Freq: Once | Status: AC
  Administered 2015-12-19: 20:00:00
  Filled 2015-12-19: qty 1

## 2015-12-19 MED ORDER — SODIUM CHLORIDE 0.9 % IV SOLN
INTRAVENOUS | Status: DC
  Administered 2015-12-19: 06:00:00 via INTRAVENOUS

## 2015-12-19 MED ORDER — INFLUENZA VAC SPLIT QUAD 0.5 ML IM SUSY
0.5000 mL | PREFILLED_SYRINGE | INTRAMUSCULAR | Status: DC
  Filled 2015-12-19: qty 0.5

## 2015-12-19 MED ORDER — IOPAMIDOL (ISOVUE-370) INJECTION 76%
INTRAVENOUS | Status: AC
  Filled 2015-12-19: qty 50

## 2015-12-19 MED ORDER — MIDAZOLAM HCL 5 MG/5ML IJ SOLN
INTRAMUSCULAR | Status: DC | PRN
  Administered 2015-12-19 (×7): 1 mg via INTRAVENOUS

## 2015-12-19 MED ORDER — ACETAMINOPHEN 325 MG PO TABS
325.0000 mg | ORAL_TABLET | ORAL | Status: DC | PRN
  Administered 2015-12-19: 22:00:00 650 mg via ORAL
  Filled 2015-12-19: qty 2

## 2015-12-19 MED ORDER — SIMVASTATIN 20 MG PO TABS
40.0000 mg | ORAL_TABLET | Freq: Every day | ORAL | Status: DC
  Administered 2015-12-19: 40 mg via ORAL
  Filled 2015-12-19 (×2): qty 2

## 2015-12-19 MED ORDER — SODIUM CHLORIDE 0.9 % IR SOLN
Status: AC
  Filled 2015-12-19: qty 2

## 2015-12-19 MED ORDER — CEFAZOLIN SODIUM-DEXTROSE 2-3 GM-% IV SOLR
INTRAVENOUS | Status: DC | PRN
  Administered 2015-12-19: 2 g via INTRAVENOUS

## 2015-12-19 MED ORDER — METFORMIN HCL 500 MG PO TABS: 500.0000 mg | ORAL_TABLET | Freq: Every day | ORAL | Status: DC

## 2015-12-19 MED ORDER — CEFAZOLIN IN D5W 1 GM/50ML IV SOLN
1.0000 g | Freq: Four times a day (QID) | INTRAVENOUS | Status: AC
  Administered 2015-12-19 – 2015-12-20 (×3): 1 g via INTRAVENOUS
  Filled 2015-12-19 (×3): qty 50

## 2015-12-19 MED ORDER — METOPROLOL SUCCINATE ER 25 MG PO TB24
25.0000 mg | ORAL_TABLET | Freq: Every day | ORAL | Status: DC
  Administered 2015-12-19: 15:00:00 25 mg via ORAL
  Filled 2015-12-19 (×2): qty 1

## 2015-12-19 MED ORDER — POTASSIUM CHLORIDE ER 10 MEQ PO TBCR
20.0000 meq | EXTENDED_RELEASE_TABLET | Freq: Every day | ORAL | Status: DC
  Administered 2015-12-19: 20 meq via ORAL
  Filled 2015-12-19 (×4): qty 2

## 2015-12-19 MED ORDER — IOPAMIDOL (ISOVUE-370) INJECTION 76%
INTRAVENOUS | Status: DC | PRN
  Administered 2015-12-19: 50 mL

## 2015-12-19 MED ORDER — SODIUM CHLORIDE 0.9 % IV SOLN
500.0000 mL | INTRAVENOUS | Status: DC
  Administered 2015-12-19: 13:00:00 500 mL via INTRAVENOUS

## 2015-12-19 MED ORDER — HEPARIN (PORCINE) IN NACL 2-0.9 UNIT/ML-% IJ SOLN
INTRAMUSCULAR | Status: DC | PRN
  Administered 2015-12-19: 500 mL

## 2015-12-19 MED ORDER — CHLORHEXIDINE GLUCONATE 4 % EX LIQD: 60.0000 mL | Freq: Once | CUTANEOUS | Status: DC

## 2015-12-19 MED ORDER — FENTANYL CITRATE (PF) 100 MCG/2ML IJ SOLN
INTRAMUSCULAR | Status: AC
  Filled 2015-12-19: qty 2

## 2015-12-19 MED ORDER — FINASTERIDE 5 MG PO TABS
5.0000 mg | ORAL_TABLET | Freq: Every day | ORAL | Status: DC
  Administered 2015-12-19: 15:00:00 5 mg via ORAL
  Filled 2015-12-19 (×2): qty 1

## 2015-12-19 MED ORDER — MUPIROCIN 2 % EX OINT
1.0000 "application " | TOPICAL_OINTMENT | Freq: Once | CUTANEOUS | Status: AC
  Administered 2015-12-19: 1 via TOPICAL
  Filled 2015-12-19: qty 22

## 2015-12-19 MED ORDER — SACUBITRIL-VALSARTAN 24-26 MG PO TABS
1.0000 | ORAL_TABLET | Freq: Two times a day (BID) | ORAL | Status: DC
  Administered 2015-12-19 (×2): 1 via ORAL
  Filled 2015-12-19 (×3): qty 1

## 2015-12-19 MED ORDER — ZOLPIDEM TARTRATE 5 MG PO TABS
5.0000 mg | ORAL_TABLET | Freq: Every evening | ORAL | Status: DC | PRN
  Administered 2015-12-19: 22:00:00 5 mg via ORAL
  Filled 2015-12-19: qty 1

## 2015-12-19 MED ORDER — CEFAZOLIN SODIUM-DEXTROSE 2-4 GM/100ML-% IV SOLN
INTRAVENOUS | Status: AC
  Filled 2015-12-19: qty 100

## 2015-12-19 MED ORDER — SODIUM CHLORIDE 0.9 % IR SOLN
80.0000 mg | Status: AC
  Administered 2015-12-19: 80 mg
  Filled 2015-12-19: qty 2

## 2015-12-19 MED ORDER — TURMERIC 500 MG PO CAPS: 1.0000 | ORAL_CAPSULE | Freq: Every day | ORAL | Status: DC

## 2015-12-19 MED ORDER — CEFAZOLIN SODIUM-DEXTROSE 2-4 GM/100ML-% IV SOLN: 2.0000 g | INTRAVENOUS | Status: DC

## 2015-12-19 MED ORDER — FUROSEMIDE 20 MG PO TABS
20.0000 mg | ORAL_TABLET | Freq: Every day | ORAL | Status: DC
  Administered 2015-12-19: 20 mg via ORAL
  Filled 2015-12-19 (×2): qty 1

## 2015-12-19 MED ORDER — ALLOPURINOL 100 MG PO TABS
100.0000 mg | ORAL_TABLET | Freq: Every day | ORAL | Status: DC
  Administered 2015-12-19: 100 mg via ORAL
  Filled 2015-12-19 (×2): qty 1

## 2015-12-19 MED ORDER — FENTANYL CITRATE (PF) 100 MCG/2ML IJ SOLN
INTRAMUSCULAR | Status: DC | PRN
  Administered 2015-12-19 (×6): 25 ug via INTRAVENOUS

## 2015-12-19 SURGICAL SUPPLY — 19 items
ADAPTER SEALING SSA-EW-09 (MISCELLANEOUS) ×4 IMPLANT
ADPR INTRO LNG 9FR SL XTD WNG (MISCELLANEOUS) ×2
CABLE SURGICAL S-101-97-12 (CABLE) ×2 IMPLANT
CATH ATTAIN COM SURV 6250V-MB2 (CATHETERS) ×2 IMPLANT
CATH ATTAIN SEL SURV 6248V-90 (CATHETERS) ×2 IMPLANT
CATH ATTAIN SELECT 6238TEL (CATHETERS) ×2 IMPLANT
CATH HEX JOS 2-5-2 65CM 6F REP (CATHETERS) ×2 IMPLANT
DEVICE CRTP PERCEPTA QUAD MRI (Pacemaker) ×2 IMPLANT
LEAD ATTAIN PERFORMA S 4598-88 (Lead) ×2 IMPLANT
LEAD CAPSURE NOVUS 5076-52CM (Lead) ×2 IMPLANT
LEAD CAPSURE NOVUS 5076-58CM (Lead) ×2 IMPLANT
PAD DEFIB LIFELINK (PAD) ×2 IMPLANT
SHEATH CLASSIC 7F (SHEATH) ×4 IMPLANT
SHEATH CLASSIC 9.5F (SHEATH) ×2 IMPLANT
SLITTER 6232ADJ (MISCELLANEOUS) ×2 IMPLANT
TRAY PACEMAKER INSERTION (PACKS) ×2 IMPLANT
WIRE ACUITY WHISPER EDS 4648 (WIRE) ×2 IMPLANT
WIRE LUGE 182CM (WIRE) ×4 IMPLANT
WIRE MAILMAN 182CM (WIRE) ×2 IMPLANT

## 2015-12-19 NOTE — Interval H&P Note (Signed)
History and Physical Interval Note:  12/19/2015 7:29 AM  [name]  has presented today for surgery, with the diagnosis of chf - left bundle  The various methods of treatment have been discussed with the patient and family. After consideration of risks, benefits and other options for treatment, the patient has consented to  Procedure(s): BiV Pacemaker Insertion CRT-P (N/A) as a surgical intervention .  The patient's history has been reviewed, patient examined, no change in status, stable for surgery.  I have reviewed the patient's chart and labs.  Questions were answered to the patient's satisfaction.     Anthony Lucas

## 2015-12-19 NOTE — Progress Notes (Signed)
PHARMACIST - PHYSICIAN ORDER COMMUNICATION  CONCERNING: P&T Medication Policy on Herbal Medications  DESCRIPTION:  This patient's order for:  Tumeric  has been noted.  This product(s) is classified as an "herbal" or natural product. Due to a lack of definitive safety studies or FDA approval, nonstandard manufacturing practices, plus the potential risk of unknown drug-drug interactions while on inpatient medications, the Pharmacy and Therapeutics Committee does not permit the use of "herbal" or natural products of this type within [facility].   ACTION TAKEN: The pharmacy department is unable to verify this order at this time and your patient has been informed of this safety policy. Please reevaluate patient's clinical condition at discharge and address if the herbal or natural product(s) should be resumed at that time.    Gracy Bruins, PharmD Clinical Pharmacist [facility]

## 2015-12-19 NOTE — H&P (View-Only) (Signed)
HPI [name] is referred today by Dr. Jacinta Shoe to consider BiV device implant. He is a pleasant 82 yo man with a h/o chronic systolic heart failure, class 2, who has severe LV dysfunction. He is on maximal medical therapy. He has never had syncope. He denies edema. He has not been hospitalized with heart failure. He takes a diuretic to control his volume. He has had LV dysfunction for at least a year. Allergies  Allergen Reactions  . Bee Venom Anaphylaxis    Honey bee      Current Outpatient Prescriptions  Medication Sig Dispense Refill  . allopurinol (ZYLOPRIM) 100 MG tablet Take 100 mg by mouth daily.    Marland Kitchen aspirin EC 81 MG tablet Take 81 mg by mouth daily. Reported on 08/29/2015    . finasteride (PROSCAR) 5 MG tablet TAKE ONE TABLET BY MOUTH ONCE DAILY. 30 tablet 5  . furosemide (LASIX) 20 MG tablet TAKE (1) TABLET BY MOUTH EACH MORNING. 30 tablet 5  . metFORMIN (GLUCOPHAGE) 500 MG tablet TAKE ONE TABLET BY MOUTH DAILY. 30 tablet 5  . metoprolol succinate (TOPROL XL) 25 MG 24 hr tablet Take 1 tablet (25 mg total) by mouth daily. 90 tablet 3  . potassium chloride (K-DUR) 10 MEQ tablet TAKE 2 TABLETS BY MOUTH DAILY. 60 tablet 5  . sacubitril-valsartan (ENTRESTO) 24-26 MG Take 1 tablet by mouth 2 (two) times daily. 60 tablet 6  . simvastatin (ZOCOR) 40 MG tablet TAKE 1 TABLET BY MOUTH ONCE DAILY. 30 tablet 5   Current Facility-Administered Medications  Medication Dose Route Frequency Provider Last Rate Last Dose  . 0.9 %  sodium chloride infusion  500 mL Intravenous Continuous Sable Feil, MD         Past Medical History:  Diagnosis Date  . CHF (congestive heart failure) ([place])    Abnormal echo January 2017  . Diabetes mellitus   . History of colon polyps   . Hyperlipidemia   . Hypertension   . Seasonal allergies   . Skin cancer   . Sleep apnea    supposed to wear CPAP, but doesn't    ROS:   All systems reviewed and negative except as noted in the  HPI.   Past Surgical History:  Procedure Laterality Date  . CATARACT EXTRACTION W/PHACO Right 09/08/2012   Procedure: CATARACT EXTRACTION PHACO AND INTRAOCULAR LENS PLACEMENT (IOC);  Surgeon: Tonny Branch, MD;  Location: [facility];  Service: Ophthalmology;  Laterality: Right;  CDE: 21.28  . CATARACT EXTRACTION W/PHACO Left 12/08/2012   Procedure: CATARACT EXTRACTION PHACO AND INTRAOCULAR LENS PLACEMENT (IOC);  Surgeon: Tonny Branch, MD;  Location: [facility];  Service: Ophthalmology;  Laterality: Left;  CDE:25.72  . CHOLECYSTECTOMY  1968  . COLONOSCOPY     4 procedures  . COLONOSCOPY  2012   Dr. Sharlett Iles: diverticulosis, lipoma in ascending colon.   Marland Kitchen POLYPECTOMY    . PROSTATE SURGERY     turp  . TOTAL KNEE ARTHROPLASTY    . TRANSURETHRAL RESECTION OF PROSTATE       Family History  Problem Relation Age of Onset  . Lung cancer Father   . Colon cancer Neg Hx      Social History   Social History  . Marital status: Married    Spouse name: N/A  . Number of children: N/A  . Years of education: N/A   Occupational History  . Not on file.   Social History Main Topics  . Smoking status:  Never Smoker  . Smokeless tobacco: Never Used  . Alcohol use 4.2 oz/week    7 Standard drinks or equivalent per week     Comment: bourboun 3-4oz a day  . Drug use: No  . Sexual activity: Yes    Birth control/ protection: None   Other Topics Concern  . Not on file   Social History Narrative  . No narrative on file     BP (!) 144/90   Pulse 81   Ht 6\' 1"  (1.854 m)   Wt 224 lb 9.6 oz (101.9 kg)   BMI 29.63 kg/m   Physical Exam:  Well appearing 82 yo man, looks younger than his stated age, NAD HEENT: Unremarkable Neck:  6 cm JVD, no thyromegally Lymphatics:  No adenopathy Back:  No CVA tenderness Lungs:  Clear with no wheezes or rales HEART:  Regular rate rhythm, no murmurs, no rubs, no clicks, split S2. Abd:  soft, positive bowel sounds, no organomegally, no rebound, no guarding Ext:  2  plus pulses, no edema, no cyanosis, no clubbing Skin:  No rashes no nodules Neuro:  CN II through XII intact, motor grossly intact  EKG - NSR with LBBB, PVC's  Assess/Plan: 1. Chronic systolic heart failure - his symptoms are class 2. He is on maximal medical therapy. I discussed treatment options. His advanced age makes a prophylactic ICD for primary prevention relatively contraindicated. I did discuss BiV PPM insertion. He is considering his options and will call us if he would like to proceed with an implant. 2. LBBB - he denies a h/o syncope. Will follow. 3. HTN - his blood pressure is elevated. After his device is placed, we could consider uptitrating his beta blocker. 4. CAD - his has an inferior scar on stress testing. He does not have any anginal symptoms. At this point, I think it is reasonable to hold off on cardiac cath before any biv procedure.  Anthony Lucas.D.

## 2015-12-19 NOTE — Care Management Note (Addendum)
Case Management Note  Patient Details  Name: BREWSTER KUZARA MRN: [id number] Date of Birth: 03-08-1931  Subjective/Objective:    Patient is s/p  BiV Pacemaker Insertion , he is on entresto.  He lives with spouse at home, pta indep, he has pcp, he has medication coverage and he has transport at dc.  Wife states she will be here at hospital tomorrow a little after 10 am.  NCM will cont to follow for dc needs.               Action/Plan:   Expected Discharge Date:                  Expected Discharge Plan:  Home/Self Care  In-House Referral:     Discharge planning Services  CM Consult  Post Acute Care Choice:    Choice offered to:     DME Arranged:    DME Agency:     HH Arranged:    HH Agency:     Status of Service:  In process, will continue to follow  If discussed at Long Length of Stay Meetings, dates discussed:    Additional Comments:  Zenon Mayo, RN 12/19/2015, 2:15 PM

## 2015-12-20 ENCOUNTER — Ambulatory Visit (HOSPITAL_COMMUNITY): Payer: Medicare Other

## 2015-12-20 ENCOUNTER — Encounter (HOSPITAL_COMMUNITY): Payer: Self-pay | Admitting: Internal Medicine

## 2015-12-20 DIAGNOSIS — I429 Cardiomyopathy, unspecified: Secondary | ICD-10-CM | POA: Diagnosis not present

## 2015-12-20 DIAGNOSIS — E785 Hyperlipidemia, unspecified: Secondary | ICD-10-CM | POA: Diagnosis not present

## 2015-12-20 DIAGNOSIS — I2510 Atherosclerotic heart disease of native coronary artery without angina pectoris: Secondary | ICD-10-CM | POA: Diagnosis not present

## 2015-12-20 DIAGNOSIS — G4733 Obstructive sleep apnea (adult) (pediatric): Secondary | ICD-10-CM | POA: Diagnosis not present

## 2015-12-20 DIAGNOSIS — I447 Left bundle-branch block, unspecified: Secondary | ICD-10-CM | POA: Diagnosis not present

## 2015-12-20 DIAGNOSIS — E119 Type 2 diabetes mellitus without complications: Secondary | ICD-10-CM | POA: Diagnosis not present

## 2015-12-20 DIAGNOSIS — Z85828 Personal history of other malignant neoplasm of skin: Secondary | ICD-10-CM | POA: Diagnosis not present

## 2015-12-20 DIAGNOSIS — Z96659 Presence of unspecified artificial knee joint: Secondary | ICD-10-CM | POA: Diagnosis not present

## 2015-12-20 DIAGNOSIS — Z7984 Long term (current) use of oral hypoglycemic drugs: Secondary | ICD-10-CM | POA: Diagnosis not present

## 2015-12-20 DIAGNOSIS — I110 Hypertensive heart disease with heart failure: Secondary | ICD-10-CM | POA: Diagnosis not present

## 2015-12-20 DIAGNOSIS — Z7982 Long term (current) use of aspirin: Secondary | ICD-10-CM | POA: Diagnosis not present

## 2015-12-20 DIAGNOSIS — I5022 Chronic systolic (congestive) heart failure: Secondary | ICD-10-CM

## 2015-12-20 DIAGNOSIS — J849 Interstitial pulmonary disease, unspecified: Secondary | ICD-10-CM | POA: Diagnosis not present

## 2015-12-20 LAB — GLUCOSE, CAPILLARY: GLUCOSE-CAPILLARY: 147 mg/dL — AB (ref 65–99)

## 2015-12-20 MED ORDER — METOPROLOL SUCCINATE ER 25 MG PO TB24: 50.0000 mg | ORAL_TABLET | Freq: Every day | ORAL | 3 refills | Status: DC

## 2015-12-20 NOTE — Care Management Note (Signed)
Case Management Note  Patient Details  Name: Anthony Lucas MRN: [id number] Date of Birth: February 28, 1931  Subjective/Objective:  Patient is s/p  BiV Pacemaker Insertion , he is on entresto.  He lives with spouse at home, pta indep, he has pcp, he has medication coverage and he has transport at dc.  He is for dc today, no needs.                  Action/Plan:   Expected Discharge Date:                  Expected Discharge Plan:  Home/Self Care  In-House Referral:     Discharge planning Services  CM Consult  Post Acute Care Choice:    Choice offered to:     DME Arranged:    DME Agency:     HH Arranged:    HH Agency:     Status of Service:  Completed, signed off  If discussed at [employer] of Stay Meetings, dates discussed:    Additional Comments:  Zenon Mayo, RN 12/20/2015, 9:08 AM

## 2015-12-20 NOTE — Discharge Instructions (Signed)
° ° °  Supplemental Discharge Instructions for  Pacemaker/Defibrillator Patients  Activity No heavy lifting or vigorous activity with your left/right arm for 6 to 8 weeks.  Do not raise your left/right arm above your head for one week.  Gradually raise your affected arm as drawn below.             12/23/15                     12/24/15                    12/25/15                   12/26/15 __  NO DRIVING for 1 week    ; you may begin driving on  [id number]  .  WOUND CARE - Keep the wound area clean and dry.  Do not get this area wet for one week. No showers for one week; you may shower on 12/26/15    . - The tape/steri-strips on your wound will fall off; do not pull them off.  No bandage is needed on the site.  DO  NOT apply any creams, oils, or ointments to the wound area. - If you notice any drainage or discharge from the wound, any swelling or bruising at the site, or you develop a fever > 101? F after you are discharged home, call the office at once.  Special Instructions - You are still able to use cellular telephones; use the ear opposite the side where you have your pacemaker/defibrillator.  Avoid carrying your cellular phone near your device. - When traveling through airports, show security personnel your identification card to avoid being screened in the metal detectors.  Ask the security personnel to use the hand wand. - Avoid arc welding equipment, TENS units (transcutaneous nerve stimulators).  Call the office for questions about other devices. - Avoid electrical appliances that are in poor condition or are not properly grounded. - Microwave ovens are safe to be near or to operate.  Additional information for defibrillator patients should your device go off: - If your device goes off ONCE and you feel fine afterward, notify the device clinic nurses. - If your device goes off ONCE and you do not feel well afterward, call 911. - If your device goes off TWICE, call 911. - If your device  goes off THREE times in one day, call 911.  DO NOT DRIVE YOURSELF OR A FAMILY MEMBER WITH A DEFIBRILLATOR TO THE HOSPITAL--CALL 911.

## 2015-12-20 NOTE — Discharge Summary (Signed)
ELECTROPHYSIOLOGY PROCEDURE DISCHARGE SUMMARY    Patient ID: Anthony Lucas,  MRN: [id number], DOB/AGE: 06-23-1930 82 y.o.  Admit date: 12/19/2015 Discharge date: 12/20/2015  Primary Care Physician: Sallee Lange, MD Primary Cardiologist: Bronson Ing Electrophysiologist: Lovena Le  Primary Discharge Diagnosis:  Non ischemic cardiomyopathy, LBBB status post CRTPimplantation this admission  Secondary Discharge Diagnosis:  1.  Diabetes 2.  Hypertension 3.  Hyperlipidemia 4.  OSA  Allergies  Allergen Reactions  . Bee Venom Anaphylaxis    Honey bee      Procedures This Admission:  1.  Implantation of a MDT CRTP on 12/19/15 by Dr Lovena Le.  See op note for full details. There were no immediate post procedure complications. 2.  CXR on 12/20/15 demonstrated no pneumothorax status post device implantation.   Brief HPI: Anthony Lucas is a 82 y.o. male was referred to electrophysiology in the outpatient setting for consideration of CRTP implantation.  Past medical history includes non ischemic cardiomyopathy, congestive heart failure, and LBBB.  Risks, benefits, and alternatives to CRTP implantation were reviewed with the patient who wished to proceed.   Hospital Course:  The patient was admitted and underwent implantation of a MDT CRTP with details as outlined above.  He  was monitored on telemetry overnight which demonstrated sinus rhythm with ventricular pacing.  Left chest was without hematoma or ecchymosis.  The device was interrogated and found to be functioning normally.  CXR was obtained and demonstrated no pneumothorax status post device implantation.  Wound care, arm mobility, and restrictions were reviewed with the patient.  The patient was examined and considered stable for discharge to home.    Physical Exam: Vitals:   12/19/15 1500 12/19/15 1631 12/19/15 1918 12/20/15 0426  BP: 136/74 (!) 145/77 (!) 143/78 135/66  Pulse: 71 71 82 72  Resp: 17 18 (!) 22 18  Temp:  97.7 F  (36.5 C) 98.5 F (36.9 C) 97 F (36.1 C)  TempSrc:  Oral Oral Axillary  SpO2: 97% 96% 96% 97%  Weight:    224 lb 1.6 oz (101.7 kg)  Height:        GEN- The patient is elderly appearing, alert and oriented x 3 today.   HEENT: normocephalic, atraumatic; sclera clear, conjunctiva pink; hearing intact; oropharynx clear; neck supple  Lungs- Clear to ausculation bilaterally, normal work of breathing.  No wheezes, rales, rhonchi Heart- Regular rate and rhythm (paced) GI- soft, non-tender, non-distended, bowel sounds present  Extremities- no clubbing, cyanosis, or edema MS- no significant deformity or atrophy Skin- warm and dry, no rash or lesion, left chest without hematoma/ecchymosis Psych- euthymic mood, full affect Neuro- strength and sensation are intact   Labs:   Lab Results  Component Value Date   WBC 7.2 12/12/2015   HGB 14.3 12/12/2015   HCT 43.0 12/12/2015   MCV 82.9 12/12/2015   PLT 157 12/12/2015   No results for input(s): NA, K, CL, CO2, BUN, CREATININE, CALCIUM, PROT, BILITOT, ALKPHOS, ALT, AST, GLUCOSE in the last 168 hours.  Invalid input(s): LABALBU  Discharge Medications:    Medication List    TAKE these medications   allopurinol 100 MG tablet Commonly known as:  ZYLOPRIM Take 100 mg by mouth daily.   aspirin EC 81 MG tablet Take 81 mg by mouth daily. Reported on 08/29/2015   clobetasol cream 0.05 % Commonly known as:  TEMOVATE Apply 1 application topically 2 (two) times daily as needed.   ENTRESTO 24-26 MG Generic drug:  sacubitril-valsartan TAKE ONE TABLET  BY MOUTH 2 TIMES A DAY.   finasteride 5 MG tablet Commonly known as:  PROSCAR TAKE ONE TABLET BY MOUTH ONCE DAILY PROSTATE.   furosemide 20 MG tablet Commonly known as:  LASIX TAKE (1) TABLET BY MOUTH EACH MORNING.   metFORMIN 500 MG tablet Commonly known as:  GLUCOPHAGE TAKE ONE TABLET BY MOUTH DAILY.   metoprolol succinate 25 MG 24 hr tablet Commonly known as:  TOPROL XL Take 2  tablets (50 mg total) by mouth daily. What changed:  how much to take   mineral oil-hydrophilic petrolatum ointment Apply 1 application topically as needed for dry skin.   potassium chloride 10 MEQ tablet Commonly known as:  K-DUR TAKE 2 TABLETS BY MOUTH DAILY.   simvastatin 40 MG tablet Commonly known as:  ZOCOR TAKE 1 TABLET BY MOUTH ONCE DAILY.   Turmeric 500 MG Caps Take 1 capsule by mouth daily.       Disposition:  Discharge Instructions    Diet - low sodium heart healthy    Complete by:  As directed    Increase activity slowly    Complete by:  As directed      Follow-up Information    [facility] Follow up on 12/30/2015.   Specialty:  Cardiology Why:  3:00PM, wound check Contact information: [address], [address] [place]       Cristopher Peru, MD Follow up on 03/20/2016.   Specialty:  Cardiology Why:  at 8:15AM  Contact information: [place] [id number] [phone]           Duration of Discharge Encounter: Greater than 30 minutes including physician time.  Signed, Chanetta Marshall, NP 12/20/2015 7:34 AM  EP Attending  Patient seen and examined. Agree with above. He is stable for DC home. Usual followup. Device interogation and cxr have been reviewed. Normal function.   Mikle Bosworth.D.

## 2015-12-23 ENCOUNTER — Telehealth: Payer: Self-pay | Admitting: *Deleted

## 2015-12-23 NOTE — Telephone Encounter (Signed)
Patient's daughter called in stating that she believes pt is experiencing diaphragmatic stim. She said that she is able to feel pt's abdomen move when he is in certain positions. She said that the stimulation is not constant, but is making pt rather nervous. I assured her that this stimulation is not uncommon or dangerous for patients with biventricular devices and that some reprogramming could be made to stop the sensation. Daughter voiced understanding. Appt made for tomorrow 9/19 @ 1600.

## 2015-12-24 ENCOUNTER — Ambulatory Visit (INDEPENDENT_AMBULATORY_CARE_PROVIDER_SITE_OTHER): Payer: Medicare Other | Admitting: *Deleted

## 2015-12-24 DIAGNOSIS — Z950 Presence of cardiac pacemaker: Secondary | ICD-10-CM

## 2015-12-24 NOTE — Patient Instructions (Signed)
A chest X-ray has been ordered to [facility] for you.  Please have this done before your wound check appointment on Monday.  They already have the order (sent electronically).

## 2015-12-24 NOTE — Progress Notes (Signed)
Device check in clinic for diaphragmatic stim. Steri-strips remain in place until wound check appointment on 12/30/15. Normal device function. Thresholds, sensing, and impedances consistent with implant measurements. Unable to run Vector Express due to ectopy. Manual testing performed, stim present with all LV1 vectors, LV2-can, and LV2-LV4. Per GT, programmed LV2-LV3 (no stim <3.5V) with output at 2.75V @ 1.35ms. CXR ordered. Histogram distribution appropriate for patient and level of activity. 1 fast A&V episode, 1:1 SVT/ST, duration 17sec. 2 high ventricular rates noted, brief NSVT. Patient to obtain CXR at [facility] per GT order and return for wound check on 12/30/15.

## 2015-12-26 ENCOUNTER — Other Ambulatory Visit: Payer: Self-pay | Admitting: Internal Medicine

## 2015-12-26 ENCOUNTER — Ambulatory Visit (HOSPITAL_COMMUNITY): Admission: RE | Admit: 2015-12-26 | Payer: Medicare Other | Source: Ambulatory Visit

## 2015-12-26 ENCOUNTER — Ambulatory Visit (HOSPITAL_COMMUNITY)
Admission: RE | Admit: 2015-12-26 | Discharge: 2015-12-26 | Disposition: A | Discharge status: Home / Self Care | Payer: Medicare Other | Source: Ambulatory Visit | Attending: Internal Medicine | Admitting: Internal Medicine

## 2015-12-26 DIAGNOSIS — Z950 Presence of cardiac pacemaker: Secondary | ICD-10-CM | POA: Diagnosis not present

## 2015-12-26 DIAGNOSIS — T82118A Breakdown (mechanical) of other cardiac electronic device, initial encounter: Secondary | ICD-10-CM | POA: Diagnosis not present

## 2015-12-27 LAB — CUP PACEART INCLINIC DEVICE CHECK
Brady Statistic RA Percent Paced: 1.2 %
Date Time Interrogation Session: 20170922185545
Implantable Lead Implant Date: 20170914
Implantable Lead Location: 753859
Implantable Lead Model: 5076
Lead Channel Pacing Threshold Amplitude: 0.5 V
Lead Channel Pacing Threshold Pulse Width: 0.4 ms
Lead Channel Pacing Threshold Pulse Width: 1 ms
Lead Channel Setting Pacing Amplitude: 3.5 V
Lead Channel Setting Pacing Amplitude: 3.5 V
Lead Channel Setting Pacing Amplitude: 5 V
Lead Channel Setting Pacing Pulse Width: 1 ms
Lead Channel Setting Sensing Sensitivity: 2.8 mV
MDC IDC LEAD IMPLANT DT: 20170914
MDC IDC LEAD IMPLANT DT: 20170914
MDC IDC LEAD LOCATION: 753858
MDC IDC LEAD LOCATION: 753860
MDC IDC MSMT LEADCHNL LV PACING THRESHOLD AMPLITUDE: 1.75 V
MDC IDC MSMT LEADCHNL RA PACING THRESHOLD AMPLITUDE: 1 V
MDC IDC MSMT LEADCHNL RA PACING THRESHOLD PULSEWIDTH: 0.4 ms
MDC IDC MSMT LEADCHNL RA SENSING INTR AMPL: 4 mV
MDC IDC MSMT LEADCHNL RV SENSING INTR AMPL: 11.4 mV
MDC IDC SET LEADCHNL RV PACING PULSEWIDTH: 0.4 ms

## 2015-12-30 ENCOUNTER — Ambulatory Visit (INDEPENDENT_AMBULATORY_CARE_PROVIDER_SITE_OTHER): Payer: Medicare Other | Admitting: *Deleted

## 2015-12-30 DIAGNOSIS — Z950 Presence of cardiac pacemaker: Secondary | ICD-10-CM

## 2015-12-30 LAB — CUP PACEART INCLINIC DEVICE CHECK
Date Time Interrogation Session: 20170925163926
Implantable Lead Implant Date: 20170914
Implantable Lead Location: 753858
Implantable Lead Location: 753859
Implantable Lead Location: 753860
Implantable Lead Model: 4598
Implantable Lead Model: 5076
Lead Channel Pacing Threshold Amplitude: 0.75 V
Lead Channel Pacing Threshold Amplitude: 3 V
Lead Channel Pacing Threshold Pulse Width: 1 ms
Lead Channel Sensing Intrinsic Amplitude: 20 mV
MDC IDC LEAD IMPLANT DT: 20170914
MDC IDC LEAD IMPLANT DT: 20170914
MDC IDC MSMT BATTERY REMAINING LONGEVITY: 73 mo
MDC IDC MSMT LEADCHNL RA PACING THRESHOLD AMPLITUDE: 0.75 V
MDC IDC MSMT LEADCHNL RA PACING THRESHOLD PULSEWIDTH: 0.4 ms
MDC IDC MSMT LEADCHNL RA SENSING INTR AMPL: 2.9 mV
MDC IDC MSMT LEADCHNL RV PACING THRESHOLD PULSEWIDTH: 0.4 ms

## 2015-12-30 NOTE — Progress Notes (Signed)
CRT-P device check in clinic. Normal device function. Thresholds, sensing, impedance consistent with previous measurements. Histograms appropriate for patient and level of activity. 9 fast A&V episodes. 2 VT episodes, EGMs show 1:1. Patient bi-ventricularly pacing 90% of the time. Pt reports continued diaphragmatic stimulation. LV1-LV4, LV2-LV4, LV3 to Can and LV3 to LV1 present with stimulation. LV4 to Can programed at 3.5V presents with no stimulation in office.Estimated longevity 5.55yrs. ROV with GT in 6wks in RDS.

## 2016-01-06 DIAGNOSIS — Z23 Encounter for immunization: Secondary | ICD-10-CM | POA: Diagnosis not present

## 2016-01-07 DIAGNOSIS — B351 Tinea unguium: Secondary | ICD-10-CM | POA: Diagnosis not present

## 2016-01-07 DIAGNOSIS — E1142 Type 2 diabetes mellitus with diabetic polyneuropathy: Secondary | ICD-10-CM | POA: Diagnosis not present

## 2016-01-07 DIAGNOSIS — L851 Acquired keratosis [keratoderma] palmaris et plantaris: Secondary | ICD-10-CM | POA: Diagnosis not present

## 2016-02-08 ENCOUNTER — Other Ambulatory Visit: Payer: Self-pay | Admitting: Family Medicine

## 2016-02-17 ENCOUNTER — Encounter: Payer: Self-pay | Admitting: Internal Medicine

## 2016-02-17 ENCOUNTER — Ambulatory Visit (INDEPENDENT_AMBULATORY_CARE_PROVIDER_SITE_OTHER): Payer: Medicare Other | Admitting: Internal Medicine

## 2016-02-17 VITALS — BP 142/72 | HR 67 | Ht 73.0 in | Wt 225.0 lb

## 2016-02-17 DIAGNOSIS — I5022 Chronic systolic (congestive) heart failure: Secondary | ICD-10-CM | POA: Diagnosis not present

## 2016-02-17 DIAGNOSIS — I2510 Atherosclerotic heart disease of native coronary artery without angina pectoris: Secondary | ICD-10-CM | POA: Diagnosis not present

## 2016-02-17 DIAGNOSIS — Z950 Presence of cardiac pacemaker: Secondary | ICD-10-CM | POA: Diagnosis not present

## 2016-02-17 DIAGNOSIS — I447 Left bundle-branch block, unspecified: Secondary | ICD-10-CM | POA: Diagnosis not present

## 2016-02-17 LAB — CUP PACEART INCLINIC DEVICE CHECK
Battery Remaining Longevity: 67 mo
Brady Statistic AP VP Percent: 8.71 %
Brady Statistic AP VS Percent: 0.25 %
Brady Statistic AS VP Percent: 73.51 %
Brady Statistic AS VS Percent: 17.53 %
Brady Statistic RV Percent Paced: 7.75 %
Date Time Interrogation Session: 20171113115005
Implantable Lead Implant Date: 20170914
Implantable Lead Location: 753858
Implantable Lead Location: 753859
Lead Channel Impedance Value: 361 Ohm
Lead Channel Impedance Value: 418 Ohm
Lead Channel Impedance Value: 437 Ohm
Lead Channel Impedance Value: 532 Ohm
Lead Channel Impedance Value: 703 Ohm
Lead Channel Impedance Value: 817 Ohm
Lead Channel Pacing Threshold Amplitude: 0.75 V
Lead Channel Pacing Threshold Amplitude: 3.25 V
Lead Channel Pacing Threshold Pulse Width: 0.4 ms
Lead Channel Sensing Intrinsic Amplitude: 13.125 mV
Lead Channel Setting Pacing Amplitude: 2 V
Lead Channel Setting Pacing Amplitude: 2.5 V
Lead Channel Setting Pacing Pulse Width: 1 ms
MDC IDC LEAD IMPLANT DT: 20170914
MDC IDC LEAD IMPLANT DT: 20170914
MDC IDC LEAD LOCATION: 753860
MDC IDC MSMT BATTERY VOLTAGE: 3.13 V
MDC IDC MSMT LEADCHNL LV IMPEDANCE VALUE: 703 Ohm
MDC IDC MSMT LEADCHNL LV IMPEDANCE VALUE: 836 Ohm
MDC IDC MSMT LEADCHNL LV IMPEDANCE VALUE: 893 Ohm
MDC IDC MSMT LEADCHNL LV PACING THRESHOLD PULSEWIDTH: 1 ms
MDC IDC MSMT LEADCHNL RA IMPEDANCE VALUE: 323 Ohm
MDC IDC MSMT LEADCHNL RA SENSING INTR AMPL: 4.375 mV
MDC IDC MSMT LEADCHNL RV IMPEDANCE VALUE: 532 Ohm
MDC IDC MSMT LEADCHNL RV PACING THRESHOLD AMPLITUDE: 0.5 V
MDC IDC MSMT LEADCHNL RV PACING THRESHOLD PULSEWIDTH: 0.4 ms
MDC IDC PG IMPLANT DT: 20170914
MDC IDC SET LEADCHNL LV PACING AMPLITUDE: 4 V
MDC IDC SET LEADCHNL RV PACING PULSEWIDTH: 0.4 ms
MDC IDC SET LEADCHNL RV SENSING SENSITIVITY: 2.8 mV
MDC IDC STAT BRADY RA PERCENT PACED: 12.28 %

## 2016-02-17 NOTE — Progress Notes (Signed)
HPI Anthony Lucas returns today for ongoing followup, s/p BiV device implant. He is a pleasant 82 yo man with a h/o chronic systolic heart failure, class 2, LBBB, who has severe LV dysfunction. He is on maximal medical therapy. He has never had syncope. He underwent BiV PM insertion several weeks ago. He had diaphragmatic stimulation and a fairly high threshold and was reprogrammed 4-can. His stimulation resolved. His CXR demonstrated no movement of the lead. The patient has felt well. He does not have palpitations.  Allergies  Allergen Reactions  . Bee Venom Anaphylaxis    Honey bee      Current Outpatient Prescriptions  Medication Sig Dispense Refill  . allopurinol (ZYLOPRIM) 100 MG tablet TAKE 2 TABLETS BY MOUTH DAILY FOR GOUT. 60 tablet 0  . aspirin EC 81 MG tablet Take 81 mg by mouth daily. Reported on 08/29/2015    . clobetasol cream (TEMOVATE) [id number] % Apply 1 application topically 2 (two) times daily as needed.    Marland Kitchen ENTRESTO 24-26 MG TAKE ONE TABLET BY MOUTH 2 TIMES A DAY. 60 tablet 6  . finasteride (PROSCAR) 5 MG tablet TAKE ONE TABLET BY MOUTH ONCE DAILY PROSTATE. 30 tablet 5  . furosemide (LASIX) 20 MG tablet TAKE (1) TABLET BY MOUTH EACH MORNING. 30 tablet 5  . metFORMIN (GLUCOPHAGE) 500 MG tablet TAKE ONE TABLET BY MOUTH DAILY. 30 tablet 5  . metoprolol succinate (TOPROL XL) 25 MG 24 hr tablet Take 2 tablets (50 mg total) by mouth daily. 90 tablet 3  . mineral oil-hydrophilic petrolatum (AQUAPHOR) ointment Apply 1 application topically as needed for dry skin.    . potassium chloride (K-DUR) 10 MEQ tablet TAKE 2 TABLETS BY MOUTH DAILY. 60 tablet 5  . simvastatin (ZOCOR) 40 MG tablet TAKE 1 TABLET BY MOUTH ONCE DAILY. 30 tablet 5  . Turmeric 500 MG CAPS Take 1 capsule by mouth daily.     No current facility-administered medications for this visit.      Past Medical History:  Diagnosis Date  . Arthritis    "right knee" (12/19/2015)  . CHF (congestive heart failure) ([place])      Abnormal echo January 2017  . History of colon polyps   . History of gout   . Hyperlipidemia   . Hypertension   . Myocardial infarction    "they said I'd had a heart attack but didn't know it" (12/19/2015)  . Presence of permanent cardiac pacemaker   . Seasonal allergies   . Skin cancer    "arms/hands" (12/19/2015)  . Sleep apnea    supposed to wear CPAP, but doesn't (12/19/2015)  . Type II diabetes mellitus (HCC)     ROS:   All systems reviewed and negative except as noted in the HPI.   Past Surgical History:  Procedure Laterality Date  . CATARACT EXTRACTION W/PHACO Right 09/08/2012   Procedure: CATARACT EXTRACTION PHACO AND INTRAOCULAR LENS PLACEMENT (IOC);  Surgeon: Tonny Branch, MD;  Location: [facility];  Service: Ophthalmology;  Laterality: Right;  CDE: 21.28  . CATARACT EXTRACTION W/PHACO Left 12/08/2012   Procedure: CATARACT EXTRACTION PHACO AND INTRAOCULAR LENS PLACEMENT (IOC);  Surgeon: Tonny Branch, MD;  Location: [facility];  Service: Ophthalmology;  Laterality: Left;  CDE:25.72  . [place]  . COLONOSCOPY     4 procedures  . COLONOSCOPY  2012   Dr. Sharlett Iles: diverticulosis, lipoma in ascending colon.   . EP IMPLANTABLE DEVICE N/A 12/19/2015   Procedure: BiV Pacemaker Insertion  CRT-P;  Surgeon: Evans Lance, MD;  Location: [place] [facility];  Service: Cardiovascular;  Laterality: N/A;  . INSERT / REPLACE / REMOVE PACEMAKER  12/19/2015  . JOINT REPLACEMENT    . POLYPECTOMY    . TONSILLECTOMY    . TOTAL KNEE ARTHROPLASTY Left 2000s  . TRANSURETHRAL RESECTION OF PROSTATE       Family History  Problem Relation Age of Onset  . Lung cancer Father   . Colon cancer Neg Hx      Social History   Social History  . Marital status: Married    Spouse name: N/A  . Number of children: N/A  . Years of education: N/A   Occupational History  . Not on file.   Social History Main Topics  . Smoking status: Never Smoker  . Smokeless tobacco: Never Used  .  Alcohol use 9.0 oz/week    7 Standard drinks or equivalent, 8 Shots of liquor per week     Comment: 12/19/2015 bour[place] 4oz 4 days/wk  . Drug use: No  . Sexual activity: Not Currently    Birth control/ protection: None   Other Topics Concern  . Not on file   Social History Narrative  . No narrative on file     BP (!) 142/72   Pulse 67   Ht 6\' 1"  (1.854 m)   Wt 225 lb (102.1 kg)   SpO2 94%   BMI 29.69 kg/m   Physical Exam:  Well appearing 82 yo man, looks younger than his stated age, NAD HEENT: Unremarkable Neck:  6 cm JVD, no thyromegally Lymphatics:  No adenopathy Back:  No CVA tenderness Lungs:  Clear with no wheezes or rales, well healed device insertion. HEART:  Regular rate rhythm, no murmurs, no rubs, no clicks, split S2. Abd:  soft, positive bowel sounds, no organomegally, no rebound, no guarding Ext:  2 plus pulses, no edema, no cyanosis, no clubbing Skin:  No rashes no nodules Neuro:  CN II through XII intact, motor grossly intact   Assess/Plan: 1. Chronic systolic heart failure - his symptoms are class 2. He is on maximal medical therapy. He is s/p BiV PPM insertion. 2.  HTN heart disease- his blood pressure is elevated slightly. I will see him back in a couple of months and consider uptitration of his medications. 3. CAD - his has an inferior scar on stress testing. He does not have any anginal symptoms. At this point, I think it is reasonable to hold off on cardiac cath before any biv procedure. 4. BiV PPM - his medtronic device is working normally. He is pacing a reduced amount due to his ventricular ectopy. I might consider adding an AA drug like sotalol and stopping his metoprolol in the future.  Mikle Bosworth.D.

## 2016-02-17 NOTE — Patient Instructions (Signed)
Your physician wants you to follow-up in: 4 1/2 Months with Dr.Taylor You will receive a reminder letter in the mail two months in advance. If you don't receive a letter, please call our office to schedule the follow-up appointment.  Your physician recommends that you continue on your current medications as directed. Please refer to the Current Medication list given to you today.  If you need a refill on your cardiac medications before your next appointment, please call your pharmacy.  Thank you for choosing [facility]!

## 2016-03-05 ENCOUNTER — Other Ambulatory Visit: Payer: Self-pay | Admitting: Family Medicine

## 2016-03-11 ENCOUNTER — Other Ambulatory Visit: Payer: Self-pay | Admitting: Family Medicine

## 2016-03-17 DIAGNOSIS — E1142 Type 2 diabetes mellitus with diabetic polyneuropathy: Secondary | ICD-10-CM | POA: Diagnosis not present

## 2016-03-17 DIAGNOSIS — B351 Tinea unguium: Secondary | ICD-10-CM | POA: Diagnosis not present

## 2016-03-17 DIAGNOSIS — L851 Acquired keratosis [keratoderma] palmaris et plantaris: Secondary | ICD-10-CM | POA: Diagnosis not present

## 2016-03-20 ENCOUNTER — Encounter: Payer: Medicare Other | Admitting: Internal Medicine

## 2016-03-23 ENCOUNTER — Encounter: Payer: Medicare Other | Admitting: Internal Medicine

## 2016-03-24 ENCOUNTER — Ambulatory Visit (INDEPENDENT_AMBULATORY_CARE_PROVIDER_SITE_OTHER): Payer: Medicare Other | Admitting: Family Medicine

## 2016-03-24 ENCOUNTER — Other Ambulatory Visit: Payer: Self-pay | Admitting: Family Medicine

## 2016-03-24 VITALS — BP 132/86 | Ht 73.0 in | Wt 223.0 lb

## 2016-03-24 DIAGNOSIS — M1000 Idiopathic gout, unspecified site: Secondary | ICD-10-CM | POA: Diagnosis not present

## 2016-03-24 DIAGNOSIS — E1169 Type 2 diabetes mellitus with other specified complication: Secondary | ICD-10-CM

## 2016-03-24 DIAGNOSIS — I2510 Atherosclerotic heart disease of native coronary artery without angina pectoris: Secondary | ICD-10-CM | POA: Diagnosis not present

## 2016-03-24 DIAGNOSIS — E784 Other hyperlipidemia: Secondary | ICD-10-CM

## 2016-03-24 DIAGNOSIS — E7849 Other hyperlipidemia: Secondary | ICD-10-CM

## 2016-03-24 LAB — POCT GLYCOSYLATED HEMOGLOBIN (HGB A1C): HEMOGLOBIN A1C: 5.6

## 2016-03-24 NOTE — Progress Notes (Signed)
   Subjective:    Patient ID: Anthony Lucas, male    DOB: [birth year], 82 y.o.   MRN: [id number]  HPI   Patient in office today for follow up on diabetes and hypertension. Patient states he has been feeling "pretty good".  He states he does not have any concerns at this time. He states he is taking his medicine is not had any gout flareups. States his urination is going well. Denies any heart failure symptoms. Takes his cholesterol medicine denies any problem with that. Watches his diet. Takes his diabetic medicine not having any low sugar spells. Sees his podiatrist every 3 months to monitor his feet and wears his diabetic shoes. He does take care of his elderly wife who has some significant health issues which make it difficult on her and him   Review of Systems  Constitutional: Negative for activity change, appetite change and fatigue.  HENT: Negative for congestion.   Respiratory: Negative for cough.   Cardiovascular: Negative for chest pain.  Gastrointestinal: Negative for abdominal pain.  Endocrine: Negative for polydipsia and polyphagia.  Neurological: Negative for weakness.  Psychiatric/Behavioral: Negative for confusion.       Objective:   Physical Exam  Constitutional: He appears well-nourished. No distress.  Cardiovascular: Normal rate and normal heart sounds.   No murmur heard. Pulmonary/Chest: Effort normal and breath sounds normal. No respiratory distress.  Musculoskeletal: He exhibits no edema.  Lymphadenopathy:    He has no cervical adenopathy.  Neurological: He is alert.  Psychiatric: His behavior is normal.  Vitals reviewed.         Assessment & Plan:  The patient was seen today as part of a comprehensive visit for diabetes. The importance of keeping her A1c at or below 7 was discussed. Importance of regular physical activity was discussed. Proper monitoring of glucose levels with glucometer discussed. The importance of adherence to medication as well as a  controlled low starch/sugar diet was also discussed. Also discussion regarding the importance of diabetic foot checks including self check every day. Also yearly diabetic eye exams recommended. The importance of keeping blood pressure under control and keeping LDL below 70 was also discussed. Also the importance of avoiding smoking. Standard follow-up visit recommended. Finally failure to follow good diabetic measures including self effort and compliance with recommendations can certainly increase the risk of heart disease strokes kidney failure blindness loss of limb and early death was discussed with the patient.  The patient was seen today as part of an evaluation regarding hyperlipidemia. Recent lab work has been reviewed with the patient as well as the goals for good cholesterol care. In addition to this medications have been discussed the importance of compliance with diet and medications discussed as well. Patient has been informed of potential side effects of medications in the importance to notify us should any problems occur. Finally the patient is aware that poor control of cholesterol, noncompliance can dramatically increase her risk of heart attack strokes and premature death. The patient will keep regular office visits and the patient does agreed to periodic lab work. 25 minutes was spent with this patient I reviewed over his previous lab work he needs new lab work in addition to this reduced metformin down to just one per day. Encourage healthy diet regular physical activity. Patient is limited because of his age

## 2016-04-09 ENCOUNTER — Other Ambulatory Visit: Payer: Self-pay | Admitting: Family Medicine

## 2016-04-24 ENCOUNTER — Telehealth: Payer: Self-pay | Admitting: Internal Medicine

## 2016-04-24 NOTE — Telephone Encounter (Signed)
Approved via My Chart for entresto for 1 yr

## 2016-04-24 NOTE — Telephone Encounter (Signed)
Pt has been approved for [facility] for 1 year per [facility] will send approval letters in the mail

## 2016-05-13 ENCOUNTER — Other Ambulatory Visit: Payer: Self-pay | Admitting: Family Medicine

## 2016-05-13 DIAGNOSIS — Z961 Presence of intraocular lens: Secondary | ICD-10-CM | POA: Diagnosis not present

## 2016-05-13 DIAGNOSIS — H43813 Vitreous degeneration, bilateral: Secondary | ICD-10-CM | POA: Diagnosis not present

## 2016-05-14 ENCOUNTER — Other Ambulatory Visit (HOSPITAL_COMMUNITY): Payer: Self-pay | Admitting: Podiatry

## 2016-05-14 DIAGNOSIS — M79673 Pain in unspecified foot: Secondary | ICD-10-CM | POA: Diagnosis not present

## 2016-05-14 DIAGNOSIS — E1142 Type 2 diabetes mellitus with diabetic polyneuropathy: Secondary | ICD-10-CM | POA: Diagnosis not present

## 2016-05-14 DIAGNOSIS — I739 Peripheral vascular disease, unspecified: Secondary | ICD-10-CM

## 2016-05-14 DIAGNOSIS — S9030XA Contusion of unspecified foot, initial encounter: Secondary | ICD-10-CM | POA: Diagnosis not present

## 2016-05-19 ENCOUNTER — Ambulatory Visit (HOSPITAL_COMMUNITY)
Admission: RE | Admit: 2016-05-19 | Discharge: 2016-05-19 | Disposition: A | Discharge status: Home / Self Care | Payer: Medicare Other | Source: Ambulatory Visit | Attending: Podiatry | Admitting: Podiatry

## 2016-05-19 DIAGNOSIS — I739 Peripheral vascular disease, unspecified: Secondary | ICD-10-CM | POA: Diagnosis not present

## 2016-05-26 DIAGNOSIS — L851 Acquired keratosis [keratoderma] palmaris et plantaris: Secondary | ICD-10-CM | POA: Diagnosis not present

## 2016-05-26 DIAGNOSIS — E1142 Type 2 diabetes mellitus with diabetic polyneuropathy: Secondary | ICD-10-CM | POA: Diagnosis not present

## 2016-05-26 DIAGNOSIS — B351 Tinea unguium: Secondary | ICD-10-CM | POA: Diagnosis not present

## 2016-05-27 DIAGNOSIS — H35363 Drusen (degenerative) of macula, bilateral: Secondary | ICD-10-CM | POA: Diagnosis not present

## 2016-05-27 DIAGNOSIS — H26491 Other secondary cataract, right eye: Secondary | ICD-10-CM | POA: Diagnosis not present

## 2016-05-27 DIAGNOSIS — H26492 Other secondary cataract, left eye: Secondary | ICD-10-CM | POA: Diagnosis not present

## 2016-05-27 DIAGNOSIS — H353131 Nonexudative age-related macular degeneration, bilateral, early dry stage: Secondary | ICD-10-CM | POA: Diagnosis not present

## 2016-05-29 ENCOUNTER — Encounter: Payer: Self-pay | Admitting: *Deleted

## 2016-06-09 DIAGNOSIS — H26491 Other secondary cataract, right eye: Secondary | ICD-10-CM | POA: Diagnosis not present

## 2016-06-09 DIAGNOSIS — H26492 Other secondary cataract, left eye: Secondary | ICD-10-CM | POA: Diagnosis not present

## 2016-06-16 ENCOUNTER — Encounter (HOSPITAL_COMMUNITY)
Admission: RE | Disposition: A | Discharge status: Home / Self Care | Payer: Self-pay | Source: Ambulatory Visit | Attending: Ophthalmology

## 2016-06-16 ENCOUNTER — Ambulatory Visit (HOSPITAL_COMMUNITY)
Admission: RE | Admit: 2016-06-16 | Discharge: 2016-06-16 | Disposition: A | Discharge status: Home / Self Care | Payer: Medicare Other | Source: Ambulatory Visit | Attending: Ophthalmology | Admitting: Ophthalmology

## 2016-06-16 ENCOUNTER — Encounter (HOSPITAL_COMMUNITY): Payer: Self-pay

## 2016-06-16 DIAGNOSIS — H2640 Unspecified secondary cataract: Secondary | ICD-10-CM | POA: Insufficient documentation

## 2016-06-16 DIAGNOSIS — Z794 Long term (current) use of insulin: Secondary | ICD-10-CM | POA: Insufficient documentation

## 2016-06-16 DIAGNOSIS — Z79899 Other long term (current) drug therapy: Secondary | ICD-10-CM | POA: Diagnosis not present

## 2016-06-16 DIAGNOSIS — H26491 Other secondary cataract, right eye: Secondary | ICD-10-CM | POA: Diagnosis not present

## 2016-06-16 HISTORY — PX: YAG LASER APPLICATION: SHX6189

## 2016-06-16 SURGERY — TREATMENT, USING YAG LASER
Anesthesia: LOCAL | Laterality: Right

## 2016-06-16 MED ORDER — TROPICAMIDE 1 % OP SOLN
OPHTHALMIC | Status: AC
  Filled 2016-06-16: qty 3

## 2016-06-16 MED ORDER — TROPICAMIDE 1 % OP SOLN
1.0000 [drp] | OPHTHALMIC | Status: AC
  Administered 2016-06-16 (×3): 1 [drp] via OPHTHALMIC

## 2016-06-16 MED ORDER — CYCLOPENTOLATE-PHENYLEPHRINE 0.2-1 % OP SOLN: 1.0000 [drp] | OPHTHALMIC | Status: DC

## 2016-06-16 NOTE — Op Note (Signed)
Jabori Henegar T. Gershon Crane, MD  Procedure: Yag Capsulotomy  Yag Laser Self Test Completedyes. Procedure: Posterior Capsulotomy, Eye Protection Worn by Staff yes. Laser In Use Sign on Door yes.  Laser: Nd:YAG Spot Size: Fixed Burst Mode: III Power Setting: 3.4 mJ/burst Number of shots: 18 Total energy delivered: 58.22 mJ   The patient tolerated the procedure without difficulty. No complications were encountered.   The patient was discharged home with the instructions to continue all her current glaucoma medications, if any.   Patient instructed to go to office at 0100 for intraocular pressure check.  Patient verbalizes understanding of discharge instructions Yes.  .    Pre-Operative Diagnosis: After-Cataract, obscuring vision, 366.53 OD Post-Operative Diagnosis: After-Cataract, obscuring vision, 366.53 OD Date of Cataract Surgery: Unknown

## 2016-06-16 NOTE — H&P (Signed)
The patient was re examined and there is no change in the patients condition since the original H and P. 

## 2016-06-16 NOTE — Discharge Instructions (Signed)
[name]  06/16/2016     Instructions    Activity: No Restrictions.   Diet: Resume Diet you were on at home.   Pain Medication: Tylenol if Needed.   CONTACT YOUR DOCTOR IF YOU HAVE PAIN, REDNESS IN YOUR EYE, OR DECREASED VISION.   Follow-up:today with [name], MD.   Dr. Gershon Crane: [phone]  Dr. Iona Hansen: [phone]  Dr. Geoffry Paradise: [phone]   If you find that you cannot contact your physician, but feel that your signs and   Symptoms warrant a physician's attention, call the Emergency Room at   [phone] [phone].   Othern/a.  FOLLOW UP TODAY WITH DR Gershon Crane AT 1:00 PM

## 2016-06-18 ENCOUNTER — Encounter (HOSPITAL_COMMUNITY): Payer: Self-pay | Admitting: Ophthalmology

## 2016-06-30 ENCOUNTER — Ambulatory Visit (HOSPITAL_COMMUNITY)
Admission: RE | Admit: 2016-06-30 | Discharge: 2016-06-30 | Disposition: A | Discharge status: Home / Self Care | Payer: Medicare Other | Source: Ambulatory Visit | Attending: Ophthalmology | Admitting: Ophthalmology

## 2016-06-30 ENCOUNTER — Encounter (HOSPITAL_COMMUNITY)
Admission: RE | Disposition: A | Discharge status: Home / Self Care | Payer: Self-pay | Source: Ambulatory Visit | Attending: Ophthalmology

## 2016-06-30 DIAGNOSIS — H2640 Unspecified secondary cataract: Secondary | ICD-10-CM | POA: Diagnosis not present

## 2016-06-30 DIAGNOSIS — H26492 Other secondary cataract, left eye: Secondary | ICD-10-CM | POA: Diagnosis not present

## 2016-06-30 HISTORY — PX: YAG LASER APPLICATION: SHX6189

## 2016-06-30 SURGERY — TREATMENT, USING YAG LASER
Anesthesia: LOCAL | Laterality: Left

## 2016-06-30 MED ORDER — CYCLOPENTOLATE-PHENYLEPHRINE 0.2-1 % OP SOLN: 1.0000 [drp] | OPHTHALMIC | Status: DC

## 2016-06-30 MED ORDER — TROPICAMIDE 1 % OP SOLN
OPHTHALMIC | Status: AC
  Filled 2016-06-30: qty 15

## 2016-06-30 MED ORDER — TROPICAMIDE 1 % OP SOLN
1.0000 [drp] | OPHTHALMIC | Status: AC
  Administered 2016-06-30 (×3): 1 [drp] via OPHTHALMIC

## 2016-06-30 NOTE — Op Note (Signed)
Oluwatamilore Starnes T. Gershon Crane, MD  Procedure: Yag Capsulotomy  Yag Laser Self Test Completedyes. Procedure: Posterior Capsulotomy, Eye Protection Worn by Staff yes. Laser In Use Sign on Door yes.  Laser: Nd:YAG Spot Size: Fixed Burst Mode: III Power Setting: 36 mJ/burst Number of shots: 3.4 Total energy delivered: 113.08 mJ   The patient tolerated the procedure without difficulty. No complications were encountered.   The patient was discharged home with the instructions to continue all her current glaucoma medications, if any.   Patient instructed to go to office at 0100 for intraocular pressure check.  Patient verbalizes understanding of discharge instructions Yes.  .   Pre-Operative Diagnosis: After-Cataract, obscuring vision, 366.53 OS Post-Operative Diagnosis: After-Cataract, obscuring vision, 366.53 OS Date of Cataract Surgery: Unknown

## 2016-06-30 NOTE — Discharge Instructions (Signed)
Anthony Lucas  06/30/2016     Instructions    Activity: No Restrictions.   Diet: Resume Diet you were on at home.   Pain Medication: Tylenol if Needed.   CONTACT YOUR DOCTOR IF YOU HAVE PAIN, REDNESS IN YOUR EYE, OR DECREASED VISION.   Follow-up:today with [name], MD.   Dr. Gershon Crane: [phone]  Dr. Iona Hansen: [phone]  Dr. Geoffry Paradise: [phone]   If you find that you cannot contact your physician, but feel that your signs and   Symptoms warrant a physician's attention, call the Emergency Room at   [phone] ext.[phone].   Othern/a.

## 2016-06-30 NOTE — H&P (Signed)
The patient was re examined and there is no change in the patients condition since the original H and P. 

## 2016-07-01 ENCOUNTER — Encounter (HOSPITAL_COMMUNITY): Payer: Self-pay | Admitting: Ophthalmology

## 2016-07-07 ENCOUNTER — Other Ambulatory Visit: Payer: Self-pay | Admitting: Family Medicine

## 2016-07-15 ENCOUNTER — Encounter: Payer: Self-pay | Admitting: Internal Medicine

## 2016-07-15 ENCOUNTER — Ambulatory Visit (INDEPENDENT_AMBULATORY_CARE_PROVIDER_SITE_OTHER): Payer: Medicare Other | Admitting: Internal Medicine

## 2016-07-15 ENCOUNTER — Encounter: Payer: Medicare Other | Admitting: Internal Medicine

## 2016-07-15 VITALS — BP 124/60 | HR 83 | Ht 73.0 in | Wt 214.0 lb

## 2016-07-15 DIAGNOSIS — I5022 Chronic systolic (congestive) heart failure: Secondary | ICD-10-CM

## 2016-07-15 DIAGNOSIS — Z950 Presence of cardiac pacemaker: Secondary | ICD-10-CM

## 2016-07-15 DIAGNOSIS — I519 Heart disease, unspecified: Secondary | ICD-10-CM | POA: Diagnosis not present

## 2016-07-15 LAB — CUP PACEART INCLINIC DEVICE CHECK
Battery Remaining Longevity: 65 mo
Brady Statistic AP VS Percent: 0.28 %
Brady Statistic AS VP Percent: 70.1 %
Brady Statistic AS VS Percent: 20.18 %
Brady Statistic RV Percent Paced: 14.58 %
Date Time Interrogation Session: 20180411190431
Implantable Lead Implant Date: 20170914
Implantable Lead Location: 753858
Implantable Lead Location: 753859
Implantable Lead Location: 753860
Implantable Lead Model: 4598
Implantable Lead Model: 5076
Implantable Pulse Generator Implant Date: 20170914
Lead Channel Impedance Value: 323 Ohm
Lead Channel Impedance Value: 380 Ohm
Lead Channel Impedance Value: 475 Ohm
Lead Channel Impedance Value: 532 Ohm
Lead Channel Impedance Value: 798 Ohm
Lead Channel Impedance Value: 817 Ohm
Lead Channel Pacing Threshold Amplitude: 0.5 V
Lead Channel Pacing Threshold Amplitude: 0.75 V
Lead Channel Pacing Threshold Pulse Width: 0.4 ms
Lead Channel Pacing Threshold Pulse Width: 0.4 ms
Lead Channel Pacing Threshold Pulse Width: 1 ms
Lead Channel Sensing Intrinsic Amplitude: 12.25 mV
Lead Channel Sensing Intrinsic Amplitude: 3.25 mV
Lead Channel Sensing Intrinsic Amplitude: 4.25 mV
Lead Channel Setting Pacing Amplitude: 2 V
Lead Channel Setting Pacing Pulse Width: 0.4 ms
Lead Channel Setting Sensing Sensitivity: 2.8 mV
MDC IDC LEAD IMPLANT DT: 20170914
MDC IDC LEAD IMPLANT DT: 20170914
MDC IDC MSMT BATTERY VOLTAGE: 3.01 V
MDC IDC MSMT LEADCHNL LV IMPEDANCE VALUE: 684 Ohm
MDC IDC MSMT LEADCHNL LV IMPEDANCE VALUE: 703 Ohm
MDC IDC MSMT LEADCHNL LV IMPEDANCE VALUE: 817 Ohm
MDC IDC MSMT LEADCHNL LV PACING THRESHOLD AMPLITUDE: 3.75 V
MDC IDC MSMT LEADCHNL RV IMPEDANCE VALUE: 456 Ohm
MDC IDC MSMT LEADCHNL RV IMPEDANCE VALUE: 513 Ohm
MDC IDC MSMT LEADCHNL RV SENSING INTR AMPL: 17.875 mV
MDC IDC SET LEADCHNL LV PACING AMPLITUDE: 4 V
MDC IDC SET LEADCHNL LV PACING PULSEWIDTH: 1 ms
MDC IDC SET LEADCHNL RV PACING AMPLITUDE: 2.5 V
MDC IDC STAT BRADY AP VP PERCENT: 9.44 %
MDC IDC STAT BRADY RA PERCENT PACED: 13.77 %

## 2016-07-15 NOTE — Progress Notes (Signed)
HPI [name] returns today for ongoing followup, s/p BiV device implant. He is a pleasant 83 yo man with a h/o chronic systolic heart failure, class 2, LBBB, who has severe LV dysfunction. He is on maximal medical therapy. He has never had syncope. He underwent BiV PM insertion several months ago. He had diaphragmatic stimulation and a fairly high threshold and was reprogrammed 4-can. His stimulation resolved. His CXR demonstrated no movement of the lead. The patient has felt well. He does not have palpitations. No new problems. Allergies  Allergen Reactions  . Bee Venom Anaphylaxis    Honey bee      Current Outpatient Prescriptions  Medication Sig Dispense Refill  . allopurinol (ZYLOPRIM) 100 MG tablet TAKE 2 TABLETS BY MOUTH DAILY FOR GOUT. 60 tablet 2  . aspirin EC 81 MG tablet Take 81 mg by mouth daily. Reported on 08/29/2015    . clobetasol cream (TEMOVATE) 7.67 % Apply 1 application topically 2 (two) times daily as needed.    Marland Kitchen ENTRESTO 24-26 MG TAKE ONE TABLET BY MOUTH 2 TIMES A DAY. 60 tablet 6  . finasteride (PROSCAR) 5 MG tablet TAKE ONE TABLET BY MOUTH ONCE DAILY PROSTATE. 30 tablet 5  . furosemide (LASIX) 20 MG tablet TAKE (1) TABLET BY MOUTH EACH MORNING FOR FLUID. 30 tablet 5  . metFORMIN (GLUCOPHAGE) 500 MG tablet Take 0.5 tablets (250 mg total) by mouth daily with breakfast. 30 tablet 5  . metoprolol succinate (TOPROL XL) 25 MG 24 hr tablet Take 2 tablets (50 mg total) by mouth daily. 90 tablet 3  . mineral oil-hydrophilic petrolatum (AQUAPHOR) ointment Apply 1 application topically as needed for dry skin.    . potassium chloride (K-DUR) 10 MEQ tablet TAKE 2 TABLETS BY MOUTH DAILY FOR POTASSIUM REPLACEMENT. 60 tablet 5  . simvastatin (ZOCOR) 40 MG tablet TAKE 1 TABLET BY MOUTH ONCE DAILY FOR CHOLESTEROL. 30 tablet 0  . Turmeric 500 MG CAPS Take 1 capsule by mouth daily.     No current facility-administered medications for this visit.      Past Medical History:    Diagnosis Date  . Arthritis    "right knee" (12/19/2015)  . CHF (congestive heart failure) ([place])    Abnormal echo January 2017  . History of colon polyps   . History of gout   . Hyperlipidemia   . Hypertension   . Myocardial infarction    "they said I'd had a heart attack but didn't know it" (12/19/2015)  . Presence of permanent cardiac pacemaker   . Seasonal allergies   . Skin cancer    "arms/hands" (12/19/2015)  . Sleep apnea    supposed to wear CPAP, but doesn't (12/19/2015)  . Type II diabetes mellitus (HCC)     ROS:   All systems reviewed and negative except as noted in the HPI.   Past Surgical History:  Procedure Laterality Date  . CATARACT EXTRACTION W/PHACO Right 09/08/2012   Procedure: CATARACT EXTRACTION PHACO AND INTRAOCULAR LENS PLACEMENT (IOC);  Surgeon: Tonny Branch, MD;  Location: [facility];  Service: Ophthalmology;  Laterality: Right;  CDE: 21.28  . CATARACT EXTRACTION W/PHACO Left 12/08/2012   Procedure: CATARACT EXTRACTION PHACO AND INTRAOCULAR LENS PLACEMENT (IOC);  Surgeon: Tonny Branch, MD;  Location: [facility];  Service: Ophthalmology;  Laterality: Left;  CDE:25.72  . [place]  . COLONOSCOPY     4 procedures  . COLONOSCOPY  2012   Dr. Sharlett Iles: diverticulosis, lipoma in ascending colon.   Marland Kitchen  EP IMPLANTABLE DEVICE N/A 12/19/2015   Procedure: BiV Pacemaker Insertion CRT-P;  Surgeon: Evans Lance, MD;  Location: [facility];  Service: Cardiovascular;  Laterality: N/A;  . INSERT / REPLACE / REMOVE PACEMAKER  12/19/2015  . JOINT REPLACEMENT    . POLYPECTOMY    . TONSILLECTOMY    . TOTAL KNEE ARTHROPLASTY Left 2000s  . TRANSURETHRAL RESECTION OF PROSTATE    . YAG LASER APPLICATION Right 8/41/3244   Procedure: YAG LASER APPLICATION;  Surgeon: [name], MD;  Location: [facility];  Service: Ophthalmology;  Laterality: Right;  . YAG LASER APPLICATION Left 0/01/2724   Procedure: YAG LASER APPLICATION;  Surgeon: [name], MD;  Location: [facility];   Service: Ophthalmology;  Laterality: Left;     Family History  Problem Relation Age of Onset  . Lung cancer Father   . Colon cancer Neg Hx      Social History   Social History  . Marital status: Married    Spouse name: N/A  . Number of children: N/A  . Years of education: N/A   Occupational History  . Not on file.   Social History Main Topics  . Smoking status: Never Smoker  . Smokeless tobacco: Never Used  . Alcohol use 9.0 oz/week    7 Standard drinks or equivalent, 8 Shots of liquor per week     Comment: 12/19/2015 [place] 4oz 4 days/wk  . Drug use: No  . Sexual activity: Not Currently    Birth control/ protection: None   Other Topics Concern  . Not on file   Social History Narrative  . No narrative on file     BP 124/60   Pulse 83   Ht 6\' 1"  (1.854 m)   Wt 214 lb (97.1 kg)   SpO2 98%   BMI 28.23 kg/m   Physical Exam:  Well appearing 83 yo man, looks younger than his stated age, NAD HEENT: Unremarkable Neck:  6 cm JVD, no thyromegally Lymphatics:  No adenopathy Back:  No CVA tenderness Lungs:  Clear with no wheezes or rales, well healed device insertion. HEART:  Regular rate rhythm, no murmurs, no rubs, no clicks, split S2. Abd:  soft, positive bowel sounds, no organomegally, no rebound, no guarding Ext:  2 plus pulses, no edema, no cyanosis, no clubbing Skin:  No rashes no nodules Neuro:  CN II through XII intact, motor grossly intact   Assess/Plan: 1. Chronic systolic heart failure - his symptoms are class 2. He is on maximal medical therapy. He is s/p BiV PPM insertion. 2.  HTN heart disease- his blood pressure is better today. He will continue his current meds. 3. CAD - his has an inferior scar on stress testing. He does not have any anginal symptoms.  4. BiV PPM - his medtronic device is working normally although his threshold is increased and his lead has not moved. He is pacing a reduced amount due to his ventricular ectopy.  Mikle Bosworth.D.

## 2016-07-15 NOTE — Patient Instructions (Addendum)
Your physician recommends that you continue on your current medications as directed. Please refer to the Current Medication list given to you today.   Your physician wants you to follow-up in: [place]  10-14-16  You will receive a reminder letter in the mail two months in advance. If you don't receive a letter, please call our office to schedule the follow-up appointment.

## 2016-08-06 ENCOUNTER — Other Ambulatory Visit: Payer: Self-pay | Admitting: Family Medicine

## 2016-08-11 ENCOUNTER — Other Ambulatory Visit: Payer: Self-pay | Admitting: Internal Medicine

## 2016-09-02 ENCOUNTER — Other Ambulatory Visit: Payer: Self-pay | Admitting: Family Medicine

## 2016-09-08 DIAGNOSIS — X32XXXD Exposure to sunlight, subsequent encounter: Secondary | ICD-10-CM | POA: Diagnosis not present

## 2016-09-08 DIAGNOSIS — Z08 Encounter for follow-up examination after completed treatment for malignant neoplasm: Secondary | ICD-10-CM | POA: Diagnosis not present

## 2016-09-08 DIAGNOSIS — D225 Melanocytic nevi of trunk: Secondary | ICD-10-CM | POA: Diagnosis not present

## 2016-09-08 DIAGNOSIS — L570 Actinic keratosis: Secondary | ICD-10-CM | POA: Diagnosis not present

## 2016-09-08 DIAGNOSIS — Z85828 Personal history of other malignant neoplasm of skin: Secondary | ICD-10-CM | POA: Diagnosis not present

## 2016-09-09 ENCOUNTER — Other Ambulatory Visit: Payer: Self-pay | Admitting: Internal Medicine

## 2016-09-22 ENCOUNTER — Encounter: Payer: Self-pay | Admitting: Family Medicine

## 2016-09-22 ENCOUNTER — Ambulatory Visit (INDEPENDENT_AMBULATORY_CARE_PROVIDER_SITE_OTHER): Payer: Medicare Other | Admitting: Family Medicine

## 2016-09-22 ENCOUNTER — Other Ambulatory Visit: Payer: Self-pay | Admitting: Family Medicine

## 2016-09-22 VITALS — BP 132/82 | Ht 73.0 in | Wt 207.2 lb

## 2016-09-22 DIAGNOSIS — E7849 Other hyperlipidemia: Secondary | ICD-10-CM

## 2016-09-22 DIAGNOSIS — I5022 Chronic systolic (congestive) heart failure: Secondary | ICD-10-CM

## 2016-09-22 DIAGNOSIS — E784 Other hyperlipidemia: Secondary | ICD-10-CM | POA: Diagnosis not present

## 2016-09-22 DIAGNOSIS — M1000 Idiopathic gout, unspecified site: Secondary | ICD-10-CM

## 2016-09-22 DIAGNOSIS — E119 Type 2 diabetes mellitus without complications: Secondary | ICD-10-CM | POA: Diagnosis not present

## 2016-09-22 DIAGNOSIS — I10 Essential (primary) hypertension: Secondary | ICD-10-CM | POA: Diagnosis not present

## 2016-09-22 DIAGNOSIS — Z79899 Other long term (current) drug therapy: Secondary | ICD-10-CM

## 2016-09-22 DIAGNOSIS — E1169 Type 2 diabetes mellitus with other specified complication: Secondary | ICD-10-CM

## 2016-09-22 LAB — POCT GLYCOSYLATED HEMOGLOBIN (HGB A1C): HEMOGLOBIN A1C: 6

## 2016-09-22 NOTE — Progress Notes (Addendum)
   Subjective:    Patient ID: Anthony Lucas, male    DOB: 1930/08/19, 83 y.o.   MRN: [id number]  Diabetes  He presents for his follow-up diabetic visit. He has type 2 diabetes mellitus. Pertinent negatives for hypoglycemia include no headaches. Pertinent negatives for diabetes include no chest pain and no fatigue. Risk factors for coronary artery disease include diabetes mellitus, dyslipidemia and hypertension. Current diabetic treatment includes oral agent (monotherapy). He is compliant with treatment all of the time. His weight is stable. He is following a diabetic diet.  Patient denies any gout flareups States his heart is weak sees cardiology periodically Takes his cholesterol medicine watch his diet States energy level is low does a little bit of activity than he sits arrest Denies being depressed denies any falls    Review of Systems  Constitutional: Negative for activity change, fatigue and fever.  Respiratory: Negative for cough and shortness of breath.   Cardiovascular: Negative for chest pain and leg swelling.  Neurological: Negative for headaches.       Objective:   Physical Exam  Constitutional: He appears well-nourished. No distress.  Cardiovascular: Normal rate, regular rhythm and normal heart sounds.   No murmur heard. Pulmonary/Chest: Effort normal and breath sounds normal. No respiratory distress.  Musculoskeletal: He exhibits no edema.  Lymphadenopathy:    He has no cervical adenopathy.  Neurological: He is alert.  Psychiatric: His behavior is normal.  Vitals reviewed.  Diabetic foot exam was completed today patient does have mild neuropathy of the feet along with some changes in calluses pre-ulcerative  25 minutes was spent with the patient. Greater than half the time was spent in discussion and answering questions and counseling regarding the issues that the patient came in for today.      Assessment & Plan:  Diabetes good control continue current  medicines watch diet closely check lab work  History of gout continue medication check lab work  Blood pressure issues overall doing well continue current measures  CHF stable continue current measures follow-up with cardiology a yearly basis follow-up with [place] in December  Hyperlipidemia previous labs reviewed continue current measures follow-up in fall  Up-to-date on immunizations follow-up in fall November or December

## 2016-09-28 DIAGNOSIS — M1000 Idiopathic gout, unspecified site: Secondary | ICD-10-CM | POA: Diagnosis not present

## 2016-09-28 DIAGNOSIS — E119 Type 2 diabetes mellitus without complications: Secondary | ICD-10-CM | POA: Diagnosis not present

## 2016-09-28 DIAGNOSIS — Z79899 Other long term (current) drug therapy: Secondary | ICD-10-CM | POA: Diagnosis not present

## 2016-09-28 DIAGNOSIS — E784 Other hyperlipidemia: Secondary | ICD-10-CM | POA: Diagnosis not present

## 2016-09-29 ENCOUNTER — Other Ambulatory Visit: Payer: Self-pay | Admitting: Family Medicine

## 2016-09-29 DIAGNOSIS — B351 Tinea unguium: Secondary | ICD-10-CM | POA: Diagnosis not present

## 2016-09-29 DIAGNOSIS — E1142 Type 2 diabetes mellitus with diabetic polyneuropathy: Secondary | ICD-10-CM | POA: Diagnosis not present

## 2016-09-29 LAB — BASIC METABOLIC PANEL
BUN / CREAT RATIO: 12 (ref 10–24)
BUN: 13 mg/dL (ref 8–27)
CHLORIDE: 105 mmol/L (ref 96–106)
CO2: 22 mmol/L (ref 20–29)
Calcium: 9 mg/dL (ref 8.6–10.2)
Creatinine, Ser: 1.08 mg/dL (ref 0.76–1.27)
GFR calc non Af Amer: 63 mL/min/{1.73_m2} (ref >59)
GFR, EST AFRICAN AMERICAN: 73 mL/min/{1.73_m2} (ref >59)
GLUCOSE: 111 mg/dL — AB (ref 65–99)
Potassium: 4.4 mmol/L (ref 3.5–5.2)
SODIUM: 143 mmol/L (ref 134–144)

## 2016-09-29 LAB — LIPID PANEL
CHOLESTEROL TOTAL: 122 mg/dL (ref 100–199)
Chol/HDL Ratio: 3.1 ratio (ref 0.0–5.0)
HDL: 40 mg/dL (ref >39)
LDL Calculated: 56 mg/dL (ref 0–99)
Triglycerides: 130 mg/dL (ref 0–149)
VLDL CHOLESTEROL CAL: 26 mg/dL (ref 5–40)

## 2016-09-29 LAB — MICROALBUMIN / CREATININE URINE RATIO
CREATININE, UR: 166.6 mg/dL
Microalb/Creat Ratio: 9.2 mg/g creat (ref 0.0–30.0)
Microalbumin, Urine: 15.3 ug/mL

## 2016-09-29 LAB — HEPATIC FUNCTION PANEL
ALK PHOS: 51 IU/L (ref 39–117)
ALT: 15 IU/L (ref 0–44)
AST: 17 IU/L (ref 0–40)
Albumin: 3.9 g/dL (ref 3.5–4.7)
BILIRUBIN, DIRECT: 0.21 mg/dL (ref 0.00–0.40)
Bilirubin Total: 0.7 mg/dL (ref 0.0–1.2)
TOTAL PROTEIN: 6.5 g/dL (ref 6.0–8.5)

## 2016-09-29 LAB — URIC ACID: URIC ACID: 5.7 mg/dL (ref 3.7–8.6)

## 2016-09-30 ENCOUNTER — Encounter: Payer: Self-pay | Admitting: Family Medicine

## 2016-10-06 ENCOUNTER — Other Ambulatory Visit: Payer: Self-pay | Admitting: *Deleted

## 2016-10-06 ENCOUNTER — Other Ambulatory Visit: Payer: Self-pay | Admitting: Family Medicine

## 2016-10-06 MED ORDER — SACUBITRIL-VALSARTAN 24-26 MG PO TABS: ORAL_TABLET | ORAL | 1 refills | Status: DC

## 2016-10-14 ENCOUNTER — Encounter: Payer: Medicare Other | Admitting: *Deleted

## 2016-10-14 ENCOUNTER — Telehealth: Payer: Self-pay | Admitting: Cardiology

## 2016-10-14 NOTE — Telephone Encounter (Signed)
Confirmed remote transmission w/ pt wife caregiver.

## 2016-10-15 ENCOUNTER — Ambulatory Visit (INDEPENDENT_AMBULATORY_CARE_PROVIDER_SITE_OTHER): Payer: Medicare Other | Admitting: Cardiovascular Disease

## 2016-10-15 ENCOUNTER — Encounter: Payer: Self-pay | Admitting: Cardiovascular Disease

## 2016-10-15 VITALS — BP 120/68 | HR 59 | Ht 73.0 in | Wt 207.0 lb

## 2016-10-15 DIAGNOSIS — E784 Other hyperlipidemia: Secondary | ICD-10-CM | POA: Diagnosis not present

## 2016-10-15 DIAGNOSIS — I519 Heart disease, unspecified: Secondary | ICD-10-CM

## 2016-10-15 DIAGNOSIS — I2510 Atherosclerotic heart disease of native coronary artery without angina pectoris: Secondary | ICD-10-CM | POA: Diagnosis not present

## 2016-10-15 DIAGNOSIS — I447 Left bundle-branch block, unspecified: Secondary | ICD-10-CM

## 2016-10-15 DIAGNOSIS — Z950 Presence of cardiac pacemaker: Secondary | ICD-10-CM | POA: Diagnosis not present

## 2016-10-15 DIAGNOSIS — I5022 Chronic systolic (congestive) heart failure: Secondary | ICD-10-CM | POA: Diagnosis not present

## 2016-10-15 DIAGNOSIS — E7849 Other hyperlipidemia: Secondary | ICD-10-CM

## 2016-10-15 NOTE — Patient Instructions (Signed)

## 2016-10-15 NOTE — Progress Notes (Signed)
SUBJECTIVE: The patient presents for follow-up of coronary artery disease, chronic systolic heart failure, severe left ventricular dysfunction, and biventricular pacemaker.  I reviewed the most recent interrogation and there is normal device function.  The patient denies any symptoms of chest pain, palpitations, lightheadedness, dizziness, leg swelling, orthopnea, PND, and syncope.  He has not been hospitalized this year.   Review of Systems: As per "subjective", otherwise negative.  Allergies  Allergen Reactions  . Bee Venom Anaphylaxis    Honey bee     Current Outpatient Prescriptions  Medication Sig Dispense Refill  . allopurinol (ZYLOPRIM) 100 MG tablet TAKE 2 TABLETS BY MOUTH DAILY FOR GOUT. 60 tablet 5  . aspirin EC 81 MG tablet Take 81 mg by mouth daily. Reported on 08/29/2015    . clobetasol cream (TEMOVATE) 4.23 % Apply 1 application topically 2 (two) times daily as needed.    . finasteride (PROSCAR) 5 MG tablet TAKE ONE TABLET BY MOUTH ONCE DAILY PROSTATE. 30 tablet 5  . furosemide (LASIX) 20 MG tablet TAKE (1) TABLET BY MOUTH EACH MORNING FOR FLUID. 30 tablet 5  . metFORMIN (GLUCOPHAGE) 500 MG tablet TAKE ONE TABLET BY MOUTH DAILY FOR DIABETES. 30 tablet 5  . metoprolol succinate (TOPROL XL) 25 MG 24 hr tablet Take 2 tablets (50 mg total) by mouth daily. 90 tablet 3  . mineral oil-hydrophilic petrolatum (AQUAPHOR) ointment Apply 1 application topically as needed for dry skin.    . potassium chloride (K-DUR) 10 MEQ tablet TAKE 2 TABLETS BY MOUTH DAILY FOR POTASSIUM REPLACEMENT. 60 tablet 5  . sacubitril-valsartan (ENTRESTO) 24-26 MG TAKE ONE TABLET BY MOUTH 2 TIMES A DAY. 60 tablet 1  . simvastatin (ZOCOR) 40 MG tablet TAKE 1 TABLET BY MOUTH ONCE DAILY FOR CHOLESTEROL. 30 tablet 2   No current facility-administered medications for this visit.     Past Medical History:  Diagnosis Date  . Arthritis    "right knee" (12/19/2015)  . CHF (congestive heart failure)  ([place])    Abnormal echo January 2017  . History of colon polyps   . History of gout   . Hyperlipidemia   . Hypertension   . Myocardial infarction [facility])    "they said I'd had a heart attack but didn't know it" (12/19/2015)  . Presence of permanent cardiac pacemaker   . Seasonal allergies   . Skin cancer    "arms/hands" (12/19/2015)  . Sleep apnea    supposed to wear CPAP, but doesn't (12/19/2015)  . Type II diabetes mellitus ([place])     Past Surgical History:  Procedure Laterality Date  . CATARACT EXTRACTION W/PHACO Right 09/08/2012   Procedure: CATARACT EXTRACTION PHACO AND INTRAOCULAR LENS PLACEMENT (IOC);  Surgeon: Tonny Branch, MD;  Location: [facility];  Service: Ophthalmology;  Laterality: Right;  CDE: 21.28  . CATARACT EXTRACTION W/PHACO Left 12/08/2012   Procedure: CATARACT EXTRACTION PHACO AND INTRAOCULAR LENS PLACEMENT (IOC);  Surgeon: Tonny Branch, MD;  Location: [facility];  Service: Ophthalmology;  Laterality: Left;  CDE:25.72  . [place]  . COLONOSCOPY     4 procedures  . COLONOSCOPY  2012   Dr. Sharlett Iles: diverticulosis, lipoma in ascending colon.   . EP IMPLANTABLE DEVICE N/A 12/19/2015   Procedure: BiV Pacemaker Insertion CRT-P;  Surgeon: Evans Lance, MD;  Location: [facility];  Service: Cardiovascular;  Laterality: N/A;  . INSERT / REPLACE / REMOVE PACEMAKER  12/19/2015  . JOINT REPLACEMENT    . POLYPECTOMY    .  TONSILLECTOMY    . TOTAL KNEE ARTHROPLASTY Left 2000s  . TRANSURETHRAL RESECTION OF PROSTATE    . YAG LASER APPLICATION Right 0/76/2263   Procedure: YAG LASER APPLICATION;  Surgeon: [name], MD;  Location: [facility];  Service: Ophthalmology;  Laterality: Right;  . YAG LASER APPLICATION Left 3/35/4562   Procedure: YAG LASER APPLICATION;  Surgeon: [name], MD;  Location: [facility];  Service: Ophthalmology;  Laterality: Left;    Social History   Social History  . Marital status: Married    Spouse name: N/A  . Number of children: N/A  .  Years of education: N/A   Occupational History  . Not on file.   Social History Main Topics  . Smoking status: Never Smoker  . Smokeless tobacco: Never Used  . Alcohol use 9.0 oz/week    7 Standard drinks or equivalent, 8 Shots of liquor per week     Comment: 12/19/2015 [place] 4oz 4 days/wk  . Drug use: No  . Sexual activity: Not Currently    Birth control/ protection: None   Other Topics Concern  . Not on file   Social History Narrative  . No narrative on file     Vitals:   10/15/16 1309  BP: 120/68  Pulse: (!) 59  SpO2: 98%  Weight: 207 lb (93.9 kg)  Height: 6\' 1"  (1.854 m)    Wt Readings from Last 3 Encounters:  10/15/16 207 lb (93.9 kg)  09/22/16 207 lb 3.2 oz (94 kg)  07/15/16 214 lb (97.1 kg)     PHYSICAL EXAM General: NAD HEENT: Normal. Neck: No JVD, no thyromegaly. Lungs: Clear to auscultation bilaterally with normal respiratory effort. CV: Nondisplaced PMI.  Regular rate and rhythm, normal S1/S2, no S3/S4, no murmur. No pretibial or periankle edema.  No carotid bruit.   Abdomen: Soft, nontender, no distention.  Neurologic: Alert and oriented.  Psych: Normal affect. Skin: Normal. Musculoskeletal: No gross deformities.    ECG: Most recent ECG reviewed.   Labs: Lab Results  Component Value Date/Time   K 4.4 09/28/2016 09:15 AM   BUN 13 09/28/2016 09:15 AM   CREATININE 1.08 09/28/2016 09:15 AM   CREATININE 1.17 (H) 12/12/2015 10:48 AM   ALT 15 09/28/2016 09:15 AM   TSH 1.960 04/26/2015 09:42 AM   HGB 14.3 12/12/2015 10:48 AM   HGB 13.5 09/11/2015 03:07 PM     Lipids: Lab Results  Component Value Date/Time   LDLCALC 56 09/28/2016 09:15 AM   CHOL 122 09/28/2016 09:15 AM   TRIG 130 09/28/2016 09:15 AM   HDL 40 09/28/2016 09:15 AM       ASSESSMENT AND PLAN: 1. Chronic systolic heart failure with severe left ventricular systolic dysfunction and biventricular pacemaker: Stable NYHA class II symptoms. No changes to therapy. Continue  Lasix, Toprol-XL, and Entresto.  2. Hypertension: Controlled on present therapy. No changes.  3. Hyperlipidemia: Continue simvastatin 40 mg.  4. Coronary artery disease: Symptomatically stable. Continue aspirin, beta blocker, and statin.      Disposition: Follow up 6 months.   Kate Sable, M.D., F.A.C.C.

## 2016-10-19 ENCOUNTER — Ambulatory Visit (INDEPENDENT_AMBULATORY_CARE_PROVIDER_SITE_OTHER): Payer: Medicare Other | Admitting: *Deleted

## 2016-10-19 DIAGNOSIS — I5022 Chronic systolic (congestive) heart failure: Secondary | ICD-10-CM

## 2016-10-19 DIAGNOSIS — I429 Cardiomyopathy, unspecified: Secondary | ICD-10-CM

## 2016-10-20 LAB — CUP PACEART REMOTE DEVICE CHECK
Battery Remaining Longevity: 61 mo
Battery Voltage: 2.99 V
Brady Statistic AP VP Percent: 9.29 %
Brady Statistic AP VS Percent: 0.26 %
Brady Statistic RA Percent Paced: 13.19 %
Brady Statistic RV Percent Paced: 16.13 %
Implantable Lead Implant Date: 20170914
Implantable Lead Location: 753858
Implantable Lead Location: 753860
Implantable Lead Model: 4598
Implantable Lead Model: 5076
Implantable Lead Model: 5076
Implantable Pulse Generator Implant Date: 20170914
Lead Channel Impedance Value: 304 Ohm
Lead Channel Impedance Value: 361 Ohm
Lead Channel Impedance Value: 437 Ohm
Lead Channel Impedance Value: 456 Ohm
Lead Channel Impedance Value: 494 Ohm
Lead Channel Impedance Value: 494 Ohm
Lead Channel Impedance Value: 741 Ohm
Lead Channel Impedance Value: 760 Ohm
Lead Channel Impedance Value: 779 Ohm
Lead Channel Pacing Threshold Pulse Width: 0.4 ms
Lead Channel Sensing Intrinsic Amplitude: 11.75 mV
Lead Channel Sensing Intrinsic Amplitude: 11.75 mV
Lead Channel Sensing Intrinsic Amplitude: 2.625 mV
Lead Channel Sensing Intrinsic Amplitude: 2.625 mV
Lead Channel Setting Pacing Amplitude: 4 V
Lead Channel Setting Pacing Pulse Width: 0.4 ms
Lead Channel Setting Pacing Pulse Width: 1 ms
MDC IDC LEAD IMPLANT DT: 20170914
MDC IDC LEAD IMPLANT DT: 20170914
MDC IDC LEAD LOCATION: 753859
MDC IDC MSMT LEADCHNL LV IMPEDANCE VALUE: 399 Ohm
MDC IDC MSMT LEADCHNL LV IMPEDANCE VALUE: 418 Ohm
MDC IDC MSMT LEADCHNL LV IMPEDANCE VALUE: 646 Ohm
MDC IDC MSMT LEADCHNL LV IMPEDANCE VALUE: 646 Ohm
MDC IDC MSMT LEADCHNL LV PACING THRESHOLD AMPLITUDE: 3.25 V
MDC IDC MSMT LEADCHNL LV PACING THRESHOLD PULSEWIDTH: 1 ms
MDC IDC MSMT LEADCHNL RA IMPEDANCE VALUE: 513 Ohm
MDC IDC MSMT LEADCHNL RA PACING THRESHOLD AMPLITUDE: 0.75 V
MDC IDC MSMT LEADCHNL RV PACING THRESHOLD AMPLITUDE: 0.5 V
MDC IDC MSMT LEADCHNL RV PACING THRESHOLD PULSEWIDTH: 0.4 ms
MDC IDC SESS DTM: 20180716171914
MDC IDC SET LEADCHNL RA PACING AMPLITUDE: 2 V
MDC IDC SET LEADCHNL RV PACING AMPLITUDE: 2.5 V
MDC IDC SET LEADCHNL RV SENSING SENSITIVITY: 2.8 mV
MDC IDC STAT BRADY AS VP PERCENT: 69.38 %
MDC IDC STAT BRADY AS VS PERCENT: 21.07 %

## 2016-10-20 NOTE — Progress Notes (Signed)
Remote pacemaker transmission.   

## 2016-10-21 ENCOUNTER — Encounter: Payer: Self-pay | Admitting: Cardiology

## 2016-11-06 ENCOUNTER — Other Ambulatory Visit: Payer: Self-pay | Admitting: Family Medicine

## 2016-12-08 ENCOUNTER — Other Ambulatory Visit: Payer: Self-pay | Admitting: Family Medicine

## 2016-12-08 DIAGNOSIS — E1142 Type 2 diabetes mellitus with diabetic polyneuropathy: Secondary | ICD-10-CM | POA: Diagnosis not present

## 2016-12-08 DIAGNOSIS — B351 Tinea unguium: Secondary | ICD-10-CM | POA: Diagnosis not present

## 2016-12-14 ENCOUNTER — Other Ambulatory Visit: Payer: Self-pay | Admitting: *Deleted

## 2016-12-14 MED ORDER — SACUBITRIL-VALSARTAN 24-26 MG PO TABS: ORAL_TABLET | ORAL | 6 refills | Status: DC

## 2016-12-30 ENCOUNTER — Other Ambulatory Visit: Payer: Self-pay | Admitting: Nurse Practitioner

## 2017-01-15 DIAGNOSIS — Z23 Encounter for immunization: Secondary | ICD-10-CM | POA: Diagnosis not present

## 2017-01-18 ENCOUNTER — Ambulatory Visit (INDEPENDENT_AMBULATORY_CARE_PROVIDER_SITE_OTHER): Payer: Medicare Other | Admitting: *Deleted

## 2017-01-18 DIAGNOSIS — I429 Cardiomyopathy, unspecified: Secondary | ICD-10-CM

## 2017-01-18 DIAGNOSIS — I5022 Chronic systolic (congestive) heart failure: Secondary | ICD-10-CM

## 2017-01-18 NOTE — Progress Notes (Signed)
Remote pacemaker transmission.   

## 2017-01-20 LAB — CUP PACEART REMOTE DEVICE CHECK
Brady Statistic AP VP Percent: 8.47 %
Brady Statistic AS VP Percent: 79.48 %
Brady Statistic RA Percent Paced: 11.38 %
Brady Statistic RV Percent Paced: 15.42 %
Date Time Interrogation Session: 20181015041426
Implantable Lead Implant Date: 20170914
Implantable Lead Implant Date: 20170914
Implantable Lead Location: 753858
Implantable Lead Location: 753859
Implantable Lead Location: 753860
Implantable Lead Model: 5076
Implantable Pulse Generator Implant Date: 20170914
Lead Channel Impedance Value: 456 Ohm
Lead Channel Impedance Value: 475 Ohm
Lead Channel Impedance Value: 494 Ohm
Lead Channel Impedance Value: 494 Ohm
Lead Channel Impedance Value: 513 Ohm
Lead Channel Impedance Value: 684 Ohm
Lead Channel Impedance Value: 779 Ohm
Lead Channel Impedance Value: 817 Ohm
Lead Channel Pacing Threshold Amplitude: 0.5 V
Lead Channel Pacing Threshold Pulse Width: 0.4 ms
Lead Channel Sensing Intrinsic Amplitude: 1.75 mV
Lead Channel Sensing Intrinsic Amplitude: 1.75 mV
Lead Channel Sensing Intrinsic Amplitude: 10.375 mV
Lead Channel Setting Pacing Amplitude: 2.5 V
Lead Channel Setting Pacing Pulse Width: 1 ms
MDC IDC LEAD IMPLANT DT: 20170914
MDC IDC MSMT BATTERY REMAINING LONGEVITY: 59 mo
MDC IDC MSMT BATTERY VOLTAGE: 2.97 V
MDC IDC MSMT LEADCHNL LV IMPEDANCE VALUE: 380 Ohm
MDC IDC MSMT LEADCHNL LV IMPEDANCE VALUE: 418 Ohm
MDC IDC MSMT LEADCHNL LV IMPEDANCE VALUE: 437 Ohm
MDC IDC MSMT LEADCHNL LV IMPEDANCE VALUE: 684 Ohm
MDC IDC MSMT LEADCHNL LV IMPEDANCE VALUE: 779 Ohm
MDC IDC MSMT LEADCHNL LV PACING THRESHOLD AMPLITUDE: 4 V
MDC IDC MSMT LEADCHNL LV PACING THRESHOLD PULSEWIDTH: 1 ms
MDC IDC MSMT LEADCHNL RA IMPEDANCE VALUE: 323 Ohm
MDC IDC MSMT LEADCHNL RA PACING THRESHOLD AMPLITUDE: 0.75 V
MDC IDC MSMT LEADCHNL RA PACING THRESHOLD PULSEWIDTH: 0.4 ms
MDC IDC MSMT LEADCHNL RV SENSING INTR AMPL: 10.375 mV
MDC IDC SET LEADCHNL LV PACING AMPLITUDE: 4 V
MDC IDC SET LEADCHNL RA PACING AMPLITUDE: 2 V
MDC IDC SET LEADCHNL RV PACING PULSEWIDTH: 0.4 ms
MDC IDC SET LEADCHNL RV SENSING SENSITIVITY: 2.8 mV
MDC IDC STAT BRADY AP VS PERCENT: 0.22 %
MDC IDC STAT BRADY AS VS PERCENT: 11.84 %

## 2017-01-22 ENCOUNTER — Encounter: Payer: Self-pay | Admitting: Cardiology

## 2017-02-02 DIAGNOSIS — L570 Actinic keratosis: Secondary | ICD-10-CM | POA: Diagnosis not present

## 2017-02-02 DIAGNOSIS — L0212 Furuncle of neck: Secondary | ICD-10-CM | POA: Diagnosis not present

## 2017-02-02 DIAGNOSIS — B9689 Other specified bacterial agents as the cause of diseases classified elsewhere: Secondary | ICD-10-CM | POA: Diagnosis not present

## 2017-02-02 DIAGNOSIS — C44622 Squamous cell carcinoma of skin of right upper limb, including shoulder: Secondary | ICD-10-CM | POA: Diagnosis not present

## 2017-02-02 DIAGNOSIS — X32XXXD Exposure to sunlight, subsequent encounter: Secondary | ICD-10-CM | POA: Diagnosis not present

## 2017-02-03 ENCOUNTER — Other Ambulatory Visit: Payer: Self-pay | Admitting: Family Medicine

## 2017-02-16 DIAGNOSIS — B351 Tinea unguium: Secondary | ICD-10-CM | POA: Diagnosis not present

## 2017-02-16 DIAGNOSIS — E1142 Type 2 diabetes mellitus with diabetic polyneuropathy: Secondary | ICD-10-CM | POA: Diagnosis not present

## 2017-02-19 ENCOUNTER — Other Ambulatory Visit: Payer: Self-pay | Admitting: Family Medicine

## 2017-03-03 ENCOUNTER — Other Ambulatory Visit: Payer: Self-pay | Admitting: Family Medicine

## 2017-04-07 ENCOUNTER — Other Ambulatory Visit: Payer: Self-pay | Admitting: Family Medicine

## 2017-04-19 ENCOUNTER — Ambulatory Visit (INDEPENDENT_AMBULATORY_CARE_PROVIDER_SITE_OTHER): Payer: Medicare Other | Admitting: *Deleted

## 2017-04-19 DIAGNOSIS — I5022 Chronic systolic (congestive) heart failure: Secondary | ICD-10-CM

## 2017-04-19 DIAGNOSIS — I429 Cardiomyopathy, unspecified: Secondary | ICD-10-CM

## 2017-04-21 ENCOUNTER — Other Ambulatory Visit: Payer: Self-pay | Admitting: Family Medicine

## 2017-04-21 LAB — CUP PACEART REMOTE DEVICE CHECK
Battery Voltage: 2.97 V
Brady Statistic AP VP Percent: 5.52 %
Brady Statistic AS VS Percent: 9.91 %
Brady Statistic RA Percent Paced: 7.38 %
Date Time Interrogation Session: 20190114042911
Implantable Lead Location: 753859
Implantable Lead Model: 4598
Implantable Pulse Generator Implant Date: 20170914
Lead Channel Impedance Value: 380 Ohm
Lead Channel Impedance Value: 437 Ohm
Lead Channel Impedance Value: 437 Ohm
Lead Channel Impedance Value: 494 Ohm
Lead Channel Impedance Value: 494 Ohm
Lead Channel Impedance Value: 760 Ohm
Lead Channel Impedance Value: 779 Ohm
Lead Channel Pacing Threshold Amplitude: 0.875 V
Lead Channel Pacing Threshold Amplitude: 3.75 V
Lead Channel Pacing Threshold Pulse Width: 0.4 ms
Lead Channel Pacing Threshold Pulse Width: 1 ms
Lead Channel Sensing Intrinsic Amplitude: 2.75 mV
Lead Channel Sensing Intrinsic Amplitude: 2.75 mV
Lead Channel Setting Pacing Amplitude: 2 V
Lead Channel Setting Pacing Amplitude: 2.5 V
Lead Channel Setting Pacing Pulse Width: 0.4 ms
Lead Channel Setting Sensing Sensitivity: 2.8 mV
MDC IDC LEAD IMPLANT DT: 20170914
MDC IDC LEAD IMPLANT DT: 20170914
MDC IDC LEAD IMPLANT DT: 20170914
MDC IDC LEAD LOCATION: 753858
MDC IDC LEAD LOCATION: 753860
MDC IDC MSMT BATTERY REMAINING LONGEVITY: 56 mo
MDC IDC MSMT LEADCHNL LV IMPEDANCE VALUE: 475 Ohm
MDC IDC MSMT LEADCHNL LV IMPEDANCE VALUE: 684 Ohm
MDC IDC MSMT LEADCHNL LV IMPEDANCE VALUE: 703 Ohm
MDC IDC MSMT LEADCHNL LV IMPEDANCE VALUE: 798 Ohm
MDC IDC MSMT LEADCHNL RA IMPEDANCE VALUE: 323 Ohm
MDC IDC MSMT LEADCHNL RA IMPEDANCE VALUE: 513 Ohm
MDC IDC MSMT LEADCHNL RV IMPEDANCE VALUE: 437 Ohm
MDC IDC MSMT LEADCHNL RV PACING THRESHOLD AMPLITUDE: 0.5 V
MDC IDC MSMT LEADCHNL RV PACING THRESHOLD PULSEWIDTH: 0.4 ms
MDC IDC MSMT LEADCHNL RV SENSING INTR AMPL: 10.875 mV
MDC IDC MSMT LEADCHNL RV SENSING INTR AMPL: 10.875 mV
MDC IDC SET LEADCHNL LV PACING AMPLITUDE: 4 V
MDC IDC SET LEADCHNL LV PACING PULSEWIDTH: 1 ms
MDC IDC STAT BRADY AP VS PERCENT: 0.14 %
MDC IDC STAT BRADY AS VP PERCENT: 84.43 %
MDC IDC STAT BRADY RV PERCENT PACED: 9.75 %

## 2017-04-21 NOTE — Progress Notes (Signed)
Remote pacemaker transmission.   

## 2017-04-23 ENCOUNTER — Encounter: Payer: Self-pay | Admitting: Cardiology

## 2017-04-27 ENCOUNTER — Ambulatory Visit (INDEPENDENT_AMBULATORY_CARE_PROVIDER_SITE_OTHER): Payer: Medicare Other | Admitting: Cardiovascular Disease

## 2017-04-27 ENCOUNTER — Encounter: Payer: Self-pay | Admitting: Cardiovascular Disease

## 2017-04-27 VITALS — BP 134/74 | HR 77 | Ht 73.5 in | Wt 205.0 lb

## 2017-04-27 DIAGNOSIS — I429 Cardiomyopathy, unspecified: Secondary | ICD-10-CM

## 2017-04-27 DIAGNOSIS — I5022 Chronic systolic (congestive) heart failure: Secondary | ICD-10-CM | POA: Diagnosis not present

## 2017-04-27 DIAGNOSIS — I25118 Atherosclerotic heart disease of native coronary artery with other forms of angina pectoris: Secondary | ICD-10-CM | POA: Diagnosis not present

## 2017-04-27 DIAGNOSIS — I209 Angina pectoris, unspecified: Secondary | ICD-10-CM | POA: Diagnosis not present

## 2017-04-27 DIAGNOSIS — E7849 Other hyperlipidemia: Secondary | ICD-10-CM

## 2017-04-27 DIAGNOSIS — I519 Heart disease, unspecified: Secondary | ICD-10-CM

## 2017-04-27 DIAGNOSIS — Z950 Presence of cardiac pacemaker: Secondary | ICD-10-CM | POA: Diagnosis not present

## 2017-04-27 NOTE — Patient Instructions (Signed)
Your physician recommends that you schedule a follow-up appointment in: September with [place]    Your physician recommends that you continue on your current medications as directed. Please refer to the Current Medication list given to you today.    If you need a refill on your cardiac medications before your next appointment, please call your pharmacy.     No lab work or tests ordered today.        Thank you for choosing [facility] !

## 2017-04-27 NOTE — Progress Notes (Signed)
SUBJECTIVE: The patient presents for follow-up of coronary artery disease, chronic systolic heart failure, severe left ventricular dysfunction, and biventricular pacemaker.  I reviewed most recent device interrogation on 04/18/17.  There was one 6 beat run of NSVT.  Optival parameters were returning to baseline.  The patient denies any symptoms of chest pain, palpitations, worsening shortness of breath, lightheadedness, dizziness, leg swelling, orthopnea, PND, and syncope.  ECG performed today which I personally interpreted demonstrates appropriate pacing with ventricular ectopy.  His wife is on dialysis and she has required more medical attention recently.   Review of Systems: As per "subjective", otherwise negative.  Allergies  Allergen Reactions  . Bee Venom Anaphylaxis    Honey bee     Current Outpatient Medications  Medication Sig Dispense Refill  . allopurinol (ZYLOPRIM) 100 MG tablet TAKE 2 TABLETS BY MOUTH DAILY FOR GOUT. 60 tablet 5  . aspirin EC 81 MG tablet Take 81 mg by mouth daily. Reported on 08/29/2015    . clobetasol cream (TEMOVATE) 5.80 % Apply 1 application topically 2 (two) times daily as needed.    . finasteride (PROSCAR) 5 MG tablet TAKE ONE TABLET BY MOUTH ONCE DAILY PROSTATE. 30 tablet 0  . furosemide (LASIX) 20 MG tablet TAKE (1) TABLET BY MOUTH EACH MORNING FOR FLUID. 30 tablet 0  . metFORMIN (GLUCOPHAGE) 500 MG tablet TAKE ONE TABLET BY MOUTH DAILY FOR DIABETES. 30 tablet 0  . metoprolol succinate (TOPROL-XL) 25 MG 24 hr tablet Take 2 tablets (50 mg total) by mouth daily. 180 tablet 1  . mineral oil-hydrophilic petrolatum (AQUAPHOR) ointment Apply 1 application topically as needed for dry skin.    . potassium chloride (K-DUR) 10 MEQ tablet TAKE 2 TABLETS BY MOUTH DAILY FOR POTASSIUM REPLACEMENT. 60 tablet 5  . potassium chloride (K-DUR) 10 MEQ tablet TAKE 2 TABLETS BY MOUTH DAILY FOR POTASSIUM REPLACEMENT. 60 tablet 1  . sacubitril-valsartan  (ENTRESTO) 24-26 MG TAKE ONE TABLET BY MOUTH 2 TIMES A DAY. 60 tablet 6  . simvastatin (ZOCOR) 40 MG tablet TAKE 1 TABLET BY MOUTH ONCE DAILY FOR CHOLESTEROL. 30 tablet 0   No current facility-administered medications for this visit.     Past Medical History:  Diagnosis Date  . Arthritis    "right knee" (12/19/2015)  . CHF (congestive heart failure) ([place])    Abnormal echo January 2017  . History of colon polyps   . History of gout   . Hyperlipidemia   . Hypertension   . Myocardial infarction [facility])    "they said I'd had a heart attack but didn't know it" (12/19/2015)  . Presence of permanent cardiac pacemaker   . Seasonal allergies   . Skin cancer    "arms/hands" (12/19/2015)  . Sleep apnea    supposed to wear CPAP, but doesn't (12/19/2015)  . Type II diabetes mellitus ([place])     Past Surgical History:  Procedure Laterality Date  . CATARACT EXTRACTION W/PHACO Right 09/08/2012   Procedure: CATARACT EXTRACTION PHACO AND INTRAOCULAR LENS PLACEMENT (IOC);  Surgeon: Tonny Branch, MD;  Location: [facility];  Service: Ophthalmology;  Laterality: Right;  CDE: 21.28  . CATARACT EXTRACTION W/PHACO Left 12/08/2012   Procedure: CATARACT EXTRACTION PHACO AND INTRAOCULAR LENS PLACEMENT (IOC);  Surgeon: Tonny Branch, MD;  Location: [facility];  Service: Ophthalmology;  Laterality: Left;  CDE:25.72  . [place]  . COLONOSCOPY     4 procedures  . COLONOSCOPY  2012   Dr. Sharlett Iles: diverticulosis, lipoma in  ascending colon.   . EP IMPLANTABLE DEVICE N/A 12/19/2015   Procedure: BiV Pacemaker Insertion CRT-P;  Surgeon: Evans Lance, MD;  Location: [place] [facility];  Service: Cardiovascular;  Laterality: N/A;  . INSERT / REPLACE / REMOVE PACEMAKER  12/19/2015  . JOINT REPLACEMENT    . POLYPECTOMY    . TONSILLECTOMY    . TOTAL KNEE ARTHROPLASTY Left 2000s  . TRANSURETHRAL RESECTION OF PROSTATE    . YAG LASER APPLICATION Right 1/61/0960   Procedure: YAG LASER APPLICATION;  Surgeon: [name], MD;  Location: [facility];  Service: Ophthalmology;  Laterality: Right;  . YAG LASER APPLICATION Left 4/54/0981   Procedure: YAG LASER APPLICATION;  Surgeon: [name], MD;  Location: [facility];  Service: Ophthalmology;  Laterality: Left;    Social History   Socioeconomic History  . Marital status: Married    Spouse name: Not on file  . Number of children: Not on file  . Years of education: Not on file  . Highest education level: Not on file  Social Needs  . Financial resource strain: Not on file  . Food insecurity - worry: Not on file  . Food insecurity - inability: Not on file  . Transportation needs - medical: Not on file  . Transportation needs - non-medical: Not on file  Occupational History  . Not on file  Tobacco Use  . Smoking status: Never Smoker  . Smokeless tobacco: Never Used  Substance and Sexual Activity  . Alcohol use: Yes    Alcohol/week: 9.0 oz    Types: 7 Standard drinks or equivalent, 8 Shots of liquor per week    Comment: 12/19/2015 [place] 4oz 4 days/wk  . Drug use: No  . Sexual activity: Not Currently    Birth control/protection: None  Other Topics Concern  . Not on file  Social History Narrative  . Not on file     Vitals:   04/27/17 1115  BP: 134/74  Pulse: 77  SpO2: 97%  Weight: 205 lb (93 kg)  Height: 6' 1.5" (1.867 m)    Wt Readings from Last 3 Encounters:  04/27/17 205 lb (93 kg)  10/15/16 207 lb (93.9 kg)  09/22/16 207 lb 3.2 oz (94 kg)     PHYSICAL EXAM General: NAD HEENT: Normal. Neck: No JVD, no thyromegaly. Lungs: Clear to auscultation bilaterally with normal respiratory effort. CV: Regular rate and rhythm, normal S1/S2, no S3/S4, no murmur. No pretibial or periankle edema.  No carotid bruit.   Abdomen: Soft, nontender, no distention.  Neurologic: Alert and oriented.  Psych: Normal affect. Skin: Normal. Musculoskeletal: No gross deformities.    ECG: Most recent ECG reviewed.   Labs: Lab Results  Component  Value Date/Time   K 4.4 09/28/2016 09:15 AM   BUN 13 09/28/2016 09:15 AM   CREATININE 1.08 09/28/2016 09:15 AM   CREATININE 1.17 (H) 12/12/2015 10:48 AM   ALT 15 09/28/2016 09:15 AM   TSH 1.960 04/26/2015 09:42 AM   HGB 14.3 12/12/2015 10:48 AM   HGB 13.5 09/11/2015 03:07 PM     Lipids: Lab Results  Component Value Date/Time   LDLCALC 56 09/28/2016 09:15 AM   CHOL 122 09/28/2016 09:15 AM   TRIG 130 09/28/2016 09:15 AM   HDL 40 09/28/2016 09:15 AM       ASSESSMENT AND PLAN: 1. Chronic systolic heart failure with severe left ventricular systolic dysfunction and biventricular pacemaker: Stable NYHA class II symptoms. No changes to therapy. Continue Lasix, Toprol-XL, and Entresto.  2. Hypertension: Controlled on present therapy. No changes.  3. Hyperlipidemia: Continue simvastatin 40 mg.  4. Coronary artery disease: Symptomatically stable.  Previous nuclear stress test demonstrated a moderate sized area of myocardial scar in the inferior and inferoseptal walls suggestive of prior myocardial infarction.  There was no ischemia.  He denies anginal symptoms.  Continue aspirin (he takes it intermittently due to hematuria), beta blocker, and statin.     Disposition: Follow up September 2019   Kate Sable, M.D., F.A.C.C.

## 2017-04-29 ENCOUNTER — Encounter: Payer: Self-pay | Admitting: Internal Medicine

## 2017-04-29 ENCOUNTER — Ambulatory Visit (INDEPENDENT_AMBULATORY_CARE_PROVIDER_SITE_OTHER): Payer: Medicare Other | Admitting: Internal Medicine

## 2017-04-29 VITALS — BP 130/69 | HR 63 | Resp 16 | Ht 73.0 in | Wt 204.6 lb

## 2017-04-29 DIAGNOSIS — I447 Left bundle-branch block, unspecified: Secondary | ICD-10-CM | POA: Diagnosis not present

## 2017-04-29 DIAGNOSIS — I519 Heart disease, unspecified: Secondary | ICD-10-CM | POA: Diagnosis not present

## 2017-04-29 DIAGNOSIS — I5022 Chronic systolic (congestive) heart failure: Secondary | ICD-10-CM | POA: Diagnosis not present

## 2017-04-29 DIAGNOSIS — Z950 Presence of cardiac pacemaker: Secondary | ICD-10-CM | POA: Diagnosis not present

## 2017-04-29 DIAGNOSIS — I25118 Atherosclerotic heart disease of native coronary artery with other forms of angina pectoris: Secondary | ICD-10-CM

## 2017-04-29 LAB — CUP PACEART INCLINIC DEVICE CHECK
Brady Statistic AP VS Percent: 0.21 %
Brady Statistic AS VP Percent: 76.83 %
Brady Statistic RV Percent Paced: 14.03 %
Date Time Interrogation Session: 20190124162410
Implantable Lead Implant Date: 20170914
Implantable Lead Location: 753859
Implantable Lead Model: 5076
Implantable Lead Model: 5076
Implantable Pulse Generator Implant Date: 20170914
Lead Channel Impedance Value: 380 Ohm
Lead Channel Impedance Value: 418 Ohm
Lead Channel Impedance Value: 475 Ohm
Lead Channel Impedance Value: 665 Ohm
Lead Channel Pacing Threshold Amplitude: 0.5 V
Lead Channel Pacing Threshold Pulse Width: 0.4 ms
Lead Channel Pacing Threshold Pulse Width: 1 ms
Lead Channel Setting Pacing Amplitude: 2.5 V
Lead Channel Setting Pacing Amplitude: 4 V
Lead Channel Setting Pacing Pulse Width: 1 ms
MDC IDC LEAD IMPLANT DT: 20170914
MDC IDC LEAD IMPLANT DT: 20170914
MDC IDC LEAD LOCATION: 753858
MDC IDC LEAD LOCATION: 753860
MDC IDC MSMT BATTERY REMAINING LONGEVITY: 55 mo
MDC IDC MSMT BATTERY VOLTAGE: 2.97 V
MDC IDC MSMT LEADCHNL LV IMPEDANCE VALUE: 437 Ohm
MDC IDC MSMT LEADCHNL LV IMPEDANCE VALUE: 646 Ohm
MDC IDC MSMT LEADCHNL LV IMPEDANCE VALUE: 703 Ohm
MDC IDC MSMT LEADCHNL LV IMPEDANCE VALUE: 722 Ohm
MDC IDC MSMT LEADCHNL LV IMPEDANCE VALUE: 741 Ohm
MDC IDC MSMT LEADCHNL LV PACING THRESHOLD AMPLITUDE: 3.25 V
MDC IDC MSMT LEADCHNL RA IMPEDANCE VALUE: 323 Ohm
MDC IDC MSMT LEADCHNL RA IMPEDANCE VALUE: 494 Ohm
MDC IDC MSMT LEADCHNL RA PACING THRESHOLD AMPLITUDE: 0.75 V
MDC IDC MSMT LEADCHNL RA PACING THRESHOLD PULSEWIDTH: 0.4 ms
MDC IDC MSMT LEADCHNL RA SENSING INTR AMPL: 4.125 mV
MDC IDC MSMT LEADCHNL RV SENSING INTR AMPL: 10.75 mV
MDC IDC SET LEADCHNL RA PACING AMPLITUDE: 2 V
MDC IDC SET LEADCHNL RV PACING PULSEWIDTH: 0.4 ms
MDC IDC SET LEADCHNL RV SENSING SENSITIVITY: 2.8 mV
MDC IDC STAT BRADY AP VP PERCENT: 7.93 %
MDC IDC STAT BRADY AS VS PERCENT: 15.03 %
MDC IDC STAT BRADY RA PERCENT PACED: 10.78 %

## 2017-04-29 NOTE — Patient Instructions (Signed)
Medication Instructions:  Your physician recommends that you continue on your current medications as directed. Please refer to the Current Medication list given to you today.  Labwork: None ordered.  Testing/Procedures: None ordered.  Follow-Up: Your physician wants you to follow-up in: one year with Dr. Lovena Le.   You will receive a reminder letter in the mail two months in advance. If you don't receive a letter, please call our office to schedule the follow-up appointment.  Remote monitoring is used to monitor your Pacemaker from home. This monitoring reduces the number of office visits required to check your device to one time per year. It allows [place] to keep an eye on the functioning of your device to ensure it is working properly. You are scheduled for a device check from home on 07/19/2017. You may send your transmission at any time that day. If you have a wireless device, the transmission will be sent automatically. After your physician reviews your transmission, you will receive a postcard with your next transmission date.  Any Other Special Instructions Will Be Listed Below (If Applicable).  If you need a refill on your cardiac medications before your next appointment, please call your pharmacy.

## 2017-04-29 NOTE — Progress Notes (Signed)
HPI Mr. Hanzlik returns today for ongoing followup, s/p BiV device implant. He is a pleasant 84 yo man with a h/o chronic systolic heart failure, class 2, LBBB, who has severe LV dysfunction. He is on maximal medical therapy. He has never had syncope. He underwent BiV PM insertion over a year ago. He had diaphragmatic stimulation and a fairly high threshold and was reprogrammed 4-can. His stimulation resolved. His CXR demonstrated no movement of the lead. The patient has felt well. He does not have palpitations. No new problems Allergies  Allergen Reactions  . Bee Venom Anaphylaxis    Honey bee      Current Outpatient Medications  Medication Sig Dispense Refill  . allopurinol (ZYLOPRIM) 100 MG tablet TAKE 2 TABLETS BY MOUTH DAILY FOR GOUT. 60 tablet 5  . aspirin EC 81 MG tablet Take 81 mg by mouth daily. Reported on 08/29/2015    . clobetasol cream (TEMOVATE) 7.32 % Apply 1 application topically 2 (two) times daily as needed.    . finasteride (PROSCAR) 5 MG tablet TAKE ONE TABLET BY MOUTH ONCE DAILY PROSTATE. 30 tablet 0  . furosemide (LASIX) 20 MG tablet TAKE (1) TABLET BY MOUTH EACH MORNING FOR FLUID. 30 tablet 0  . metFORMIN (GLUCOPHAGE) 500 MG tablet TAKE ONE TABLET BY MOUTH DAILY FOR DIABETES. 30 tablet 0  . metoprolol succinate (TOPROL-XL) 25 MG 24 hr tablet Take 2 tablets (50 mg total) by mouth daily. 180 tablet 1  . mineral oil-hydrophilic petrolatum (AQUAPHOR) ointment Apply 1 application topically as needed for dry skin.    . potassium chloride (K-DUR) 10 MEQ tablet TAKE 2 TABLETS BY MOUTH DAILY FOR POTASSIUM REPLACEMENT. 60 tablet 5  . potassium chloride (K-DUR) 10 MEQ tablet TAKE 2 TABLETS BY MOUTH DAILY FOR POTASSIUM REPLACEMENT. 60 tablet 1  . sacubitril-valsartan (ENTRESTO) 24-26 MG TAKE ONE TABLET BY MOUTH 2 TIMES A DAY. 60 tablet 6  . simvastatin (ZOCOR) 40 MG tablet TAKE 1 TABLET BY MOUTH ONCE DAILY FOR CHOLESTEROL. 30 tablet 0   No current facility-administered  medications for this visit.      Past Medical History:  Diagnosis Date  . Arthritis    "right knee" (12/19/2015)  . CHF (congestive heart failure) ([place])    Abnormal echo January 2017  . History of colon polyps   . History of gout   . Hyperlipidemia   . Hypertension   . Myocardial infarction [facility])    "they said I'd had a heart attack but didn't know it" (12/19/2015)  . Presence of permanent cardiac pacemaker   . Seasonal allergies   . Skin cancer    "arms/hands" (12/19/2015)  . Sleep apnea    supposed to wear CPAP, but doesn't (12/19/2015)  . Type II diabetes mellitus ([facility])     ROS:   All systems reviewed and negative except as noted in the HPI.   Past Surgical History:  Procedure Laterality Date  . CATARACT EXTRACTION W/PHACO Right 09/08/2012   Procedure: CATARACT EXTRACTION PHACO AND INTRAOCULAR LENS PLACEMENT (IOC);  Surgeon: Tonny Branch, MD;  Location: [facility];  Service: Ophthalmology;  Laterality: Right;  CDE: 21.28  . CATARACT EXTRACTION W/PHACO Left 12/08/2012   Procedure: CATARACT EXTRACTION PHACO AND INTRAOCULAR LENS PLACEMENT (IOC);  Surgeon: Tonny Branch, MD;  Location: [facility];  Service: Ophthalmology;  Laterality: Left;  CDE:25.72  . [place]  . COLONOSCOPY     4 procedures  . COLONOSCOPY  2012   Dr. Sharlett Iles: diverticulosis,  lipoma in ascending colon.   . EP IMPLANTABLE DEVICE N/A 12/19/2015   Procedure: BiV Pacemaker Insertion CRT-P;  Surgeon: Evans Lance, MD;  Location: [place] [facility];  Service: Cardiovascular;  Laterality: N/A;  . INSERT / REPLACE / REMOVE PACEMAKER  12/19/2015  . JOINT REPLACEMENT    . POLYPECTOMY    . TONSILLECTOMY    . TOTAL KNEE ARTHROPLASTY Left 2000s  . TRANSURETHRAL RESECTION OF PROSTATE    . YAG LASER APPLICATION Right 5/91/6384   Procedure: YAG LASER APPLICATION;  Surgeon: [name], MD;  Location: [facility];  Service: Ophthalmology;  Laterality: Right;  . YAG LASER APPLICATION Left 6/65/9935   Procedure: YAG  LASER APPLICATION;  Surgeon: [name], MD;  Location: [facility];  Service: Ophthalmology;  Laterality: Left;     Family History  Problem Relation Age of Onset  . Lung cancer Father   . Colon cancer Neg Hx      Social History   Socioeconomic History  . Marital status: Married    Spouse name: Not on file  . Number of children: Not on file  . Years of education: Not on file  . Highest education level: Not on file  Social Needs  . Financial resource strain: Not on file  . Food insecurity - worry: Not on file  . Food insecurity - inability: Not on file  . Transportation needs - medical: Not on file  . Transportation needs - non-medical: Not on file  Occupational History  . Not on file  Tobacco Use  . Smoking status: Never Smoker  . Smokeless tobacco: Never Used  Substance and Sexual Activity  . Alcohol use: Yes    Alcohol/week: 9.0 oz    Types: 7 Standard drinks or equivalent, 8 Shots of liquor per week    Comment: 12/19/2015 bour[place] 4oz 4 days/wk  . Drug use: No  . Sexual activity: Not Currently    Birth control/protection: None  Other Topics Concern  . Not on file  Social History Narrative  . Not on file     BP 130/69   Pulse 63   Resp 16   Ht 6\' 1"  (1.854 m)   Wt 204 lb 9.6 oz (92.8 kg)   SpO2 97%   BMI 26.99 kg/m   Physical Exam:  Well appearing 84 yo man, NAD HEENT: Unremarkable Neck:  No JVD, no thyromegally Lymphatics:  No adenopathy Back:  No CVA tenderness Lungs:  Clear with no wheezes HEART:  Regular rate rhythm, no murmurs, no rubs, no clicks Abd:  soft, positive bowel sounds, no organomegally, no rebound, no guarding Ext:  2 plus pulses, no edema, no cyanosis, no clubbing Skin:  No rashes no nodules Neuro:  CN II through XII intact, motor grossly intact  EKG - nsr with biv pacing  DEVICE  Normal device function.  See PaceArt for details.   Assess/Plan: 1. Chronic systolic heart failure - his symptoms remain class 2. He will continue  his current meds. I have asked him to reduce his salt intake. 2. PVC's - this is reducing his Biv pacing percentage. However, I do not think AA drug therapy is indicated.  3. PPM - His [employer] biv PM is working normally. Will recheck in several months. 4. HTN - he is controlled. I have asked him to try to do better on the salt intake which is still a problem.   Mikle Bosworth.D.

## 2017-05-05 ENCOUNTER — Other Ambulatory Visit: Payer: Self-pay | Admitting: Family Medicine

## 2017-05-05 NOTE — Telephone Encounter (Signed)
Final refill-notify pharmacy patient must do office visit

## 2017-05-14 ENCOUNTER — Other Ambulatory Visit: Payer: Self-pay | Admitting: Family Medicine

## 2017-05-18 DIAGNOSIS — E1142 Type 2 diabetes mellitus with diabetic polyneuropathy: Secondary | ICD-10-CM | POA: Diagnosis not present

## 2017-05-18 DIAGNOSIS — B351 Tinea unguium: Secondary | ICD-10-CM | POA: Diagnosis not present

## 2017-05-22 ENCOUNTER — Other Ambulatory Visit: Payer: Self-pay | Admitting: Family Medicine

## 2017-06-04 ENCOUNTER — Other Ambulatory Visit: Payer: Self-pay | Admitting: Family Medicine

## 2017-06-11 ENCOUNTER — Other Ambulatory Visit: Payer: Self-pay | Admitting: Family Medicine

## 2017-06-22 ENCOUNTER — Ambulatory Visit (INDEPENDENT_AMBULATORY_CARE_PROVIDER_SITE_OTHER): Payer: Medicare Other | Admitting: Urology

## 2017-06-22 DIAGNOSIS — R310 Gross hematuria: Secondary | ICD-10-CM | POA: Diagnosis not present

## 2017-06-24 ENCOUNTER — Other Ambulatory Visit: Payer: Self-pay | Admitting: Urology

## 2017-06-24 ENCOUNTER — Other Ambulatory Visit: Payer: Self-pay | Admitting: Family Medicine

## 2017-06-24 DIAGNOSIS — R310 Gross hematuria: Secondary | ICD-10-CM

## 2017-06-25 NOTE — Telephone Encounter (Signed)
May have this +1 refill needs follow-up office visit this spring

## 2017-06-30 ENCOUNTER — Ambulatory Visit (HOSPITAL_COMMUNITY): Payer: Medicare Other

## 2017-07-08 ENCOUNTER — Ambulatory Visit: Payer: Medicare Other | Admitting: Family Medicine

## 2017-07-09 ENCOUNTER — Other Ambulatory Visit: Payer: Self-pay | Admitting: Family Medicine

## 2017-07-09 ENCOUNTER — Other Ambulatory Visit: Payer: Self-pay | Admitting: Internal Medicine

## 2017-07-12 ENCOUNTER — Other Ambulatory Visit: Payer: Self-pay | Admitting: Family Medicine

## 2017-07-19 ENCOUNTER — Ambulatory Visit (INDEPENDENT_AMBULATORY_CARE_PROVIDER_SITE_OTHER): Payer: Medicare Other | Admitting: *Deleted

## 2017-07-19 DIAGNOSIS — I429 Cardiomyopathy, unspecified: Secondary | ICD-10-CM | POA: Diagnosis not present

## 2017-07-19 DIAGNOSIS — I5022 Chronic systolic (congestive) heart failure: Secondary | ICD-10-CM

## 2017-07-19 NOTE — Progress Notes (Signed)
Remote pacemaker transmission.   

## 2017-07-21 ENCOUNTER — Encounter: Payer: Self-pay | Admitting: Cardiology

## 2017-07-21 LAB — CUP PACEART REMOTE DEVICE CHECK
Battery Voltage: 2.96 V
Brady Statistic AP VP Percent: 8.58 %
Brady Statistic AP VS Percent: 0.2 %
Brady Statistic RA Percent Paced: 10.65 %
Brady Statistic RV Percent Paced: 12.49 %
Implantable Lead Implant Date: 20170914
Implantable Lead Implant Date: 20170914
Implantable Lead Location: 753858
Implantable Lead Location: 753859
Implantable Lead Model: 4598
Implantable Lead Model: 5076
Implantable Lead Model: 5076
Lead Channel Impedance Value: 418 Ohm
Lead Channel Impedance Value: 456 Ohm
Lead Channel Impedance Value: 475 Ohm
Lead Channel Impedance Value: 494 Ohm
Lead Channel Impedance Value: 684 Ohm
Lead Channel Impedance Value: 741 Ohm
Lead Channel Impedance Value: 779 Ohm
Lead Channel Impedance Value: 779 Ohm
Lead Channel Pacing Threshold Amplitude: 0.5 V
Lead Channel Pacing Threshold Pulse Width: 0.4 ms
Lead Channel Pacing Threshold Pulse Width: 1 ms
Lead Channel Sensing Intrinsic Amplitude: 11.25 mV
Lead Channel Sensing Intrinsic Amplitude: 11.25 mV
Lead Channel Setting Pacing Amplitude: 2.5 V
Lead Channel Setting Pacing Amplitude: 4 V
MDC IDC LEAD IMPLANT DT: 20170914
MDC IDC LEAD LOCATION: 753860
MDC IDC MSMT BATTERY REMAINING LONGEVITY: 51 mo
MDC IDC MSMT LEADCHNL LV IMPEDANCE VALUE: 361 Ohm
MDC IDC MSMT LEADCHNL LV IMPEDANCE VALUE: 418 Ohm
MDC IDC MSMT LEADCHNL LV IMPEDANCE VALUE: 418 Ohm
MDC IDC MSMT LEADCHNL LV IMPEDANCE VALUE: 475 Ohm
MDC IDC MSMT LEADCHNL LV IMPEDANCE VALUE: 665 Ohm
MDC IDC MSMT LEADCHNL LV PACING THRESHOLD AMPLITUDE: 3.25 V
MDC IDC MSMT LEADCHNL RA IMPEDANCE VALUE: 323 Ohm
MDC IDC MSMT LEADCHNL RA PACING THRESHOLD AMPLITUDE: 0.75 V
MDC IDC MSMT LEADCHNL RA PACING THRESHOLD PULSEWIDTH: 0.4 ms
MDC IDC MSMT LEADCHNL RA SENSING INTR AMPL: 1.375 mV
MDC IDC MSMT LEADCHNL RA SENSING INTR AMPL: 1.375 mV
MDC IDC PG IMPLANT DT: 20170914
MDC IDC SESS DTM: 20190415043524
MDC IDC SET LEADCHNL LV PACING PULSEWIDTH: 1 ms
MDC IDC SET LEADCHNL RA PACING AMPLITUDE: 2 V
MDC IDC SET LEADCHNL RV PACING PULSEWIDTH: 0.4 ms
MDC IDC SET LEADCHNL RV SENSING SENSITIVITY: 2.8 mV
MDC IDC STAT BRADY AS VP PERCENT: 76.89 %
MDC IDC STAT BRADY AS VS PERCENT: 14.33 %

## 2017-07-23 ENCOUNTER — Ambulatory Visit (HOSPITAL_COMMUNITY)
Admission: RE | Admit: 2017-07-23 | Discharge: 2017-07-23 | Disposition: A | Discharge status: Home / Self Care | Payer: Medicare Other | Source: Ambulatory Visit | Attending: Urology | Admitting: Urology

## 2017-07-23 DIAGNOSIS — Z9049 Acquired absence of other specified parts of digestive tract: Secondary | ICD-10-CM | POA: Insufficient documentation

## 2017-07-23 DIAGNOSIS — K838 Other specified diseases of biliary tract: Secondary | ICD-10-CM | POA: Diagnosis not present

## 2017-07-23 DIAGNOSIS — R310 Gross hematuria: Secondary | ICD-10-CM | POA: Diagnosis not present

## 2017-07-23 DIAGNOSIS — J90 Pleural effusion, not elsewhere classified: Secondary | ICD-10-CM | POA: Diagnosis not present

## 2017-07-23 DIAGNOSIS — I700 Atherosclerosis of aorta: Secondary | ICD-10-CM | POA: Insufficient documentation

## 2017-07-23 DIAGNOSIS — R9341 Abnormal radiologic findings on diagnostic imaging of renal pelvis, ureter, or bladder: Secondary | ICD-10-CM | POA: Insufficient documentation

## 2017-07-23 DIAGNOSIS — N289 Disorder of kidney and ureter, unspecified: Secondary | ICD-10-CM | POA: Insufficient documentation

## 2017-07-23 LAB — POCT I-STAT CREATININE: CREATININE: 1 mg/dL (ref 0.61–1.24)

## 2017-07-23 MED ORDER — IOPAMIDOL (ISOVUE-300) INJECTION 61%
150.0000 mL | Freq: Once | INTRAVENOUS | Status: AC | PRN
  Administered 2017-07-23: 125 mL via INTRAVENOUS

## 2017-07-24 ENCOUNTER — Other Ambulatory Visit: Payer: Self-pay | Admitting: Family Medicine

## 2017-07-27 DIAGNOSIS — B351 Tinea unguium: Secondary | ICD-10-CM | POA: Diagnosis not present

## 2017-07-27 DIAGNOSIS — E1142 Type 2 diabetes mellitus with diabetic polyneuropathy: Secondary | ICD-10-CM | POA: Diagnosis not present

## 2017-07-28 ENCOUNTER — Ambulatory Visit (INDEPENDENT_AMBULATORY_CARE_PROVIDER_SITE_OTHER): Payer: Medicare Other | Admitting: Urology

## 2017-07-28 DIAGNOSIS — R310 Gross hematuria: Secondary | ICD-10-CM | POA: Diagnosis not present

## 2017-08-05 ENCOUNTER — Other Ambulatory Visit: Payer: Self-pay | Admitting: Family Medicine

## 2017-08-05 NOTE — Telephone Encounter (Signed)
May have this +1 additional refill needs office visit

## 2017-08-09 ENCOUNTER — Other Ambulatory Visit: Payer: Self-pay | Admitting: Urology

## 2017-08-09 NOTE — Patient Instructions (Signed)
Anthony Lucas  08/09/2017     @PREFPERIOPPHARMACY @   Your procedure is scheduled on  08/11/2017 .  Report to [facility] at  1100   A.M.  Call this number if you have problems the morning of surgery:  [phone]   Remember:  Do not eat food or drink liquids after midnight.  Take these medicines the morning of surgery with A SIP OF WATER  none   Do not wear jewelry, make-up or nail polish.  Do not wear lotions, powders, or perfumes, or deodorant.  Do not shave 48 hours prior to surgery.  Men may shave face and neck.  Do not bring valuables to the hospital.  [facility] is not responsible for any belongings or valuables.  Contacts, dentures or bridgework may not be worn into surgery.  Leave your suitcase in the car.  After surgery it may be brought to your room.  For patients admitted to the hospital, discharge time will be determined by your treatment team.  Patients discharged the day of surgery will not be allowed to drive home.   Name and phone number of your driver:   family Special instructions:  None  Please read over the following fact sheets that you were given. Anesthesia Post-op Instructions and Care and Recovery After Surgery       Cystoscopy Cystoscopy is a procedure that is used to help diagnose and sometimes treat conditions that affect that lower urinary tract. The lower urinary tract includes the bladder and the tube that drains urine from the bladder out of the body (urethra). Cystoscopy is performed with a thin, tube-shaped instrument with a light and camera at the end (cystoscope). The cystoscope may be hard (rigid) or flexible, depending on the goal of the procedure.The cystoscope is inserted through the urethra, into the bladder. Cystoscopy may be recommended if you have:  Urinary tractinfections that keep coming back (recurring).  Blood in the urine (hematuria).  Loss of bladder control (urinary incontinence) or an overactive  bladder.  Unusual cells found in a urine sample.  A blockage in the urethra.  Painful urination.  An abnormality in the bladder found during an intravenous pyelogram (IVP) or CT scan.  Cystoscopy may also be done to remove a sample of tissue to be examined under a microscope (biopsy). Tell a health care provider about:  Any allergies you have.  All medicines you are taking, including vitamins, herbs, eye drops, creams, and over-the-counter medicines.  Any problems you or family members have had with anesthetic medicines.  Any blood disorders you have.  Any surgeries you have had.  Any medical conditions you have.  Whether you are pregnant or may be pregnant. What are the risks? Generally, this is a safe procedure. However, problems may occur, including:  Infection.  Bleeding.  Allergic reactions to medicines.  Damage to other structures or organs.  What happens before the procedure?  Ask your health care provider about: ? Changing or stopping your regular medicines. This is especially important if you are taking diabetes medicines or blood thinners. ? Taking medicines such as aspirin and ibuprofen. These medicines can thin your blood. Do not take these medicines before your procedure if your health care provider instructs you not to.  Follow instructions from your health care provider about eating or drinking restrictions.  You may be given antibiotic medicine to help prevent infection.  You may have an exam or  testing, such as X-rays of the bladder, urethra, or kidneys.  You may have urine tests to check for signs of infection.  Plan to have someone take you home after the procedure. What happens during the procedure?  To reduce your risk of infection,your health care team will wash or sanitize their hands.  You will be given one or more of the following: ? A medicine to help you relax (sedative). ? A medicine to numb the area (local anesthetic).  The  area around the opening of your urethra will be cleaned.  The cystoscope will be passed through your urethra into your bladder.  Germ-free (sterile)fluid will flow through the cystoscope to fill your bladder. The fluid will stretch your bladder so that your surgeon can clearly examine your bladder walls.  The cystoscope will be removed and your bladder will be emptied. The procedure may vary among health care providers and hospitals. What happens after the procedure?  You may have some soreness or pain in your abdomen and urethra. Medicines will be available to help you.  You may have some blood in your urine.  Do not drive for 24 hours if you received a sedative. This information is not intended to replace advice given to you by your health care provider. Make sure you discuss any questions you have with your health care provider. Document Released: 03/20/2000 Document Revised: 08/01/2015 Document Reviewed: 02/07/2015 Elsevier Interactive Patient Education  2018 [employer].  Cystoscopy, Care After Refer to this sheet in the next few weeks. These instructions provide you with information about caring for yourself after your procedure. Your health care provider may also give you more specific instructions. Your treatment has been planned according to current medical practices, but problems sometimes occur. Call your health care provider if you have any problems or questions after your procedure. What can I expect after the procedure? After the procedure, it is common to have:  Mild pain when you urinate. Pain should stop within a few minutes after you urinate. This may last for up to 1 week.  A small amount of blood in your urine for several days.  Feeling like you need to urinate but producing only a small amount of urine.  Follow these instructions at home:  Medicines  Take over-the-counter and prescription medicines only as told by your health care provider.  If you were  prescribed an antibiotic medicine, take it as told by your health care provider. Do not stop taking the antibiotic even if you start to feel better. General instructions   Return to your normal activities as told by your health care provider. Ask your health care provider what activities are safe for you.  Do not drive for 24 hours if you received a sedative.  Watch for any blood in your urine. If the amount of blood in your urine increases, call your health care provider.  Follow instructions from your health care provider about eating or drinking restrictions.  If a tissue sample was removed for testing (biopsy) during your procedure, it is your responsibility to get your test results. Ask your health care provider or the department performing the test when your results will be ready.  Drink enough fluid to keep your urine clear or pale yellow.  Keep all follow-up visits as told by your health care provider. This is important. Contact a health care provider if:  You have pain that gets worse or does not get better with medicine, especially pain when you urinate.  You have difficulty urinating. Get help right away if:  You have more blood in your urine.  You have blood clots in your urine.  You have abdominal pain.  You have a fever or chills.  You are unable to urinate. This information is not intended to replace advice given to you by your health care provider. Make sure you discuss any questions you have with your health care provider. Document Released: 10/10/2004 Document Revised: 08/29/2015 Document Reviewed: 02/07/2015 Elsevier Interactive Patient Education  2018 [place] Anesthesia, Adult General anesthesia is the use of medicines to make a person "go to sleep" (be unconscious) for a medical procedure. General anesthesia is often recommended when a procedure:  Is long.  Requires you to be still or in an unusual position.  Is major and can cause you  to lose blood.  Is impossible to do without general anesthesia.  The medicines used for general anesthesia are called general anesthetics. In addition to making you sleep, the medicines:  Prevent pain.  Control your blood pressure.  Relax your muscles.  Tell a health care provider about:  Any allergies you have.  All medicines you are taking, including vitamins, herbs, eye drops, creams, and over-the-counter medicines.  Any problems you or family members have had with anesthetic medicines.  Types of anesthetics you have had in the past.  Any bleeding disorders you have.  Any surgeries you have had.  Any medical conditions you have.  Any history of heart or lung conditions, such as heart failure, sleep apnea, or chronic obstructive pulmonary disease (COPD).  Whether you are pregnant or may be pregnant.  Whether you use tobacco, alcohol, marijuana, or street drugs.  Any history of Armed forces logistics/support/administrative officer.  Any history of depression or anxiety. What are the risks? Generally, this is a safe procedure. However, problems may occur, including:  Allergic reaction to anesthetics.  Lung and heart problems.  Inhaling food or liquids from your stomach into your lungs (aspiration).  Injury to nerves.  Waking up during your procedure and being unable to move (rare).  Extreme agitation or a state of mental confusion (delirium) when you wake up from the anesthetic.  Air in the bloodstream, which can lead to stroke.  These problems are more likely to develop if you are having a major surgery or if you have an advanced medical condition. You can prevent some of these complications by answering all of your health care provider's questions thoroughly and by following all pre-procedure instructions. General anesthesia can cause side effects, including:  Nausea or vomiting  A sore throat from the breathing tube.  Feeling cold or shivery.  Feeling tired, washed out, or  achy.  Sleepiness or drowsiness.  Confusion or agitation.  What happens before the procedure? Staying hydrated Follow instructions from your health care provider about hydration, which may include:  Up to 2 hours before the procedure - you may continue to drink clear liquids, such as water, clear fruit juice, black coffee, and plain tea.  Eating and drinking restrictions Follow instructions from your health care provider about eating and drinking, which may include:  8 hours before the procedure - stop eating heavy meals or foods such as meat, fried foods, or fatty foods.  6 hours before the procedure - stop eating light meals or foods, such as toast or cereal.  6 hours before the procedure - stop drinking milk or drinks that contain milk.  2 hours before the procedure - stop drinking clear  liquids.  Medicines  Ask your health care provider about: ? Changing or stopping your regular medicines. This is especially important if you are taking diabetes medicines or blood thinners. ? Taking medicines such as aspirin and ibuprofen. These medicines can thin your blood. Do not take these medicines before your procedure if your health care provider instructs you not to. ? Taking new dietary supplements or medicines. Do not take these during the week before your procedure unless your health care provider approves them.  If you are told to take a medicine or to continue taking a medicine on the day of the procedure, take the medicine with sips of water. General instructions   Ask if you will be going home the same day, the following day, or after a longer hospital stay. ? Plan to have someone take you home. ? Plan to have someone stay with you for the first 24 hours after you leave the hospital or clinic.  For 3-6 weeks before the procedure, try not to use any tobacco products, such as cigarettes, chewing tobacco, and e-cigarettes.  You may brush your teeth on the morning of the  procedure, but make sure to spit out the toothpaste. What happens during the procedure?  You will be given anesthetics through a mask and through an IV tube in one of your veins.  You may receive medicine to help you relax (sedative).  As soon as you are asleep, a breathing tube may be used to help you breathe.  An anesthesia specialist will stay with you throughout the procedure. He or she will help keep you comfortable and safe by continuing to give you medicines and adjusting the amount of medicine that you get. He or she will also watch your blood pressure, pulse, and oxygen levels to make sure that the anesthetics do not cause any problems.  If a breathing tube was used to help you breathe, it will be removed before you wake up. The procedure may vary among health care providers and hospitals. What happens after the procedure?  You will wake up, often slowly, after the procedure is complete, usually in a recovery area.  Your blood pressure, heart rate, breathing rate, and blood oxygen level will be monitored until the medicines you were given have worn off.  You may be given medicine to help you calm down if you feel anxious or agitated.  If you will be going home the same day, your health care provider may check to make sure you can stand, drink, and urinate.  Your health care providers will treat your pain and side effects before you go home.  Do not drive for 24 hours if you received a sedative.  You may: ? Feel nauseous and vomit. ? Have a sore throat. ? Have mental slowness. ? Feel cold or shivery. ? Feel sleepy. ? Feel tired. ? Feel sore or achy, even in parts of your body where you did not have surgery. This information is not intended to replace advice given to you by your health care provider. Make sure you discuss any questions you have with your health care provider. Document Released: 06/30/2007 Document Revised: 09/03/2015 Document Reviewed: 03/07/2015 Elsevier  Interactive Patient Education  2018 [place] Anesthesia, Adult, Care After These instructions provide you with information about caring for yourself after your procedure. Your health care provider may also give you more specific instructions. Your treatment has been planned according to current medical practices, but problems sometimes occur. Call your health  care provider if you have any problems or questions after your procedure. What can I expect after the procedure? After the procedure, it is common to have:  Vomiting.  A sore throat.  Mental slowness.  It is common to feel:  Nauseous.  Cold or shivery.  Sleepy.  Tired.  Sore or achy, even in parts of your body where you did not have surgery.  Follow these instructions at home: For at least 24 hours after the procedure:  Do not: ? Participate in activities where you could fall or become injured. ? Drive. ? Use heavy machinery. ? Drink alcohol. ? Take sleeping pills or medicines that cause drowsiness. ? Make important decisions or sign legal documents. ? Take care of children on your own.  Rest. Eating and drinking  If you vomit, drink water, juice, or soup when you can drink without vomiting.  Drink enough fluid to keep your urine clear or pale yellow.  Make sure you have little or no nausea before eating solid foods.  Follow the diet recommended by your health care provider. General instructions  Have a responsible adult stay with you until you are awake and alert.  Return to your normal activities as told by your health care provider. Ask your health care provider what activities are safe for you.  Take over-the-counter and prescription medicines only as told by your health care provider.  If you smoke, do not smoke without supervision.  Keep all follow-up visits as told by your health care provider. This is important. Contact a health care provider if:  You continue to have nausea or  vomiting at home, and medicines are not helpful.  You cannot drink fluids or start eating again.  You cannot urinate after 8-12 hours.  You develop a skin rash.  You have fever.  You have increasing redness at the site of your procedure. Get help right away if:  You have difficulty breathing.  You have chest pain.  You have unexpected bleeding.  You feel that you are having a life-threatening or urgent problem. This information is not intended to replace advice given to you by your health care provider. Make sure you discuss any questions you have with your health care provider. Document Released: 06/29/2000 Document Revised: 08/26/2015 Document Reviewed: 03/07/2015 Elsevier Interactive Patient Education  2018 [place] Anesthesia is a term that refers to techniques, procedures, and medicines that help a person stay safe and comfortable during a medical procedure. Monitored anesthesia care, or sedation, is one type of anesthesia. Your anesthesia specialist may recommend sedation if you will be having a procedure that does not require you to be unconscious, such as:  Cataract surgery.  A dental procedure.  A biopsy.  A colonoscopy.  During the procedure, you may receive a medicine to help you relax (sedative). There are three levels of sedation:  Mild sedation. At this level, you may feel awake and relaxed. You will be able to follow directions.  Moderate sedation. At this level, you will be sleepy. You may not remember the procedure.  Deep sedation. At this level, you will be asleep. You will not remember the procedure.  The more medicine you are given, the deeper your level of sedation will be. Depending on how you respond to the procedure, the anesthesia specialist may change your level of sedation or the type of anesthesia to fit your needs. An anesthesia specialist will monitor you closely during the procedure. Let your health care  provider know about:  Any allergies you have.  All medicines you are taking, including vitamins, herbs, eye drops, creams, and over-the-counter medicines.  Any use of steroids (by mouth or as a cream).  Any problems you or family members have had with sedatives and anesthetic medicines.  Any blood disorders you have.  Any surgeries you have had.  Any medical conditions you have, such as sleep apnea.  Whether you are pregnant or may be pregnant.  Any use of cigarettes, alcohol, or street drugs. What are the risks? Generally, this is a safe procedure. However, problems may occur, including:  Getting too much medicine (oversedation).  Nausea.  Allergic reaction to medicines.  Trouble breathing. If this happens, a breathing tube may be used to help with breathing. It will be removed when you are awake and breathing on your own.  Heart trouble.  Lung trouble.  Before the procedure Staying hydrated Follow instructions from your health care provider about hydration, which may include:  Up to 2 hours before the procedure - you may continue to drink clear liquids, such as water, clear fruit juice, black coffee, and plain tea.  Eating and drinking restrictions Follow instructions from your health care provider about eating and drinking, which may include:  8 hours before the procedure - stop eating heavy meals or foods such as meat, fried foods, or fatty foods.  6 hours before the procedure - stop eating light meals or foods, such as toast or cereal.  6 hours before the procedure - stop drinking milk or drinks that contain milk.  2 hours before the procedure - stop drinking clear liquids.  Medicines Ask your health care provider about:  Changing or stopping your regular medicines. This is especially important if you are taking diabetes medicines or blood thinners.  Taking medicines such as aspirin and ibuprofen. These medicines can thin your blood. Do not take these  medicines before your procedure if your health care provider instructs you not to.  Tests and exams  You will have a physical exam.  You may have blood tests done to show: ? How well your kidneys and liver are working. ? How well your blood can clot.  General instructions  Plan to have someone take you home from the hospital or clinic.  If you will be going home right after the procedure, plan to have someone with you for 24 hours.  What happens during the procedure?  Your blood pressure, heart rate, breathing, level of pain and overall condition will be monitored.  An IV tube will be inserted into one of your veins.  Your anesthesia specialist will give you medicines as needed to keep you comfortable during the procedure. This may mean changing the level of sedation.  The procedure will be performed. After the procedure  Your blood pressure, heart rate, breathing rate, and blood oxygen level will be monitored until the medicines you were given have worn off.  Do not drive for 24 hours if you received a sedative.  You may: ? Feel sleepy, clumsy, or nauseous. ? Feel forgetful about what happened after the procedure. ? Have a sore throat if you had a breathing tube during the procedure. ? Vomit. This information is not intended to replace advice given to you by your health care provider. Make sure you discuss any questions you have with your health care provider. Document Released: 12/17/2004 Document Revised: 08/30/2015 Document Reviewed: 07/14/2015 Elsevier Interactive Patient Education  2018 [place], Care  After These instructions provide you with information about caring for yourself after your procedure. Your health care provider may also give you more specific instructions. Your treatment has been planned according to current medical practices, but problems sometimes occur. Call your health care provider if you have any problems or questions  after your procedure. What can I expect after the procedure? After your procedure, it is common to:  Feel sleepy for several hours.  Feel clumsy and have poor balance for several hours.  Feel forgetful about what happened after the procedure.  Have poor judgment for several hours.  Feel nauseous or vomit.  Have a sore throat if you had a breathing tube during the procedure.  Follow these instructions at home: For at least 24 hours after the procedure:   Do not: ? Participate in activities in which you could fall or become injured. ? Drive. ? Use heavy machinery. ? Drink alcohol. ? Take sleeping pills or medicines that cause drowsiness. ? Make important decisions or sign legal documents. ? Take care of children on your own.  Rest. Eating and drinking  Follow the diet that is recommended by your health care provider.  If you vomit, drink water, juice, or soup when you can drink without vomiting.  Make sure you have little or no nausea before eating solid foods. General instructions  Have a responsible adult stay with you until you are awake and alert.  Take over-the-counter and prescription medicines only as told by your health care provider.  If you smoke, do not smoke without supervision.  Keep all follow-up visits as told by your health care provider. This is important. Contact a health care provider if:  You keep feeling nauseous or you keep vomiting.  You feel light-headed.  You develop a rash.  You have a fever. Get help right away if:  You have trouble breathing. This information is not intended to replace advice given to you by your health care provider. Make sure you discuss any questions you have with your health care provider. Document Released: 07/14/2015 Document Revised: 11/13/2015 Document Reviewed: 07/14/2015 Elsevier Interactive Patient Education  [employer].

## 2017-08-10 ENCOUNTER — Encounter (HOSPITAL_COMMUNITY)
Admission: RE | Admit: 2017-08-10 | Discharge: 2017-08-10 | Disposition: A | Discharge status: Home / Self Care | Payer: Medicare Other | Source: Ambulatory Visit | Attending: Urology | Admitting: Urology

## 2017-08-10 ENCOUNTER — Encounter (HOSPITAL_COMMUNITY): Payer: Self-pay

## 2017-08-10 DIAGNOSIS — N400 Enlarged prostate without lower urinary tract symptoms: Secondary | ICD-10-CM | POA: Diagnosis present

## 2017-08-10 DIAGNOSIS — I110 Hypertensive heart disease with heart failure: Secondary | ICD-10-CM | POA: Diagnosis not present

## 2017-08-10 DIAGNOSIS — Z91030 Bee allergy status: Secondary | ICD-10-CM | POA: Diagnosis not present

## 2017-08-10 DIAGNOSIS — I868 Varicose veins of other specified sites: Secondary | ICD-10-CM | POA: Diagnosis not present

## 2017-08-10 DIAGNOSIS — Z9119 Patient's noncompliance with other medical treatment and regimen: Secondary | ICD-10-CM | POA: Diagnosis not present

## 2017-08-10 DIAGNOSIS — Z86010 Personal history of colonic polyps: Secondary | ICD-10-CM | POA: Diagnosis not present

## 2017-08-10 DIAGNOSIS — R310 Gross hematuria: Secondary | ICD-10-CM | POA: Diagnosis not present

## 2017-08-10 DIAGNOSIS — Z96652 Presence of left artificial knee joint: Secondary | ICD-10-CM | POA: Diagnosis not present

## 2017-08-10 DIAGNOSIS — N401 Enlarged prostate with lower urinary tract symptoms: Secondary | ICD-10-CM | POA: Diagnosis not present

## 2017-08-10 DIAGNOSIS — I509 Heart failure, unspecified: Secondary | ICD-10-CM | POA: Diagnosis not present

## 2017-08-10 DIAGNOSIS — E119 Type 2 diabetes mellitus without complications: Secondary | ICD-10-CM | POA: Diagnosis not present

## 2017-08-10 DIAGNOSIS — I252 Old myocardial infarction: Secondary | ICD-10-CM | POA: Diagnosis not present

## 2017-08-10 DIAGNOSIS — E785 Hyperlipidemia, unspecified: Secondary | ICD-10-CM | POA: Diagnosis not present

## 2017-08-10 DIAGNOSIS — M1711 Unilateral primary osteoarthritis, right knee: Secondary | ICD-10-CM | POA: Diagnosis not present

## 2017-08-10 DIAGNOSIS — G4730 Sleep apnea, unspecified: Secondary | ICD-10-CM | POA: Diagnosis not present

## 2017-08-10 DIAGNOSIS — Z950 Presence of cardiac pacemaker: Secondary | ICD-10-CM | POA: Diagnosis not present

## 2017-08-10 DIAGNOSIS — Z85828 Personal history of other malignant neoplasm of skin: Secondary | ICD-10-CM | POA: Diagnosis not present

## 2017-08-10 LAB — CBC WITH DIFFERENTIAL/PLATELET
Basophils Absolute: 0.1 K/uL (ref 0.0–0.1)
Basophils Relative: 1 %
Eosinophils Absolute: 0.5 K/uL (ref 0.0–0.7)
Eosinophils Relative: 7 %
HCT: 40 % (ref 39.0–52.0)
Hemoglobin: 12.7 g/dL — ABNORMAL LOW (ref 13.0–17.0)
Lymphocytes Relative: 16 %
Lymphs Abs: 1 K/uL (ref 0.7–4.0)
MCH: 27.6 pg (ref 26.0–34.0)
MCHC: 31.8 g/dL (ref 30.0–36.0)
MCV: 87 fL (ref 78.0–100.0)
Monocytes Absolute: 0.4 K/uL (ref 0.1–1.0)
Monocytes Relative: 6 %
Neutro Abs: 4.6 K/uL (ref 1.7–7.7)
Neutrophils Relative %: 70 %
Platelets: 154 K/uL (ref 150–400)
RBC: 4.6 MIL/uL (ref 4.22–5.81)
RDW: 13.9 % (ref 11.5–15.5)
WBC: 6.5 K/uL (ref 4.0–10.5)

## 2017-08-10 LAB — BASIC METABOLIC PANEL WITH GFR
Anion gap: 7 (ref 5–15)
BUN: 15 mg/dL (ref 6–20)
CO2: 29 mmol/L (ref 22–32)
Calcium: 8.9 mg/dL (ref 8.9–10.3)
Chloride: 103 mmol/L (ref 101–111)
Creatinine, Ser: 1.05 mg/dL (ref 0.61–1.24)
GFR calc Af Amer: >60 mL/min
GFR calc non Af Amer: >60 mL/min
Glucose, Bld: 160 mg/dL — ABNORMAL HIGH (ref 65–99)
Potassium: 4.3 mmol/L (ref 3.5–5.1)
Sodium: 139 mmol/L (ref 135–145)

## 2017-08-10 LAB — GLUCOSE, CAPILLARY: Glucose-Capillary: 179 mg/dL — ABNORMAL HIGH (ref 65–99)

## 2017-08-10 LAB — HEMOGLOBIN A1C
Hgb A1c MFr Bld: 6.5 % — ABNORMAL HIGH (ref 4.8–5.6)
Mean Plasma Glucose: 139.85 mg/dL

## 2017-08-11 ENCOUNTER — Ambulatory Visit (HOSPITAL_COMMUNITY)
Admission: RE | Admit: 2017-08-11 | Discharge: 2017-08-11 | Disposition: A | Discharge status: Home / Self Care | Payer: Medicare Other | Source: Ambulatory Visit | Attending: Urology | Admitting: Urology

## 2017-08-11 ENCOUNTER — Ambulatory Visit (HOSPITAL_COMMUNITY): Payer: Medicare Other | Admitting: Anesthesiology

## 2017-08-11 ENCOUNTER — Encounter (HOSPITAL_COMMUNITY): Payer: Self-pay | Admitting: *Deleted

## 2017-08-11 ENCOUNTER — Encounter (HOSPITAL_COMMUNITY)
Admission: RE | Disposition: A | Discharge status: Home / Self Care | Payer: Self-pay | Source: Ambulatory Visit | Attending: Urology

## 2017-08-11 DIAGNOSIS — N3289 Other specified disorders of bladder: Secondary | ICD-10-CM | POA: Diagnosis not present

## 2017-08-11 DIAGNOSIS — M1711 Unilateral primary osteoarthritis, right knee: Secondary | ICD-10-CM | POA: Insufficient documentation

## 2017-08-11 DIAGNOSIS — I110 Hypertensive heart disease with heart failure: Secondary | ICD-10-CM | POA: Insufficient documentation

## 2017-08-11 DIAGNOSIS — I10 Essential (primary) hypertension: Secondary | ICD-10-CM | POA: Diagnosis not present

## 2017-08-11 DIAGNOSIS — Z9119 Patient's noncompliance with other medical treatment and regimen: Secondary | ICD-10-CM | POA: Insufficient documentation

## 2017-08-11 DIAGNOSIS — Z91030 Bee allergy status: Secondary | ICD-10-CM | POA: Diagnosis not present

## 2017-08-11 DIAGNOSIS — Z85828 Personal history of other malignant neoplasm of skin: Secondary | ICD-10-CM | POA: Diagnosis not present

## 2017-08-11 DIAGNOSIS — E785 Hyperlipidemia, unspecified: Secondary | ICD-10-CM | POA: Diagnosis not present

## 2017-08-11 DIAGNOSIS — Z950 Presence of cardiac pacemaker: Secondary | ICD-10-CM | POA: Insufficient documentation

## 2017-08-11 DIAGNOSIS — I868 Varicose veins of other specified sites: Secondary | ICD-10-CM | POA: Insufficient documentation

## 2017-08-11 DIAGNOSIS — Z86010 Personal history of colonic polyps: Secondary | ICD-10-CM | POA: Insufficient documentation

## 2017-08-11 DIAGNOSIS — G4730 Sleep apnea, unspecified: Secondary | ICD-10-CM | POA: Diagnosis not present

## 2017-08-11 DIAGNOSIS — E119 Type 2 diabetes mellitus without complications: Secondary | ICD-10-CM | POA: Diagnosis not present

## 2017-08-11 DIAGNOSIS — N401 Enlarged prostate with lower urinary tract symptoms: Secondary | ICD-10-CM | POA: Diagnosis not present

## 2017-08-11 DIAGNOSIS — Z96652 Presence of left artificial knee joint: Secondary | ICD-10-CM | POA: Insufficient documentation

## 2017-08-11 DIAGNOSIS — I252 Old myocardial infarction: Secondary | ICD-10-CM | POA: Diagnosis not present

## 2017-08-11 DIAGNOSIS — R310 Gross hematuria: Secondary | ICD-10-CM | POA: Insufficient documentation

## 2017-08-11 DIAGNOSIS — I509 Heart failure, unspecified: Secondary | ICD-10-CM | POA: Insufficient documentation

## 2017-08-11 HISTORY — PX: CYSTOSCOPY WITH FULGERATION: SHX6638

## 2017-08-11 LAB — GLUCOSE, CAPILLARY
GLUCOSE-CAPILLARY: 106 mg/dL — AB (ref 65–99)
GLUCOSE-CAPILLARY: 101 mg/dL — AB (ref 65–99)

## 2017-08-11 SURGERY — CYSTOSCOPY, WITH BLADDER FULGURATION
Anesthesia: General | Site: Penis

## 2017-08-11 MED ORDER — LACTATED RINGERS IV SOLN
INTRAVENOUS | Status: DC
  Administered 2017-08-11: 11:00:00 via INTRAVENOUS

## 2017-08-11 MED ORDER — PHENYLEPHRINE 40 MCG/ML (10ML) SYRINGE FOR IV PUSH (FOR BLOOD PRESSURE SUPPORT)
PREFILLED_SYRINGE | INTRAVENOUS | Status: AC
  Filled 2017-08-11: qty 10

## 2017-08-11 MED ORDER — FENTANYL CITRATE (PF) 100 MCG/2ML IJ SOLN
INTRAMUSCULAR | Status: DC | PRN
  Administered 2017-08-11 (×3): 25 ug via INTRAVENOUS

## 2017-08-11 MED ORDER — LIDOCAINE HCL (CARDIAC) PF 50 MG/5ML IV SOSY
PREFILLED_SYRINGE | INTRAVENOUS | Status: DC | PRN
  Administered 2017-08-11: 40 mg via INTRAVENOUS

## 2017-08-11 MED ORDER — PROPOFOL 10 MG/ML IV BOLUS
INTRAVENOUS | Status: DC | PRN
  Administered 2017-08-11: 20 mg via INTRAVENOUS
  Administered 2017-08-11: 100 mg via INTRAVENOUS

## 2017-08-11 MED ORDER — FENTANYL CITRATE (PF) 100 MCG/2ML IJ SOLN: 25.0000 ug | INTRAMUSCULAR | Status: DC | PRN

## 2017-08-11 MED ORDER — SUCCINYLCHOLINE CHLORIDE 20 MG/ML IJ SOLN
INTRAMUSCULAR | Status: AC
Start: 2017-08-11 — End: ?
  Filled 2017-08-11: qty 1

## 2017-08-11 MED ORDER — BUPIVACAINE IN DEXTROSE 0.75-8.25 % IT SOLN
INTRATHECAL | Status: AC
  Filled 2017-08-11: qty 2

## 2017-08-11 MED ORDER — LACTATED RINGERS IV SOLN: INTRAVENOUS | Status: DC

## 2017-08-11 MED ORDER — WATER FOR IRRIGATION, STERILE IR SOLN
Status: DC | PRN
  Administered 2017-08-11: 1000 mL
  Administered 2017-08-11: 3000 mL

## 2017-08-11 MED ORDER — TRAMADOL HCL 50 MG PO TABS: 50.0000 mg | ORAL_TABLET | Freq: Four times a day (QID) | ORAL | 0 refills | Status: AC | PRN

## 2017-08-11 MED ORDER — CEFAZOLIN SODIUM-DEXTROSE 2-4 GM/100ML-% IV SOLN
INTRAVENOUS | Status: AC
Start: 2017-08-11 — End: ?
  Filled 2017-08-11: qty 100

## 2017-08-11 MED ORDER — LIDOCAINE HCL (PF) 1 % IJ SOLN
INTRAMUSCULAR | Status: AC
  Filled 2017-08-11: qty 5

## 2017-08-11 MED ORDER — FENTANYL CITRATE (PF) 100 MCG/2ML IJ SOLN
INTRAMUSCULAR | Status: AC
  Filled 2017-08-11: qty 2

## 2017-08-11 MED ORDER — OXYCODONE HCL 5 MG PO TABS: 5.0000 mg | ORAL_TABLET | Freq: Once | ORAL | Status: DC | PRN

## 2017-08-11 MED ORDER — CEFAZOLIN SODIUM-DEXTROSE 2-4 GM/100ML-% IV SOLN
2.0000 g | INTRAVENOUS | Status: AC
  Administered 2017-08-11: 2 g via INTRAVENOUS

## 2017-08-11 MED ORDER — PROPOFOL 10 MG/ML IV BOLUS
INTRAVENOUS | Status: AC
  Filled 2017-08-11: qty 20

## 2017-08-11 MED ORDER — EPHEDRINE SULFATE 50 MG/ML IJ SOLN
INTRAMUSCULAR | Status: AC
  Filled 2017-08-11: qty 2

## 2017-08-11 MED ORDER — SODIUM CHLORIDE 0.9 % IJ SOLN
INTRAMUSCULAR | Status: AC
  Filled 2017-08-11: qty 10

## 2017-08-11 MED ORDER — OXYCODONE HCL 5 MG/5ML PO SOLN: 5.0000 mg | Freq: Once | ORAL | Status: DC | PRN

## 2017-08-11 SURGICAL SUPPLY — 20 items
BAG DRAIN URO TABLE W/ADPT NS (DRAPE) ×3 IMPLANT
BAG DRN 8 ADPR NS SKTRN CSTL (DRAPE) ×1
BAG URINE DRAINAGE (UROLOGICAL SUPPLIES) ×2 IMPLANT
CATH FOLEY 2WAY SLVR  5CC 18FR (CATHETERS) ×2
CATH FOLEY 2WAY SLVR 5CC 18FR (CATHETERS) IMPLANT
CATH FOLEY 3WAY 30CC 22F (CATHETERS) ×2 IMPLANT
GLOVE BIO SURGEON STRL SZ8 (GLOVE) ×3 IMPLANT
GLOVE BIOGEL PI IND STRL 7.0 (GLOVE) ×2 IMPLANT
GLOVE BIOGEL PI INDICATOR 7.0 (GLOVE) ×4
GLOVE ECLIPSE 6.5 STRL STRAW (GLOVE) ×2 IMPLANT
GOWN STRL REUS W/ TWL LRG LVL3 (GOWN DISPOSABLE) ×2 IMPLANT
GOWN STRL REUS W/ TWL XL LVL3 (GOWN DISPOSABLE) ×1 IMPLANT
GOWN STRL REUS W/TWL LRG LVL3 (GOWN DISPOSABLE) ×6
GOWN STRL REUS W/TWL XL LVL3 (GOWN DISPOSABLE) ×3
KIT TURNOVER CYSTO (KITS) ×2 IMPLANT
MANIFOLD NEPTUNE II (INSTRUMENTS) ×3 IMPLANT
PACK CYSTO (CUSTOM PROCEDURE TRAY) ×3 IMPLANT
PAD ARMBOARD 7.5X6 YLW CONV (MISCELLANEOUS) ×3 IMPLANT
PLUG CATH AND CAP STER (CATHETERS) ×2 IMPLANT
WATER STERILE IRR 3000ML UROMA (IV SOLUTION) ×3 IMPLANT

## 2017-08-11 NOTE — Anesthesia Preprocedure Evaluation (Signed)
Anesthesia Evaluation  Patient identified by MRN, date of birth, ID band Patient awake    Reviewed: Allergy & Precautions, NPO status , Patient's Chart, lab work & pertinent test results  Airway Mallampati: II  TM Distance: >3 FB Neck ROM: Full    Dental no notable dental hx. (+) Teeth Intact   Pulmonary neg pulmonary ROS, shortness of breath and with exertion, sleep apnea ,  Denies use of CPAP in 2 years  States lost ~ 40 pounds   Pulmonary exam normal breath sounds clear to auscultation       Cardiovascular Exercise Tolerance: Poor hypertension, + Past MI and +CHF  negative cardio ROS Normal cardiovascular exam+ pacemaker II Rhythm:Regular Rate:Normal     Neuro/Psych negative neurological ROS  negative psych ROS   GI/Hepatic negative GI ROS, Neg liver ROS,   Endo/Other  negative endocrine ROSdiabetes  Renal/GU negative Renal ROS  negative genitourinary   Musculoskeletal negative musculoskeletal ROS (+) Arthritis , Osteoarthritis,    Abdominal   Peds negative pediatric ROS (+)  Hematology negative hematology ROS (+)   Anesthesia Other Findings   Reproductive/Obstetrics negative OB ROS                             Anesthesia Physical Anesthesia Plan  ASA: III  Anesthesia Plan: General   Post-op Pain Management:    Induction: Intravenous  PONV Risk Score and Plan: Ondansetron and Dexamethasone  Airway Management Planned: LMA  Additional Equipment:   Intra-op Plan:   Post-operative Plan: Extubation in OR  Informed Consent: I have reviewed the patients History and Physical, chart, labs and discussed the procedure including the risks, benefits and alternatives for the proposed anesthesia with the patient or authorized representative who has indicated his/her understanding and acceptance.   Dental advisory given  Plan Discussed with: CRNA  Anesthesia Plan Comments:          Anesthesia Quick Evaluation

## 2017-08-11 NOTE — Anesthesia Procedure Notes (Signed)
Procedure Name: LMA Insertion Date/Time: 08/11/2017 1:01 PM Performed by: Ollen Bowl, CRNA Pre-anesthesia Checklist: Patient identified, Patient being monitored, Emergency Drugs available, Timeout performed and Suction available Patient Re-evaluated:Patient Re-evaluated prior to induction Oxygen Delivery Method: Circle System Utilized Preoxygenation: Pre-oxygenation with 100% oxygen Induction Type: IV induction Ventilation: Mask ventilation without difficulty LMA: LMA inserted LMA Size: 4.0 Number of attempts: 1 Placement Confirmation: positive ETCO2 and breath sounds checked- equal and bilateral

## 2017-08-11 NOTE — Op Note (Signed)
Preoperative diagnosis: BPH with gross hematuria  Postoperative diagnosis: same  Procedure: 1 cystoscopy 2. fulgeration of prostatic varices  Attending: Nicolette Bang  Anesthesia: General  Estimated blood loss: Minimal  Drains: 22 French foley  Specimens: none  Antibiotics: ancef  Findings: bilobar prostate enlargement with multiple varices extending into the bladder Ureteral orifices in normal anatomic location.   Indications: Patient is a 84 year old male with a history of BPH and gross hematuria with varices found on office cystoscopy.  After discussing treatment options, they decided proceed with fulgeration of the varices.  Procedure her in detail: The patient was brought to the operating room and a brief timeout was done to ensure correct patient, correct procedure, correct site.  General anesthesia was administered patient was placed in dorsal lithotomy position.  Their genitalia was then prepped and draped in usual sterile fashion.  A rigid 27 French cystoscope was passed in the urethra and the bladder.  Bladder was inspected and we noted no masses or lesions.  the ureteral orifices were in the normal orthotopic locations.  Using the bugbee with fulgerated the varices and no residual bleeding was encountered.  the bladder was then drained, a 22 French foley was placed and this concluded the procedure which was well tolerated by patient.  Complications: None  Condition: Stable, extubated, transferred to PACU  Plan: Patient is to be discharged home and followup in 2 days for foley catheter removal.

## 2017-08-11 NOTE — H&P (Signed)
Urology Admission H&P  Chief Complaint: gross hematuria  History of Present Illness: Anthony Lucas is a 84yo with a hx of BPh and gross hematuria here for fulgeration of prostatic varices. He has had gross hematuria for the past month. No other LUTS. No fevers/chills/sweats  Past Medical History:  Diagnosis Date  . Arthritis    "right knee" (12/19/2015)  . CHF (congestive heart failure) ([place])    Abnormal echo January 2017  . History of colon polyps   . History of gout   . Hyperlipidemia   . Hypertension   . Myocardial infarction [facility])    "they said I'd had a heart attack but didn't know it" (12/19/2015)  . Presence of permanent cardiac pacemaker   . Seasonal allergies   . Skin cancer    "arms/hands" (12/19/2015)  . Sleep apnea    supposed to wear CPAP, but doesn't (12/19/2015)  . Type II diabetes mellitus ([place])    Past Surgical History:  Procedure Laterality Date  . CATARACT EXTRACTION W/PHACO Right 09/08/2012   Procedure: CATARACT EXTRACTION PHACO AND INTRAOCULAR LENS PLACEMENT (IOC);  Surgeon: Tonny Branch, MD;  Location: [facility];  Service: Ophthalmology;  Laterality: Right;  CDE: 21.28  . CATARACT EXTRACTION W/PHACO Left 12/08/2012   Procedure: CATARACT EXTRACTION PHACO AND INTRAOCULAR LENS PLACEMENT (IOC);  Surgeon: Tonny Branch, MD;  Location: [facility];  Service: Ophthalmology;  Laterality: Left;  CDE:25.72  . [place]  . COLONOSCOPY     4 procedures  . COLONOSCOPY  2012   Dr. Sharlett Iles: diverticulosis, lipoma in ascending colon.   . EP IMPLANTABLE DEVICE N/A 12/19/2015   Procedure: BiV Pacemaker Insertion CRT-P;  Surgeon: Evans Lance, MD;  Location: [facility];  Service: Cardiovascular;  Laterality: N/A;  . INSERT / REPLACE / REMOVE PACEMAKER  12/19/2015  . JOINT REPLACEMENT Left    knee  . POLYPECTOMY    . TONSILLECTOMY    . TOTAL KNEE ARTHROPLASTY Left 2000s  . TRANSURETHRAL RESECTION OF PROSTATE    . YAG LASER APPLICATION Right 07/29/9561   Procedure:  YAG LASER APPLICATION;  Surgeon: [name], MD;  Location: [facility];  Service: Ophthalmology;  Laterality: Right;  . YAG LASER APPLICATION Left 8/75/6433   Procedure: YAG LASER APPLICATION;  Surgeon: [name], MD;  Location: [facility];  Service: Ophthalmology;  Laterality: Left;    Home Medications:  Current Facility-Administered Medications  Medication Dose Route Frequency Provider Last Rate Last Dose  . ceFAZolin (ANCEF) IVPB 2g/100 mL premix  2 g Intravenous 30 min Pre-Op Mayumi Summerson, Candee Furbish, MD      . lactated ringers infusion   Intravenous Continuous Lenice Llamas, MD 50 mL/hr at 08/11/17 1118     Allergies:  Allergies  Allergen Reactions  . Bee Venom Anaphylaxis    Honey bee     Family History  Problem Relation Age of Onset  . Lung cancer Father   . Colon cancer Neg Hx    Social History:  reports that he has never smoked. He has never used smokeless tobacco. He reports that he drinks about 1.2 oz of alcohol per week. He reports that he does not use drugs.  Review of Systems  Genitourinary: Positive for hematuria.  All other systems reviewed and are negative.   Physical Exam:  Vital signs in last 24 hours: Temp:  [98.4 F (36.9 C)-98.5 F (36.9 C)] 98.5 F (36.9 C) (05/08 1115) Pulse Rate:  [62-69] 62 (05/08 1115) Resp:  [16-17] 17 (  05/08 1115) BP: (130-138)/(67-72) 138/72 (05/08 1115) SpO2:  [98 %] 98 % (05/08 1115) Weight:  [92.5 kg (204 lb)] 92.5 kg (204 lb) (05/07 1522) Physical Exam  Constitutional: He is oriented to person, place, and time. He appears well-developed and well-nourished.  HENT:  Head: Normocephalic and atraumatic.  Eyes: Pupils are equal, round, and reactive to light. EOM are normal.  Neck: Normal range of motion. No thyromegaly present.  Cardiovascular: Normal rate and regular rhythm.  Respiratory: Effort normal. No respiratory distress.  GI: Soft. He exhibits no distension.  Musculoskeletal: Normal range of motion. He exhibits no  edema.  Neurological: He is alert and oriented to person, place, and time.  Skin: Skin is warm and dry.  Psychiatric: He has a normal mood and affect. His behavior is normal. Judgment and thought content normal.    Laboratory Data:  Results for orders placed or performed during the hospital encounter of 08/11/17 (from the past 24 hour(s))  Glucose, capillary     Status: Abnormal   Collection Time: 08/11/17 11:01 AM  Result Value Ref Range   Glucose-Capillary 106 (H) 65 - 99 mg/dL   No results found for this or any previous visit (from the past 240 hour(s)). Creatinine: Recent Labs    08/10/17 1518  CREATININE 1.05   Baseline Creatinine: 1  Impression/Assessment:  84yo with gross hematuria  Plan:  The risks/benefits/alternatives to fulgeration of prostatic varices was explained to the patient and he understands and wishes to proceed with surgery  Nicolette Bang 08/11/2017, 12:40 PM

## 2017-08-11 NOTE — Transfer of Care (Signed)
Immediate Anesthesia Transfer of Care Note  Patient: Anthony Lucas  Procedure(s) Performed: CYSTOSCOPY WITH FULGERATION OF PROSTATIC VARICES (N/A Penis)  Patient Location: PACU  Anesthesia Type:General  Level of Consciousness: awake, alert  and oriented  Airway & Oxygen Therapy: Patient Spontanous Breathing  Post-op Assessment: Report given to RN  Post vital signs: Reviewed and stable  Last Vitals:  Vitals Value Taken Time  BP 144/77 08/11/2017  1:49 PM  Temp    Pulse 56 08/11/2017  1:52 PM  Resp 10 08/11/2017  1:52 PM  SpO2 95 % 08/11/2017  1:52 PM  Vitals shown include unvalidated device data.  Last Pain:  Vitals:   08/11/17 1115  TempSrc: Oral  PainSc: 0-No pain         Complications: No apparent anesthesia complications

## 2017-08-11 NOTE — Discharge Instructions (Signed)
Indwelling Urinary Catheter Care, Adult  Take good care of your catheter to keep it working and to prevent problems.  How to wear your catheter  Attach your catheter to your leg with tape (adhesive tape) or a leg strap. Make sure it is not too tight. If you use tape, remove any bits of tape that are already on the catheter.  How to wear a drainage bag  You should have:   A large overnight bag.   A small leg bag.    Overnight Bag  You may wear the overnight bag at any time. Always keep the bag below the level of your bladder but off the floor. When you sleep, put a clean plastic bag in a wastebasket. Then hang the bag inside the wastebasket.  Leg Bag  Never wear the leg bag at night. Always wear the leg bag below your knee. Keep the leg bag secure with a leg strap or tape.  How to care for your skin   Clean the skin around the catheter at least once every day.   Shower every day. Do not take baths.   Put creams, lotions, or ointments on your genital area only as told by your doctor.   Do not use powders, sprays, or lotions on your genital area.  How to clean your catheter and your skin  1. Wash your hands with soap and water.  2. Wet a washcloth in warm water and gentle (mild) soap.  3. Use the washcloth to clean the skin where the catheter enters your body. Clean downward and wipe away from the catheter in small circles. Do not wipe toward the catheter.  4. Pat the area dry with a clean towel. Make sure to clean off all soap.  How to care for your drainage bags  Empty your drainage bag when it is ?- full or at least 2-3 times a day. Replace your drainage bag once a month or sooner if it starts to smell bad or look dirty. Do not clean your drainage bag unless told by your doctor.  Emptying a drainage bag    Supplies Needed   Rubbing alcohol.   Gauze pad or cotton ball.   Tape or a leg strap.    Steps  1. Wash your hands with soap and water.  2. Separate (detach) the bag from your leg.  3. Hold the bag over  the toilet or a clean container. Keep the bag below your hips and bladder. This stops pee (urine) from going back into the tube.  4. Open the pour spout at the bottom of the bag.  5. Empty the pee into the toilet or container. Do not let the pour spout touch any surface.  6. Put rubbing alcohol on a gauze pad or cotton ball.  7. Use the gauze pad or cotton ball to clean the pour spout.  8. Close the pour spout.  9. Attach the bag to your leg with tape or a leg strap.  10. Wash your hands.    Changing a drainage bag  Supplies Needed   Alcohol wipes.   A clean drainage bag.   Adhesive tape or a leg strap.    Steps  1. Wash your hands with soap and water.  2. Separate the dirty bag from your leg.  3. Pinch the rubber catheter with your fingers so that pee does not spill out.  4. Separate the catheter tube from the drainage tube where these tubes connect (at the   connection valve). Do not let the tubes touch any surface.  5. Clean the end of the catheter tube with an alcohol wipe. Use a different alcohol wipe to clean the end of the drainage tube.  6. Connect the catheter tube to the drainage tube of the clean bag.  7. Attach the new bag to the leg with adhesive tape or a leg strap.  8. Wash your hands.    How to prevent infection and other problems   Never pull on your catheter or try to remove it. Pulling can damage tissue in your body.   Always wash your hands before and after touching your catheter.   If a leg strap gets wet, replace it with a dry one.   Drink enough fluids to keep your pee clear or pale yellow, or as told by your doctor.   Do not let the drainage bag or tubing touch the floor.   Wear cotton underwear.   If you are male, wipe from front to back after you poop (have a bowel movement).   Check on the catheter often to make sure it works and the tubing is not twisted.  Get help if:   Your pee is cloudy.   Your pee smells unusually bad.   Your pee is not draining into the bag.   Your  tube gets clogged.   Your catheter starts to leak.   Your bladder feels full.  Get help right away if:   You have redness, swelling, or pain where the catheter enters your body.   You have fluid, pus, or a bad smell coming from the area where the catheter enters your body.   The area where the catheter enters your body feels warm.   You have a fever.   You have pain in your:  ? Stomach (abdomen).  ? Legs.  ? Lower back.  ? Bladder.   You see blood fill the catheter.   Your pee is pink or red.   You feel sick to your stomach (nauseous).   You throw up (vomit).   You have chills.   Your catheter gets pulled out.  This information is not intended to replace advice given to you by your health care provider. Make sure you discuss any questions you have with your health care provider.  Document Released: 07/18/2012 Document Revised: 02/19/2016 Document Reviewed: 09/05/2013  [employer]vier Interactive Patient Education  2018 [employer]

## 2017-08-11 NOTE — Anesthesia Postprocedure Evaluation (Signed)
Anesthesia Post Note  Patient: LINDON KIEL  Procedure(s) Performed: CYSTOSCOPY WITH FULGERATION OF PROSTATIC VARICES (N/A Penis)  Patient location during evaluation: PACU Anesthesia Type: General Level of consciousness: awake and alert and oriented Pain management: pain level controlled Vital Signs Assessment: post-procedure vital signs reviewed and stable Respiratory status: spontaneous breathing Cardiovascular status: blood pressure returned to baseline and stable Postop Assessment: no apparent nausea or vomiting Anesthetic complications: no     Last Vitals:  Vitals:   08/11/17 1340 08/11/17 1400  BP: (!) 144/77 (!) 145/72  Pulse: 76 (!) 47  Resp: 11 (!) 9  Temp: 37 C   SpO2: 95% 95%    Last Pain:  Vitals:   08/11/17 1400  TempSrc:   PainSc: 0-No pain                 Jaquavious Mercer

## 2017-08-12 ENCOUNTER — Encounter (HOSPITAL_COMMUNITY): Payer: Self-pay | Admitting: Urology

## 2017-08-13 ENCOUNTER — Ambulatory Visit: Payer: Medicare Other | Admitting: Urology

## 2017-08-16 ENCOUNTER — Other Ambulatory Visit: Payer: Self-pay | Admitting: *Deleted

## 2017-08-16 MED ORDER — SACUBITRIL-VALSARTAN 24-26 MG PO TABS: ORAL_TABLET | ORAL | 6 refills | Status: DC

## 2017-08-18 ENCOUNTER — Ambulatory Visit (INDEPENDENT_AMBULATORY_CARE_PROVIDER_SITE_OTHER): Payer: Medicare Other | Admitting: Urology

## 2017-08-18 DIAGNOSIS — R310 Gross hematuria: Secondary | ICD-10-CM

## 2017-08-20 ENCOUNTER — Other Ambulatory Visit (HOSPITAL_COMMUNITY)
Admission: RE | Admit: 2017-08-20 | Discharge: 2017-08-20 | Disposition: A | Discharge status: Home / Self Care | Payer: Medicare Other | Source: Ambulatory Visit | Attending: Urology | Admitting: Urology

## 2017-08-20 ENCOUNTER — Ambulatory Visit (INDEPENDENT_AMBULATORY_CARE_PROVIDER_SITE_OTHER): Payer: Medicare Other | Admitting: Urology

## 2017-08-20 DIAGNOSIS — R310 Gross hematuria: Secondary | ICD-10-CM

## 2017-08-20 DIAGNOSIS — N390 Urinary tract infection, site not specified: Secondary | ICD-10-CM | POA: Diagnosis not present

## 2017-08-23 LAB — URINE CULTURE: Culture: >=100000 — AB

## 2017-09-06 ENCOUNTER — Encounter: Payer: Self-pay | Admitting: Family Medicine

## 2017-09-06 ENCOUNTER — Ambulatory Visit (INDEPENDENT_AMBULATORY_CARE_PROVIDER_SITE_OTHER): Payer: Medicare Other | Admitting: Family Medicine

## 2017-09-06 VITALS — Temp 98.0°F | Ht 73.0 in | Wt 203.0 lb

## 2017-09-06 DIAGNOSIS — I25118 Atherosclerotic heart disease of native coronary artery with other forms of angina pectoris: Secondary | ICD-10-CM

## 2017-09-06 DIAGNOSIS — J019 Acute sinusitis, unspecified: Secondary | ICD-10-CM | POA: Diagnosis not present

## 2017-09-06 DIAGNOSIS — I10 Essential (primary) hypertension: Secondary | ICD-10-CM | POA: Diagnosis not present

## 2017-09-06 MED ORDER — AMOXICILLIN 500 MG PO TABS: 500.0000 mg | ORAL_TABLET | Freq: Three times a day (TID) | ORAL | 0 refills | Status: DC

## 2017-09-06 NOTE — Progress Notes (Signed)
   Subjective:    Patient ID: Anthony Lucas, male    DOB: 11/06/1930, 84 y.o.   MRN: [id number]  Cough  This is a new problem. The current episode started in the past 7 days. Associated symptoms include nasal congestion and rhinorrhea. Pertinent negatives include no chest pain, chills, ear pain, fever or wheezing. Treatments tried: lozenges.   Significant head congestion drainage coughing no high fever chills wheezing or vomiting   Review of Systems  Constitutional: Negative for activity change, chills and fever.  HENT: Positive for congestion and rhinorrhea. Negative for ear pain.   Eyes: Negative for discharge.  Respiratory: Positive for cough. Negative for wheezing.   Cardiovascular: Negative for chest pain.  Gastrointestinal: Negative for nausea and vomiting.  Musculoskeletal: Negative for arthralgias.       Objective:   Physical Exam  Constitutional: He appears well-developed.  HENT:  Head: Normocephalic.  Mouth/Throat: Oropharynx is clear and moist. No oropharyngeal exudate.  Neck: Normal range of motion.  Cardiovascular: Normal rate, regular rhythm and normal heart sounds.  No murmur heard. Pulmonary/Chest: Effort normal and breath sounds normal. He has no wheezes.  Lymphadenopathy:    He has no cervical adenopathy.  Neurological: He exhibits normal muscle tone.  Skin: Skin is warm and dry.  Nursing note and vitals reviewed.         Assessment & Plan:  Significant head congestion Sinus Prescribed Warnings discussed No pneumonia need to follow-up if problems  Lab work ordered for medication check in July

## 2017-09-10 ENCOUNTER — Other Ambulatory Visit: Payer: Self-pay | Admitting: Family Medicine

## 2017-09-15 DIAGNOSIS — I10 Essential (primary) hypertension: Secondary | ICD-10-CM | POA: Diagnosis not present

## 2017-09-16 LAB — HEPATIC FUNCTION PANEL
ALT: 11 IU/L (ref 0–44)
AST: 11 IU/L (ref 0–40)
Albumin: 4 g/dL (ref 3.5–4.7)
Alkaline Phosphatase: 53 IU/L (ref 39–117)
Bilirubin Total: 0.5 mg/dL (ref 0.0–1.2)
Bilirubin, Direct: 0.16 mg/dL (ref 0.00–0.40)
Total Protein: 6.5 g/dL (ref 6.0–8.5)

## 2017-09-16 LAB — LIPID PANEL
Chol/HDL Ratio: 2.8 ratio (ref 0.0–5.0)
Cholesterol, Total: 142 mg/dL (ref 100–199)
HDL: 51 mg/dL (ref >39)
LDL Calculated: 74 mg/dL (ref 0–99)
Triglycerides: 84 mg/dL (ref 0–149)
VLDL CHOLESTEROL CAL: 17 mg/dL (ref 5–40)

## 2017-09-22 ENCOUNTER — Encounter: Payer: Self-pay | Admitting: Family Medicine

## 2017-09-22 ENCOUNTER — Ambulatory Visit (INDEPENDENT_AMBULATORY_CARE_PROVIDER_SITE_OTHER): Payer: Medicare Other | Admitting: Family Medicine

## 2017-09-22 VITALS — BP 130/74 | Ht 73.0 in | Wt 202.0 lb

## 2017-09-22 DIAGNOSIS — I5022 Chronic systolic (congestive) heart failure: Secondary | ICD-10-CM | POA: Diagnosis not present

## 2017-09-22 DIAGNOSIS — M1000 Idiopathic gout, unspecified site: Secondary | ICD-10-CM

## 2017-09-22 DIAGNOSIS — I25118 Atherosclerotic heart disease of native coronary artery with other forms of angina pectoris: Secondary | ICD-10-CM

## 2017-09-22 DIAGNOSIS — E1169 Type 2 diabetes mellitus with other specified complication: Secondary | ICD-10-CM | POA: Diagnosis not present

## 2017-09-22 DIAGNOSIS — I10 Essential (primary) hypertension: Secondary | ICD-10-CM

## 2017-09-22 MED ORDER — METFORMIN HCL 500 MG PO TABS: ORAL_TABLET | ORAL | 5 refills | Status: DC

## 2017-09-22 MED ORDER — SIMVASTATIN 40 MG PO TABS: ORAL_TABLET | ORAL | 5 refills | Status: DC

## 2017-09-22 MED ORDER — FUROSEMIDE 20 MG PO TABS: ORAL_TABLET | ORAL | 5 refills | Status: DC

## 2017-09-22 MED ORDER — POTASSIUM CHLORIDE ER 10 MEQ PO TBCR: EXTENDED_RELEASE_TABLET | ORAL | 5 refills | Status: DC

## 2017-09-22 NOTE — Progress Notes (Signed)
   Subjective:    Patient ID: Anthony Lucas, male    DOB: 26-Dec-1930, 84 y.o.   MRN: [id number]  HPI Follow up bloodwork results. Pt states no concerns today.  Patient denies any particular troubles currently had recent lab work done which look good Takes his medicine on a regular basis Tries to eat healthy Recently lost his wifeHe is having some sadness with it but denies any depression  Patient for blood pressure check up. Patient relates compliance with meds. Todays BP reviewed with the patient. Patient denies issues with medication. Patient relates reasonable diet. Patient tries to minimize salt. Patient aware of BP goals.  The patient was seen today as part of a comprehensive diabetic check up.The patient relates medication compliance. No significant side effects to the medications. Denies any low glucose spells. Relates compliance with diet to a reasonable level. Patient does do labwork intermittently and understands the dangers of diabetes.   Patient here for follow-up regarding cholesterol.  Patient does try to maintain a reasonable diet.  Patient does take the medication on a regular basis.  Denies missing a dose.  The patient denies any obvious side effects.  Prior blood work results reviewed with the patient.  The patient is aware of his cholesterol goals and the need to keep it under good control to lessen the risk of disease.  chf stable takes his meds  Review of Systems  Constitutional: Negative for activity change, appetite change and fatigue.  HENT: Negative for congestion and rhinorrhea.   Respiratory: Negative for cough, chest tightness and shortness of breath.   Cardiovascular: Negative for chest pain and leg swelling.  Gastrointestinal: Negative for abdominal pain, diarrhea and nausea.  Endocrine: Negative for polydipsia and polyphagia.  Genitourinary: Negative for dysuria and hematuria.  Neurological: Negative for weakness and headaches.  Psychiatric/Behavioral:  Negative for confusion and dysphoric mood.       Objective:   Physical Exam  Constitutional: He appears well-nourished. No distress.  Cardiovascular: Normal rate, regular rhythm and normal heart sounds.  No murmur heard. Pulmonary/Chest: Effort normal and breath sounds normal. No respiratory distress.  Musculoskeletal: He exhibits no edema.  Lymphadenopathy:    He has no cervical adenopathy.  Neurological: He is alert.  Psychiatric: His behavior is normal.  Vitals reviewed.  Patient has significant neuropathy in his feet also has some deformity as well as calluses he does see podiatry does get diabetic shoes       Assessment & Plan:  Diabetes good control watch diet continue current measures check labs again in 6 months  Blood pressure good control continue current measures  CHF stable no sign of any stability at this point continue current medications  Diabetic neuropathy pain is under good control does benefit from diabetic shoes  Intermittent gout no flareups currently  25 minutes was spent with the patient.  This statement verifies that 25 minutes was indeed spent with the patient. Greater than half the time was spent in discussion, counseling and answering questions  regarding the issues that the patient came in for today as reflected in the diagnosis (s) please refer to documentation for further details. Greater than half spent discussing multiple different health issues  Lipidemia continue current medication watch diet previous labs reviewed

## 2017-09-29 ENCOUNTER — Ambulatory Visit (INDEPENDENT_AMBULATORY_CARE_PROVIDER_SITE_OTHER): Payer: Medicare Other | Admitting: Urology

## 2017-09-29 DIAGNOSIS — R310 Gross hematuria: Secondary | ICD-10-CM

## 2017-09-29 DIAGNOSIS — N401 Enlarged prostate with lower urinary tract symptoms: Secondary | ICD-10-CM | POA: Diagnosis not present

## 2017-10-05 ENCOUNTER — Other Ambulatory Visit: Payer: Self-pay | Admitting: Family Medicine

## 2017-10-05 DIAGNOSIS — B351 Tinea unguium: Secondary | ICD-10-CM | POA: Diagnosis not present

## 2017-10-05 DIAGNOSIS — E1142 Type 2 diabetes mellitus with diabetic polyneuropathy: Secondary | ICD-10-CM | POA: Diagnosis not present

## 2017-10-18 ENCOUNTER — Ambulatory Visit (INDEPENDENT_AMBULATORY_CARE_PROVIDER_SITE_OTHER): Payer: Medicare Other | Admitting: *Deleted

## 2017-10-18 DIAGNOSIS — I429 Cardiomyopathy, unspecified: Secondary | ICD-10-CM

## 2017-10-18 NOTE — Progress Notes (Signed)
Remote pacemaker transmission.   

## 2017-10-19 DIAGNOSIS — D1724 Benign lipomatous neoplasm of skin and subcutaneous tissue of left leg: Secondary | ICD-10-CM | POA: Diagnosis not present

## 2017-10-19 DIAGNOSIS — Z85828 Personal history of other malignant neoplasm of skin: Secondary | ICD-10-CM | POA: Diagnosis not present

## 2017-10-19 DIAGNOSIS — D0461 Carcinoma in situ of skin of right upper limb, including shoulder: Secondary | ICD-10-CM | POA: Diagnosis not present

## 2017-10-19 DIAGNOSIS — L570 Actinic keratosis: Secondary | ICD-10-CM | POA: Diagnosis not present

## 2017-10-19 DIAGNOSIS — D2372 Other benign neoplasm of skin of left lower limb, including hip: Secondary | ICD-10-CM | POA: Diagnosis not present

## 2017-10-19 DIAGNOSIS — X32XXXD Exposure to sunlight, subsequent encounter: Secondary | ICD-10-CM | POA: Diagnosis not present

## 2017-10-19 DIAGNOSIS — Z08 Encounter for follow-up examination after completed treatment for malignant neoplasm: Secondary | ICD-10-CM | POA: Diagnosis not present

## 2017-10-19 LAB — CUP PACEART REMOTE DEVICE CHECK
Battery Voltage: 2.95 V
Brady Statistic AP VP Percent: 6.96 %
Brady Statistic AS VP Percent: 80.11 %
Brady Statistic AS VS Percent: 12.75 %
Brady Statistic RV Percent Paced: 10.5 %
Date Time Interrogation Session: 20190715043153
Implantable Lead Implant Date: 20170914
Implantable Lead Location: 753859
Implantable Lead Location: 753860
Implantable Lead Model: 4598
Implantable Lead Model: 5076
Implantable Pulse Generator Implant Date: 20170914
Lead Channel Impedance Value: 399 Ohm
Lead Channel Impedance Value: 399 Ohm
Lead Channel Impedance Value: 456 Ohm
Lead Channel Impedance Value: 475 Ohm
Lead Channel Impedance Value: 475 Ohm
Lead Channel Impedance Value: 646 Ohm
Lead Channel Impedance Value: 684 Ohm
Lead Channel Impedance Value: 741 Ohm
Lead Channel Pacing Threshold Amplitude: 0.5 V
Lead Channel Pacing Threshold Amplitude: 0.625 V
Lead Channel Pacing Threshold Amplitude: 3.5 V
Lead Channel Pacing Threshold Pulse Width: 0.4 ms
Lead Channel Pacing Threshold Pulse Width: 1 ms
Lead Channel Sensing Intrinsic Amplitude: 10.75 mV
Lead Channel Sensing Intrinsic Amplitude: 2 mV
Lead Channel Sensing Intrinsic Amplitude: 2 mV
Lead Channel Setting Pacing Amplitude: 2 V
Lead Channel Setting Pacing Amplitude: 2.5 V
Lead Channel Setting Pacing Amplitude: 4 V
Lead Channel Setting Pacing Pulse Width: 0.4 ms
Lead Channel Setting Sensing Sensitivity: 2.8 mV
MDC IDC LEAD IMPLANT DT: 20170914
MDC IDC LEAD IMPLANT DT: 20170914
MDC IDC LEAD LOCATION: 753858
MDC IDC MSMT BATTERY REMAINING LONGEVITY: 47 mo
MDC IDC MSMT LEADCHNL LV IMPEDANCE VALUE: 361 Ohm
MDC IDC MSMT LEADCHNL LV IMPEDANCE VALUE: 627 Ohm
MDC IDC MSMT LEADCHNL LV IMPEDANCE VALUE: 741 Ohm
MDC IDC MSMT LEADCHNL RA IMPEDANCE VALUE: 323 Ohm
MDC IDC MSMT LEADCHNL RA IMPEDANCE VALUE: 456 Ohm
MDC IDC MSMT LEADCHNL RV IMPEDANCE VALUE: 418 Ohm
MDC IDC MSMT LEADCHNL RV PACING THRESHOLD PULSEWIDTH: 0.4 ms
MDC IDC MSMT LEADCHNL RV SENSING INTR AMPL: 10.75 mV
MDC IDC SET LEADCHNL LV PACING PULSEWIDTH: 1 ms
MDC IDC STAT BRADY AP VS PERCENT: 0.18 %
MDC IDC STAT BRADY RA PERCENT PACED: 8.86 %

## 2017-10-20 ENCOUNTER — Encounter: Payer: Self-pay | Admitting: Cardiology

## 2017-12-14 DIAGNOSIS — B351 Tinea unguium: Secondary | ICD-10-CM | POA: Diagnosis not present

## 2017-12-14 DIAGNOSIS — E1142 Type 2 diabetes mellitus with diabetic polyneuropathy: Secondary | ICD-10-CM | POA: Diagnosis not present

## 2018-01-17 ENCOUNTER — Ambulatory Visit (INDEPENDENT_AMBULATORY_CARE_PROVIDER_SITE_OTHER): Payer: Medicare Other | Admitting: *Deleted

## 2018-01-17 DIAGNOSIS — I429 Cardiomyopathy, unspecified: Secondary | ICD-10-CM | POA: Diagnosis not present

## 2018-01-17 DIAGNOSIS — I5022 Chronic systolic (congestive) heart failure: Secondary | ICD-10-CM

## 2018-01-18 NOTE — Progress Notes (Signed)
Remote pacemaker transmission.   

## 2018-01-20 ENCOUNTER — Encounter: Payer: Self-pay | Admitting: Cardiology

## 2018-01-21 ENCOUNTER — Ambulatory Visit: Payer: Medicare Other | Admitting: Family Medicine

## 2018-01-25 ENCOUNTER — Ambulatory Visit (INDEPENDENT_AMBULATORY_CARE_PROVIDER_SITE_OTHER): Payer: Medicare Other | Admitting: Family Medicine

## 2018-01-25 ENCOUNTER — Encounter: Payer: Self-pay | Admitting: Family Medicine

## 2018-01-25 VITALS — BP 110/62 | Ht 73.0 in | Wt 208.6 lb

## 2018-01-25 DIAGNOSIS — E7849 Other hyperlipidemia: Secondary | ICD-10-CM | POA: Diagnosis not present

## 2018-01-25 DIAGNOSIS — M109 Gout, unspecified: Secondary | ICD-10-CM | POA: Diagnosis not present

## 2018-01-25 DIAGNOSIS — E1169 Type 2 diabetes mellitus with other specified complication: Secondary | ICD-10-CM

## 2018-01-25 DIAGNOSIS — I10 Essential (primary) hypertension: Secondary | ICD-10-CM

## 2018-01-25 DIAGNOSIS — I25118 Atherosclerotic heart disease of native coronary artery with other forms of angina pectoris: Secondary | ICD-10-CM

## 2018-01-25 NOTE — Progress Notes (Signed)
Subjective:    Patient ID: Anthony Lucas, male    DOB: 1930-11-17, 84 y.o.   MRN: [id number]  Hypertension  This is a chronic problem. The current episode started more than 1 year ago. Pertinent negatives include no chest pain, headaches or shortness of breath. Risk factors for coronary artery disease include male gender and dyslipidemia. Treatments tried: lasix 20, toprolol 25. There are no compliance problems.    The patient was seen today as part of a comprehensive diabetic check up.the patient does have diabetes.  The patient follows here on a regular basis.  The patient relates medication compliance. No significant side effects to the medications. Denies any low glucose spells. Relates compliance with diet to a reasonable level. Patient does do labwork intermittently and understands the dangers of diabetes.   Patient here for follow-up regarding cholesterol.  The patient does have hyperlipidemia.  Patient does try to maintain a reasonable diet.  Patient does take the medication on a regular basis.  Denies missing a dose.  The patient denies any obvious side effects.  Prior blood work results reviewed with the patient.  The patient is aware of his cholesterol goals and the need to keep it under good control to lessen the risk of disease.  She does have a history of gout takes his medication has not had any problems with it Patient has underlying heart condition follows with cardiology no flareups of CHF recently  No problems or concerns  Review of Systems  Constitutional: Negative for activity change.  HENT: Negative for congestion and rhinorrhea.   Respiratory: Negative for cough and shortness of breath.   Cardiovascular: Negative for chest pain.  Gastrointestinal: Negative for abdominal pain, diarrhea, nausea and vomiting.  Genitourinary: Negative for dysuria and hematuria.  Neurological: Negative for weakness and headaches.  Psychiatric/Behavioral: Negative for behavioral problems  and confusion.       Objective:   Physical Exam  Constitutional: He appears well-nourished. No distress.  HENT:  Head: Normocephalic and atraumatic.  Eyes: Right eye exhibits no discharge. Left eye exhibits no discharge.  Neck: No tracheal deviation present.  Cardiovascular: Normal rate, regular rhythm and normal heart sounds.  No murmur heard. Pulmonary/Chest: Effort normal and breath sounds normal. No respiratory distress.  Musculoskeletal: He exhibits no edema.  Lymphadenopathy:    He has no cervical adenopathy.  Neurological: He is alert. Coordination normal.  Skin: Skin is warm and dry.  Psychiatric: He has a normal mood and affect. His behavior is normal.  Vitals reviewed.         Assessment & Plan:  HTN- Patient was seen today as part of a visit regarding hypertension. The importance of healthy diet and regular physical activity was discussed. The importance of compliance with medications discussed.  Ideal goal is to keep blood pressure low elevated levels certainly below 267/12 when possible.  The patient was counseled that keeping blood pressure under control lessen his risk of complications.  The importance of regular follow-ups was discussed with the patient.  Low-salt diet such as DASH recommended.  Regular physical activity was recommended as well.  Patient was advised to keep regular follow-ups.  The patient was seen today as part of a comprehensive visit for diabetes. The importance of keeping her A1c at or below 7 was discussed.  Importance of regular physical activity was discussed.   The importance of adherence to medication as well as a controlled low starch/sugar diet was also discussed.  Standard follow-up visit recommended.  Also patient aware failure to keep diabetes under control increases the risk of complications.  The patient was seen today as part of an evaluation regarding hyperlipidemia.  Recent lab work has been reviewed with the patient as  well as the goals for good cholesterol care.  In addition to this medications have been discussed the importance of compliance with diet and medications discussed as well.  Finally the patient is aware that poor control of cholesterol, noncompliance can dramatically increase the risk of complications. The patient will keep regular office visits and the patient does agreed to periodic lab work.  Gout-under good control takes medication tries watch diet  25 minutes was spent with the patient.  This statement verifies that 25 minutes was indeed spent with the patient.  More than 50% of this visit-total duration of the visit-was spent in counseling and coordination of care. The issues that the patient came in for today as reflected in the diagnosis (s) please refer to documentation for further details.

## 2018-01-26 DIAGNOSIS — I10 Essential (primary) hypertension: Secondary | ICD-10-CM | POA: Diagnosis not present

## 2018-01-26 DIAGNOSIS — E7849 Other hyperlipidemia: Secondary | ICD-10-CM | POA: Diagnosis not present

## 2018-01-26 DIAGNOSIS — E1169 Type 2 diabetes mellitus with other specified complication: Secondary | ICD-10-CM | POA: Diagnosis not present

## 2018-01-26 DIAGNOSIS — M109 Gout, unspecified: Secondary | ICD-10-CM | POA: Diagnosis not present

## 2018-01-27 ENCOUNTER — Encounter: Payer: Self-pay | Admitting: Family Medicine

## 2018-01-27 LAB — LIPID PANEL
CHOL/HDL RATIO: 2.8 ratio (ref 0.0–5.0)
Cholesterol, Total: 148 mg/dL (ref 100–199)
HDL: 52 mg/dL (ref >39)
LDL CALC: 75 mg/dL (ref 0–99)
Triglycerides: 103 mg/dL (ref 0–149)
VLDL Cholesterol Cal: 21 mg/dL (ref 5–40)

## 2018-01-27 LAB — MICROALBUMIN / CREATININE URINE RATIO
CREATININE, UR: 85.2 mg/dL
MICROALBUM., U, RANDOM: 49.2 ug/mL
Microalb/Creat Ratio: 57.7 mg/g creat — ABNORMAL HIGH (ref 0.0–30.0)

## 2018-01-27 LAB — BASIC METABOLIC PANEL
BUN/Creatinine Ratio: 14 (ref 10–24)
BUN: 15 mg/dL (ref 8–27)
CO2: 25 mmol/L (ref 20–29)
CREATININE: 1.06 mg/dL (ref 0.76–1.27)
Calcium: 9.1 mg/dL (ref 8.6–10.2)
Chloride: 103 mmol/L (ref 96–106)
GFR calc Af Amer: 74 mL/min/{1.73_m2} (ref >59)
GFR calc non Af Amer: 64 mL/min/{1.73_m2} (ref >59)
GLUCOSE: 120 mg/dL — AB (ref 65–99)
POTASSIUM: 4.6 mmol/L (ref 3.5–5.2)
SODIUM: 142 mmol/L (ref 134–144)

## 2018-01-27 LAB — URIC ACID: URIC ACID: 4.7 mg/dL (ref 3.7–8.6)

## 2018-01-27 LAB — HEPATIC FUNCTION PANEL
ALK PHOS: 53 IU/L (ref 39–117)
ALT: 14 IU/L (ref 0–44)
AST: 14 IU/L (ref 0–40)
Albumin: 4 g/dL (ref 3.5–4.7)
BILIRUBIN, DIRECT: 0.12 mg/dL (ref 0.00–0.40)
Bilirubin Total: 0.3 mg/dL (ref 0.0–1.2)
TOTAL PROTEIN: 6.6 g/dL (ref 6.0–8.5)

## 2018-01-27 LAB — HEMOGLOBIN A1C
Est. average glucose Bld gHb Est-mCnc: 140 mg/dL
Hgb A1c MFr Bld: 6.5 % — ABNORMAL HIGH (ref 4.8–5.6)

## 2018-02-07 ENCOUNTER — Encounter: Payer: Self-pay | Admitting: Cardiovascular Disease

## 2018-02-07 ENCOUNTER — Ambulatory Visit (INDEPENDENT_AMBULATORY_CARE_PROVIDER_SITE_OTHER): Payer: Medicare Other | Admitting: Cardiovascular Disease

## 2018-02-07 VITALS — BP 142/76 | HR 60 | Ht 73.5 in | Wt 208.6 lb

## 2018-02-07 DIAGNOSIS — E785 Hyperlipidemia, unspecified: Secondary | ICD-10-CM | POA: Diagnosis not present

## 2018-02-07 DIAGNOSIS — I429 Cardiomyopathy, unspecified: Secondary | ICD-10-CM | POA: Diagnosis not present

## 2018-02-07 DIAGNOSIS — I25118 Atherosclerotic heart disease of native coronary artery with other forms of angina pectoris: Secondary | ICD-10-CM

## 2018-02-07 DIAGNOSIS — I519 Heart disease, unspecified: Secondary | ICD-10-CM

## 2018-02-07 DIAGNOSIS — I5022 Chronic systolic (congestive) heart failure: Secondary | ICD-10-CM

## 2018-02-07 DIAGNOSIS — Z950 Presence of cardiac pacemaker: Secondary | ICD-10-CM

## 2018-02-07 NOTE — Patient Instructions (Signed)
Medication Instructions:  Your physician recommends that you continue on your current medications as directed. Please refer to the Current Medication list given to you today.  If you need a refill on your cardiac medications before your next appointment, please call your pharmacy.   Lab work: NONE  If you have labs (blood work) drawn today and your tests are completely normal, you will receive your results only by: . MyChart Message (if you have MyChart) OR . A paper copy in the mail If you have any lab test that is abnormal or we need to change your treatment, we will call you to review the results.  Testing/Procedures: NONE   Follow-Up: At [facility], you and your health needs are our priority.  As part of our continuing mission to provide you with exceptional heart care, we have created designated Provider Care Teams.  These Care Teams include your primary Cardiologist (physician) and Advanced Practice Providers (APPs -  Physician Assistants and Nurse Practitioners) who all work together to provide you with the care you need, when you need it. You will need a follow up appointment in 1 years.  Please call our office 2 months in advance to schedule this appointment.  You may see No primary care provider on file. or one of the following Advanced Practice Providers on your designated Care Team:   Brittany Strader, PA-C ([place] Office) . Michele Lenze, PA-C ([place] Office)  Any Other Special Instructions Will Be Listed Below (If Applicable). Thank you for choosing [facility]!     

## 2018-02-07 NOTE — Progress Notes (Addendum)
SUBJECTIVE: The patient presents for follow-up of coronary artery disease, chronic systolic heart failure, severe left ventricular dysfunction, and biventricular pacemaker.  I reviewed most recent device interrogation available in the EMR dated 10/18/2017 which showed one 6 beat run of nonsustained ventricular tachycardia and one episode of SVT.  Optivol all levels were stable.  His wife of 49 years passed away earlier this year.  They had been high school sweetheart's since the ninth grade.  He has a daughter who is an Metallurgist at [employer] and plans to retire in the near future.  His granddaughter, Fonnie Jarvis, is an actress and lives in [place] and appeared in the movie "[employer]".  He denies chest pain, palpitations, dizziness, and leg swelling.  He has chronic exertional dyspnea which is stable.    Review of Systems: As per "subjective", otherwise negative.  Allergies  Allergen Reactions  . Bee Venom Anaphylaxis    Honey bee     Current Outpatient Medications  Medication Sig Dispense Refill  . acetaminophen (TYLENOL) 500 MG tablet Take 500 mg by mouth every 6 (six) hours as needed (for pain/headaches).    Marland Kitchen allopurinol (ZYLOPRIM) 100 MG tablet TAKE 2 TABLETS BY MOUTH DAILY FOR GOUT. 60 tablet 2  . aspirin EC 81 MG tablet Take 81 mg by mouth daily. Reported on 08/29/2015    . cetirizine (ZYRTEC) 10 MG tablet Take 10 mg by mouth daily as needed for allergies.    . finasteride (PROSCAR) 5 MG tablet TAKE ONE TABLET BY MOUTH ONCE DAILY PROSTATE. 30 tablet 5  . furosemide (LASIX) 20 MG tablet TAKE (1) TABLET BY MOUTH EACH MORNING FOR FLUID. 30 tablet 5  . metFORMIN (GLUCOPHAGE) 500 MG tablet TAKE ONE TABLET BY MOUTH DAILY FOR DIABETES. 30 tablet 5  . metoprolol succinate (TOPROL-XL) 25 MG 24 hr tablet TAKE 2 TABLETS BY MOUTH DAILY. 180 tablet 2  . potassium chloride (K-DUR) 10 MEQ tablet TAKE 2 TABLETS BY MOUTH DAILY FOR POTASSIUM REPLACEMENT. 60 tablet 5  .  sacubitril-valsartan (ENTRESTO) 24-26 MG TAKE ONE TABLET BY MOUTH 2 TIMES A DAY. 60 tablet 6  . simvastatin (ZOCOR) 40 MG tablet TAKE 1 TABLET BY MOUTH ONCE DAILY FOR CHOLESTEROL. 30 tablet 5  . traMADol (ULTRAM) 50 MG tablet Take 1 tablet (50 mg total) by mouth every 6 (six) hours as needed. 30 tablet 0   No current facility-administered medications for this visit.     Past Medical History:  Diagnosis Date  . Arthritis    "right knee" (12/19/2015)  . CHF (congestive heart failure) ([place])    Abnormal echo January 2017  . History of colon polyps   . History of gout   . Hyperlipidemia   . Hypertension   . Myocardial infarction [facility])    "they said I'd had a heart attack but didn't know it" (12/19/2015)  . Presence of permanent cardiac pacemaker   . Seasonal allergies   . Skin cancer    "arms/hands" (12/19/2015)  . Sleep apnea    supposed to wear CPAP, but doesn't (12/19/2015)  . Type II diabetes mellitus ([place])     Past Surgical History:  Procedure Laterality Date  . CATARACT EXTRACTION W/PHACO Right 09/08/2012   Procedure: CATARACT EXTRACTION PHACO AND INTRAOCULAR LENS PLACEMENT (IOC);  Surgeon: Tonny Branch, MD;  Location: [facility];  Service: Ophthalmology;  Laterality: Right;  CDE: 21.28  . CATARACT EXTRACTION W/PHACO Left 12/08/2012   Procedure: CATARACT EXTRACTION PHACO AND INTRAOCULAR LENS  PLACEMENT (IOC);  Surgeon: Tonny Branch, MD;  Location: [facility];  Service: Ophthalmology;  Laterality: Left;  CDE:25.72  . [place]  . COLONOSCOPY     4 procedures  . COLONOSCOPY  2012   Dr. Sharlett Iles: diverticulosis, lipoma in ascending colon.   Consuela Mimes WITH FULGERATION N/A 08/11/2017   Procedure: [place] OF PROSTATIC VARICES;  Surgeon: Cleon Gustin, MD;  Location: [facility];  Service: Urology;  Laterality: N/A;  . EP IMPLANTABLE DEVICE N/A 12/19/2015   Procedure: BiV Pacemaker Insertion CRT-P;  Surgeon: Evans Lance, MD;  Location: [facility];   Service: Cardiovascular;  Laterality: N/A;  . INSERT / REPLACE / REMOVE PACEMAKER  12/19/2015  . JOINT REPLACEMENT Left    knee  . POLYPECTOMY    . TONSILLECTOMY    . TOTAL KNEE ARTHROPLASTY Left 2000s  . TRANSURETHRAL RESECTION OF PROSTATE    . YAG LASER APPLICATION Right 09/30/348   Procedure: YAG LASER APPLICATION;  Surgeon: [name], MD;  Location: [facility];  Service: Ophthalmology;  Laterality: Right;  . YAG LASER APPLICATION Left 0/93/8182   Procedure: YAG LASER APPLICATION;  Surgeon: [name], MD;  Location: [facility];  Service: Ophthalmology;  Laterality: Left;    Social History   Socioeconomic History  . Marital status: Widowed    Spouse name: Not on file  . Number of children: Not on file  . Years of education: Not on file  . Highest education level: Not on file  Occupational History  . Not on file  Social Needs  . Financial resource strain: Not on file  . Food insecurity:    Worry: Not on file    Inability: Not on file  . Transportation needs:    Medical: Not on file    Non-medical: Not on file  Tobacco Use  . Smoking status: Never Smoker  . Smokeless tobacco: Never Used  Substance and Sexual Activity  . Alcohol use: Not Currently    Alcohol/week: 2.0 standard drinks    Types: 2 Glasses of wine per week  . Drug use: No  . Sexual activity: Not Currently    Birth control/protection: None  Lifestyle  . Physical activity:    Days per week: Not on file    Minutes per session: Not on file  . Stress: Not on file  Relationships  . Social connections:    Talks on phone: Not on file    Gets together: Not on file    Attends religious service: Not on file    Active member of club or organization: Not on file    Attends meetings of clubs or organizations: Not on file    Relationship status: Not on file  . Intimate partner violence:    Fear of current or ex partner: Not on file    Emotionally abused: Not on file    Physically abused: Not on file    Forced  sexual activity: Not on file  Other Topics Concern  . Not on file  Social History Narrative  . Not on file     Vitals:   02/07/18 0822  BP: (!) 142/76  Pulse: 60  SpO2: 98%  Weight: 208 lb 9.6 oz (94.6 kg)  Height: 6' 1.5" (1.867 m)    Wt Readings from Last 3 Encounters:  02/07/18 208 lb 9.6 oz (94.6 kg)  01/25/18 208 lb 9.6 oz (94.6 kg)  09/22/17 202 lb (91.6 kg)     PHYSICAL EXAM  General: NAD HEENT: Normal. Neck: No JVD, no thyromegaly. Lungs: Clear to auscultation bilaterally with normal respiratory effort. CV: Regular rate and rhythm, normal S1/S2, no S3/S4, no murmur. No pretibial or periankle edema.  No carotid bruit.   Abdomen: Soft, nontender, no distention.  Neurologic: Alert and oriented.  Psych: Normal affect. Skin: Normal. Musculoskeletal: No gross deformities.    ECG: Reviewed above under Subjective   Labs: Lab Results  Component Value Date/Time   K 4.6 01/26/2018 08:57 AM   BUN 15 01/26/2018 08:57 AM   CREATININE 1.06 01/26/2018 08:57 AM   CREATININE 1.17 (H) 12/12/2015 10:48 AM   ALT 14 01/26/2018 08:57 AM   TSH 1.960 04/26/2015 09:42 AM   HGB 12.7 (L) 08/10/2017 03:18 PM   HGB 13.5 09/11/2015 03:07 PM     Lipids: Lab Results  Component Value Date/Time   LDLCALC 75 01/26/2018 08:57 AM   CHOL 148 01/26/2018 08:57 AM   TRIG 103 01/26/2018 08:57 AM   HDL 52 01/26/2018 08:57 AM       ASSESSMENT AND PLAN:  1. Chronic systolic heart failure: He has severe left ventricular systolic dysfunction and a biventricular pacemaker: Stable NYHA class II symptoms. No changes to therapy. Continue Lasix, Toprol-XL, and Entresto.  2. Hypertension: Mildly elevated on present therapy. No changes.  3. Hyperlipidemia: Continue simvastatin 40 mg.  4. Coronary artery disease: Symptomatically stable.  Previous nuclear stress test demonstrated a moderate sized area of myocardial scar in the inferior and inferoseptal walls suggestive of prior myocardial  infarction.  There was no ischemia.  He denies anginal symptoms.  Continue aspirin (he takes it intermittently due to hematuria), beta blocker, and statin.    Disposition: Follow up 1 year   Kate Sable, M.D., F.A.C.C.

## 2018-02-11 LAB — CUP PACEART REMOTE DEVICE CHECK
Battery Remaining Longevity: 43 mo
Battery Voltage: 2.95 V
Brady Statistic AP VS Percent: 0.11 %
Brady Statistic AS VP Percent: 86.76 %
Brady Statistic AS VS Percent: 7.39 %
Date Time Interrogation Session: 20191014044202
Implantable Lead Implant Date: 20170914
Implantable Lead Implant Date: 20170914
Implantable Lead Location: 753858
Implantable Lead Location: 753859
Implantable Lead Model: 4598
Implantable Lead Model: 5076
Implantable Pulse Generator Implant Date: 20170914
Lead Channel Impedance Value: 361 Ohm
Lead Channel Impedance Value: 399 Ohm
Lead Channel Impedance Value: 418 Ohm
Lead Channel Impedance Value: 437 Ohm
Lead Channel Impedance Value: 532 Ohm
Lead Channel Impedance Value: 646 Ohm
Lead Channel Impedance Value: 684 Ohm
Lead Channel Impedance Value: 760 Ohm
Lead Channel Impedance Value: 817 Ohm
Lead Channel Pacing Threshold Amplitude: 0.5 V
Lead Channel Pacing Threshold Amplitude: 0.75 V
Lead Channel Pacing Threshold Pulse Width: 0.4 ms
Lead Channel Pacing Threshold Pulse Width: 1 ms
Lead Channel Setting Pacing Amplitude: 2 V
Lead Channel Setting Pacing Amplitude: 2.5 V
Lead Channel Setting Pacing Amplitude: 4 V
Lead Channel Setting Pacing Pulse Width: 1 ms
MDC IDC LEAD IMPLANT DT: 20170914
MDC IDC LEAD LOCATION: 753860
MDC IDC MSMT LEADCHNL LV IMPEDANCE VALUE: 475 Ohm
MDC IDC MSMT LEADCHNL LV IMPEDANCE VALUE: 494 Ohm
MDC IDC MSMT LEADCHNL LV IMPEDANCE VALUE: 741 Ohm
MDC IDC MSMT LEADCHNL LV PACING THRESHOLD AMPLITUDE: 4.25 V
MDC IDC MSMT LEADCHNL RA IMPEDANCE VALUE: 323 Ohm
MDC IDC MSMT LEADCHNL RA PACING THRESHOLD PULSEWIDTH: 0.4 ms
MDC IDC MSMT LEADCHNL RA SENSING INTR AMPL: 2.375 mV
MDC IDC MSMT LEADCHNL RA SENSING INTR AMPL: 2.375 mV
MDC IDC MSMT LEADCHNL RV IMPEDANCE VALUE: 494 Ohm
MDC IDC MSMT LEADCHNL RV SENSING INTR AMPL: 12.125 mV
MDC IDC MSMT LEADCHNL RV SENSING INTR AMPL: 12.125 mV
MDC IDC SET LEADCHNL RV PACING PULSEWIDTH: 0.4 ms
MDC IDC SET LEADCHNL RV SENSING SENSITIVITY: 2.8 mV
MDC IDC STAT BRADY AP VP PERCENT: 5.75 %
MDC IDC STAT BRADY RA PERCENT PACED: 7.45 %
MDC IDC STAT BRADY RV PERCENT PACED: 9.86 %

## 2018-02-16 ENCOUNTER — Other Ambulatory Visit (HOSPITAL_COMMUNITY)
Admission: RE | Admit: 2018-02-16 | Discharge: 2018-02-16 | Disposition: A | Discharge status: Home / Self Care | Payer: Medicare Other | Source: Other Acute Inpatient Hospital | Attending: Urology | Admitting: Urology

## 2018-02-16 DIAGNOSIS — R300 Dysuria: Secondary | ICD-10-CM | POA: Insufficient documentation

## 2018-02-17 LAB — URINE CULTURE

## 2018-02-18 ENCOUNTER — Other Ambulatory Visit: Payer: Self-pay

## 2018-02-22 DIAGNOSIS — B351 Tinea unguium: Secondary | ICD-10-CM | POA: Diagnosis not present

## 2018-02-22 DIAGNOSIS — E1142 Type 2 diabetes mellitus with diabetic polyneuropathy: Secondary | ICD-10-CM | POA: Diagnosis not present

## 2018-03-07 ENCOUNTER — Other Ambulatory Visit: Payer: Self-pay | Admitting: Family Medicine

## 2018-03-22 ENCOUNTER — Other Ambulatory Visit: Payer: Self-pay | Admitting: Cardiovascular Disease

## 2018-03-23 ENCOUNTER — Ambulatory Visit (INDEPENDENT_AMBULATORY_CARE_PROVIDER_SITE_OTHER): Payer: Medicare Other | Admitting: Urology

## 2018-03-23 DIAGNOSIS — R310 Gross hematuria: Secondary | ICD-10-CM | POA: Diagnosis not present

## 2018-03-23 DIAGNOSIS — N401 Enlarged prostate with lower urinary tract symptoms: Secondary | ICD-10-CM

## 2018-03-26 ENCOUNTER — Other Ambulatory Visit: Payer: Self-pay | Admitting: Internal Medicine

## 2018-03-26 ENCOUNTER — Other Ambulatory Visit: Payer: Self-pay | Admitting: Family Medicine

## 2018-04-18 ENCOUNTER — Ambulatory Visit (INDEPENDENT_AMBULATORY_CARE_PROVIDER_SITE_OTHER): Payer: Medicare Other

## 2018-04-18 DIAGNOSIS — I429 Cardiomyopathy, unspecified: Secondary | ICD-10-CM

## 2018-04-18 DIAGNOSIS — I5022 Chronic systolic (congestive) heart failure: Secondary | ICD-10-CM

## 2018-04-19 NOTE — Progress Notes (Signed)
Remote pacemaker transmission.   

## 2018-04-20 ENCOUNTER — Other Ambulatory Visit: Payer: Self-pay | Admitting: Family Medicine

## 2018-04-20 LAB — CUP PACEART REMOTE DEVICE CHECK
Battery Remaining Longevity: 42 mo
Battery Voltage: 2.94 V
Brady Statistic AS VS Percent: 5.34 %
Brady Statistic RA Percent Paced: 3.79 %
Implantable Lead Implant Date: 20170914
Implantable Lead Implant Date: 20170914
Implantable Lead Location: 753858
Implantable Lead Location: 753859
Implantable Lead Location: 753860
Implantable Lead Model: 4598
Implantable Lead Model: 5076
Implantable Lead Model: 5076
Lead Channel Impedance Value: 437 Ohm
Lead Channel Impedance Value: 494 Ohm
Lead Channel Impedance Value: 494 Ohm
Lead Channel Impedance Value: 494 Ohm
Lead Channel Impedance Value: 836 Ohm
Lead Channel Impedance Value: 836 Ohm
Lead Channel Pacing Threshold Amplitude: 0.5 V
Lead Channel Pacing Threshold Amplitude: 3.75 V
Lead Channel Pacing Threshold Pulse Width: 1 ms
Lead Channel Sensing Intrinsic Amplitude: 10.875 mV
Lead Channel Sensing Intrinsic Amplitude: 10.875 mV
Lead Channel Sensing Intrinsic Amplitude: 2 mV
Lead Channel Sensing Intrinsic Amplitude: 2 mV
Lead Channel Setting Pacing Amplitude: 2.5 V
Lead Channel Setting Pacing Amplitude: 4 V
MDC IDC LEAD IMPLANT DT: 20170914
MDC IDC MSMT LEADCHNL LV IMPEDANCE VALUE: 418 Ohm
MDC IDC MSMT LEADCHNL LV IMPEDANCE VALUE: 532 Ohm
MDC IDC MSMT LEADCHNL LV IMPEDANCE VALUE: 551 Ohm
MDC IDC MSMT LEADCHNL LV IMPEDANCE VALUE: 741 Ohm
MDC IDC MSMT LEADCHNL LV IMPEDANCE VALUE: 798 Ohm
MDC IDC MSMT LEADCHNL LV IMPEDANCE VALUE: 893 Ohm
MDC IDC MSMT LEADCHNL RA IMPEDANCE VALUE: 323 Ohm
MDC IDC MSMT LEADCHNL RA PACING THRESHOLD AMPLITUDE: 0.625 V
MDC IDC MSMT LEADCHNL RA PACING THRESHOLD PULSEWIDTH: 0.4 ms
MDC IDC MSMT LEADCHNL RV IMPEDANCE VALUE: 437 Ohm
MDC IDC MSMT LEADCHNL RV PACING THRESHOLD PULSEWIDTH: 0.4 ms
MDC IDC PG IMPLANT DT: 20170914
MDC IDC SESS DTM: 20200114145524
MDC IDC SET LEADCHNL LV PACING PULSEWIDTH: 1 ms
MDC IDC SET LEADCHNL RA PACING AMPLITUDE: 2 V
MDC IDC SET LEADCHNL RV PACING PULSEWIDTH: 0.4 ms
MDC IDC SET LEADCHNL RV SENSING SENSITIVITY: 2.8 mV
MDC IDC STAT BRADY AP VP PERCENT: 3 %
MDC IDC STAT BRADY AP VS PERCENT: 0.1 %
MDC IDC STAT BRADY AS VP PERCENT: 91.56 %
MDC IDC STAT BRADY RV PERCENT PACED: 3.2 %

## 2018-05-03 DIAGNOSIS — E1142 Type 2 diabetes mellitus with diabetic polyneuropathy: Secondary | ICD-10-CM | POA: Diagnosis not present

## 2018-05-03 DIAGNOSIS — B351 Tinea unguium: Secondary | ICD-10-CM | POA: Diagnosis not present

## 2018-06-06 ENCOUNTER — Other Ambulatory Visit: Payer: Self-pay | Admitting: Family Medicine

## 2018-06-21 DIAGNOSIS — Z08 Encounter for follow-up examination after completed treatment for malignant neoplasm: Secondary | ICD-10-CM | POA: Diagnosis not present

## 2018-06-21 DIAGNOSIS — L570 Actinic keratosis: Secondary | ICD-10-CM | POA: Diagnosis not present

## 2018-06-21 DIAGNOSIS — X32XXXD Exposure to sunlight, subsequent encounter: Secondary | ICD-10-CM | POA: Diagnosis not present

## 2018-06-21 DIAGNOSIS — Z85828 Personal history of other malignant neoplasm of skin: Secondary | ICD-10-CM | POA: Diagnosis not present

## 2018-06-21 DIAGNOSIS — D225 Melanocytic nevi of trunk: Secondary | ICD-10-CM | POA: Diagnosis not present

## 2018-06-23 ENCOUNTER — Ambulatory Visit: Payer: Medicare Other | Admitting: Family Medicine

## 2018-07-01 ENCOUNTER — Telehealth: Payer: Self-pay | Admitting: Family Medicine

## 2018-07-01 NOTE — Telephone Encounter (Signed)
Pharmacy requesting 90 day supply of Simvastatin 40 mg. Pt last seen 01/25/18 for HTN

## 2018-07-03 NOTE — Telephone Encounter (Signed)
May have 90-day.  Patient is due for a follow-up.  Under current insurance laws we can do this as a virtual visit.  I would recommend virtual visit within the next 30 days

## 2018-07-04 ENCOUNTER — Other Ambulatory Visit: Payer: Self-pay | Admitting: *Deleted

## 2018-07-04 MED ORDER — SIMVASTATIN 40 MG PO TABS: ORAL_TABLET | ORAL | 0 refills | Status: DC

## 2018-07-04 NOTE — Telephone Encounter (Signed)
Med sent to pharm. Left message to return call  

## 2018-07-12 NOTE — Telephone Encounter (Signed)
Left message to return call 

## 2018-07-14 NOTE — Telephone Encounter (Signed)
Left message to return call 

## 2018-07-15 NOTE — Telephone Encounter (Signed)
Pt notified he needs office visit and he was transferred to the front to schedule within the next 30 days

## 2018-07-18 ENCOUNTER — Other Ambulatory Visit: Payer: Self-pay

## 2018-07-18 ENCOUNTER — Ambulatory Visit (INDEPENDENT_AMBULATORY_CARE_PROVIDER_SITE_OTHER): Payer: Medicare Other | Admitting: *Deleted

## 2018-07-18 DIAGNOSIS — I5022 Chronic systolic (congestive) heart failure: Secondary | ICD-10-CM

## 2018-07-18 DIAGNOSIS — I447 Left bundle-branch block, unspecified: Secondary | ICD-10-CM | POA: Diagnosis not present

## 2018-07-18 LAB — CUP PACEART REMOTE DEVICE CHECK
Battery Remaining Longevity: 31 mo
Battery Voltage: 2.93 V
Brady Statistic AP VP Percent: 7.87 %
Brady Statistic AP VS Percent: 0.18 %
Brady Statistic AS VP Percent: 84.82 %
Brady Statistic AS VS Percent: 7 %
Brady Statistic RA Percent Paced: 2.29 %
Brady Statistic RV Percent Paced: 51.01 %
Date Time Interrogation Session: 20200413043933
Implantable Lead Implant Date: 20170914
Implantable Lead Implant Date: 20170914
Implantable Lead Implant Date: 20170914
Implantable Lead Location: 753858
Implantable Lead Location: 753859
Implantable Lead Location: 753860
Implantable Lead Model: 4598
Implantable Lead Model: 5076
Implantable Lead Model: 5076
Implantable Pulse Generator Implant Date: 20170914
Lead Channel Impedance Value: 285 Ohm
Lead Channel Impedance Value: 323 Ohm
Lead Channel Impedance Value: 399 Ohm
Lead Channel Impedance Value: 418 Ohm
Lead Channel Impedance Value: 437 Ohm
Lead Channel Impedance Value: 437 Ohm
Lead Channel Impedance Value: 475 Ohm
Lead Channel Impedance Value: 494 Ohm
Lead Channel Impedance Value: 532 Ohm
Lead Channel Impedance Value: 608 Ohm
Lead Channel Impedance Value: 627 Ohm
Lead Channel Impedance Value: 684 Ohm
Lead Channel Impedance Value: 741 Ohm
Lead Channel Impedance Value: 779 Ohm
Lead Channel Pacing Threshold Amplitude: 0.5 V
Lead Channel Pacing Threshold Amplitude: 0.625 V
Lead Channel Pacing Threshold Amplitude: 4.75 V
Lead Channel Pacing Threshold Pulse Width: 0.4 ms
Lead Channel Pacing Threshold Pulse Width: 0.4 ms
Lead Channel Pacing Threshold Pulse Width: 1 ms
Lead Channel Sensing Intrinsic Amplitude: 0.375 mV
Lead Channel Sensing Intrinsic Amplitude: 0.375 mV
Lead Channel Sensing Intrinsic Amplitude: 10.125 mV
Lead Channel Sensing Intrinsic Amplitude: 10.125 mV
Lead Channel Setting Pacing Amplitude: 2 V
Lead Channel Setting Pacing Amplitude: 2.5 V
Lead Channel Setting Pacing Amplitude: 4 V
Lead Channel Setting Pacing Pulse Width: 0.4 ms
Lead Channel Setting Pacing Pulse Width: 1 ms
Lead Channel Setting Sensing Sensitivity: 2.8 mV

## 2018-07-19 ENCOUNTER — Other Ambulatory Visit: Payer: Self-pay | Admitting: Family Medicine

## 2018-07-20 ENCOUNTER — Other Ambulatory Visit: Payer: Self-pay | Admitting: Cardiovascular Disease

## 2018-07-21 ENCOUNTER — Other Ambulatory Visit: Payer: Self-pay

## 2018-07-21 MED ORDER — METFORMIN HCL 500 MG PO TABS: ORAL_TABLET | ORAL | 0 refills | Status: DC

## 2018-07-23 ENCOUNTER — Other Ambulatory Visit: Payer: Self-pay | Admitting: Internal Medicine

## 2018-07-27 ENCOUNTER — Other Ambulatory Visit: Payer: Self-pay

## 2018-07-27 ENCOUNTER — Telehealth: Payer: Self-pay | Admitting: *Deleted

## 2018-07-27 ENCOUNTER — Ambulatory Visit (INDEPENDENT_AMBULATORY_CARE_PROVIDER_SITE_OTHER): Payer: Medicare Other | Admitting: Family Medicine

## 2018-07-27 DIAGNOSIS — M1000 Idiopathic gout, unspecified site: Secondary | ICD-10-CM

## 2018-07-27 DIAGNOSIS — I5022 Chronic systolic (congestive) heart failure: Secondary | ICD-10-CM

## 2018-07-27 DIAGNOSIS — I10 Essential (primary) hypertension: Secondary | ICD-10-CM | POA: Diagnosis not present

## 2018-07-27 DIAGNOSIS — E1169 Type 2 diabetes mellitus with other specified complication: Secondary | ICD-10-CM | POA: Diagnosis not present

## 2018-07-27 DIAGNOSIS — E7849 Other hyperlipidemia: Secondary | ICD-10-CM | POA: Diagnosis not present

## 2018-07-27 MED ORDER — METOPROLOL SUCCINATE ER 25 MG PO TB24: 50.0000 mg | ORAL_TABLET | Freq: Every day | ORAL | 1 refills | Status: DC

## 2018-07-27 MED ORDER — POTASSIUM CHLORIDE ER 10 MEQ PO TBCR: EXTENDED_RELEASE_TABLET | ORAL | 1 refills | Status: DC

## 2018-07-27 MED ORDER — METFORMIN HCL 500 MG PO TABS: ORAL_TABLET | ORAL | 1 refills | Status: DC

## 2018-07-27 MED ORDER — FINASTERIDE 5 MG PO TABS: ORAL_TABLET | ORAL | 1 refills | Status: DC

## 2018-07-27 MED ORDER — FUROSEMIDE 20 MG PO TABS: ORAL_TABLET | ORAL | 1 refills | Status: DC

## 2018-07-27 MED ORDER — ALLOPURINOL 100 MG PO TABS: ORAL_TABLET | ORAL | 1 refills | Status: DC

## 2018-07-27 MED ORDER — SIMVASTATIN 40 MG PO TABS: ORAL_TABLET | ORAL | 1 refills | Status: DC

## 2018-07-27 NOTE — Progress Notes (Signed)
   Subjective:  Video not available Telephone only Patient at home I was at office Coronavirus outbreak   Patient ID: Anthony Lucas, male    DOB: [birth year], 85 y.o.   MRN: [id number]  Diabetes  He presents for his follow-up diabetic visit. He has type 2 diabetes mellitus. Current diabetic treatments: metformin. He is compliant with treatment all of the time. Diabetic current diet: sugar free. He rarely participates in exercise. Home blood sugar record trend: not checking blood sugar. Eye exam is not current (2 years ago or more).  Denies any low sugar spells.  Denies any wooziness dizziness or headaches. Corn on his toe. Came up a couple months ago. Hurts sometimes.  Relates intermittently this causes him some trouble but no blisters or bleeding with it Pt wants to change refills to 90 day instead of 30 on all meds.  Patient with history of CHF finds himself not having any shortness of breath or swelling in legs able to do the activities he needs to without shortness of breath PND or orthopnea Does have a history of hyperlipidemia and gout Virtual Visit via Telephone Note  I connected with [name] on 07/27/18 at 10:00 AM EDT by telephone and verified that I am speaking with the correct person using two identifiers.   I discussed the limitations, risks, security and privacy concerns of performing an evaluation and management service by telephone and the availability of in person appointments. I also discussed with the patient that there may be a patient responsible charge related to this service. The patient expressed understanding and agreed to proceed.   History of Present Illness:    Observations/Objective:   Assessment and Plan:   Follow Up Instructions:    I discussed the assessment and treatment plan with the patient. The patient was provided an opportunity to ask questions and all were answered. The patient agreed with the plan and demonstrated an understanding of the  instructions.   The patient was advised to call back or seek an in-person evaluation if the symptoms worsen or if the condition fails to improve as anticipated.  I provided 17 minutes of non-face-to-face time during this encounter.           Review of Systems     Objective:   Physical Exam        Assessment & Plan:  HTN-good control according to the patient continue current medication  CHF under good control taking his medicines avoiding extra salt denies any swelling issues  Diabetes numbers in the past been good patient denies any problem watching his diet closely  Hyperlipidemia takes his medication on a regular basis monitors his diet closely.  Old labs reviewed new labs ordered  History of gout check uric acid level he  He will do lab work later in the summer and follow-up in September

## 2018-07-27 NOTE — Telephone Encounter (Signed)
Could not find med ordered in epic so I Called in rx for pt per dr Nicki Reaper.  Salicylic acid 92% in petrol jelly 60g apply thin layer on corn nightly as needed and wash off in the am.

## 2018-07-28 NOTE — Progress Notes (Signed)
Remote pacemaker transmission.   

## 2018-08-23 DIAGNOSIS — B351 Tinea unguium: Secondary | ICD-10-CM | POA: Diagnosis not present

## 2018-08-23 DIAGNOSIS — E1142 Type 2 diabetes mellitus with diabetic polyneuropathy: Secondary | ICD-10-CM | POA: Diagnosis not present

## 2018-08-31 ENCOUNTER — Other Ambulatory Visit (HOSPITAL_COMMUNITY)
Admission: RE | Admit: 2018-08-31 | Discharge: 2018-08-31 | Disposition: A | Discharge status: Home / Self Care | Payer: Medicare Other | Source: Ambulatory Visit | Attending: Urology | Admitting: Urology

## 2018-08-31 ENCOUNTER — Ambulatory Visit (INDEPENDENT_AMBULATORY_CARE_PROVIDER_SITE_OTHER): Payer: Medicare Other | Admitting: Urology

## 2018-08-31 DIAGNOSIS — N401 Enlarged prostate with lower urinary tract symptoms: Secondary | ICD-10-CM

## 2018-08-31 DIAGNOSIS — N3000 Acute cystitis without hematuria: Secondary | ICD-10-CM

## 2018-09-02 LAB — URINE CULTURE: Culture: >=100000 — AB

## 2018-09-08 ENCOUNTER — Telehealth: Payer: Self-pay | Admitting: Family Medicine

## 2018-09-08 MED ORDER — POTASSIUM CHLORIDE ER 10 MEQ PO TBCR: EXTENDED_RELEASE_TABLET | ORAL | 1 refills | Status: DC

## 2018-09-08 NOTE — Telephone Encounter (Signed)
Please send in 6 refills to [facility]

## 2018-09-08 NOTE — Telephone Encounter (Signed)
Patient came in the office to get a refill on Potassium CL ER 10 meq  sent to [facility]. He said he has one pill left.

## 2018-09-08 NOTE — Telephone Encounter (Signed)
Prescription sent electronically to pharmacy. Daughter(DPR) notified. °

## 2018-09-21 ENCOUNTER — Ambulatory Visit (INDEPENDENT_AMBULATORY_CARE_PROVIDER_SITE_OTHER): Payer: Medicare Other | Admitting: Urology

## 2018-09-21 ENCOUNTER — Other Ambulatory Visit (HOSPITAL_COMMUNITY)
Admission: RE | Admit: 2018-09-21 | Discharge: 2018-09-21 | Disposition: A | Discharge status: Home / Self Care | Payer: Medicare Other | Source: Ambulatory Visit | Attending: Urology | Admitting: Urology

## 2018-09-21 ENCOUNTER — Telehealth: Payer: Self-pay | Admitting: Cardiovascular Disease

## 2018-09-21 DIAGNOSIS — N3000 Acute cystitis without hematuria: Secondary | ICD-10-CM | POA: Insufficient documentation

## 2018-09-21 DIAGNOSIS — N401 Enlarged prostate with lower urinary tract symptoms: Secondary | ICD-10-CM

## 2018-09-21 NOTE — Telephone Encounter (Signed)
New Message   Pt c/o Shortness Of Breath: STAT if SOB developed within the last 24 hours or pt is noticeably SOB on the phone  1. Are you currently SOB (can you hear that pt is SOB on the phone)? Unknown, spoke with granddaughter in [place] because he called her and he sounded a little short of breath over the phone with them  2. How long have you been experiencing SOB? This morning   3. Are you SOB when sitting or when up moving around? Both moving around and sitting.   4. Are you currently experiencing any other symptoms? No   Granddaughter is concerned about his sob because he never calls them to complain of sob.

## 2018-09-21 NOTE — Telephone Encounter (Signed)
Attempt to reach patient to triage, line just rang

## 2018-09-23 NOTE — Telephone Encounter (Signed)
Called pt, no answer. Unable to leave message.

## 2018-09-24 LAB — URINE CULTURE: Culture: >=100000 — AB

## 2018-09-27 NOTE — Telephone Encounter (Signed)
Called pt. No answer. Unable to leave msg.  

## 2018-10-11 ENCOUNTER — Telehealth: Payer: Self-pay | Admitting: Adult Health

## 2018-10-11 NOTE — Telephone Encounter (Signed)

## 2018-10-11 NOTE — Progress Notes (Signed)
Cardiology Office Note   Date:  10/11/2018   ID:  GREELY ATIYEH, DOB 1930-08-21, MRN [id number]  PCP:  Kathyrn Drown, MD  Cardiologist:  Bronson Ing  CC: Dyspnea and fluid retention   History of Present Illness: Anthony Lucas is a 85 y.o. male who presents for ongoing assessment and management of coronary artery disease, hypertension, hyperlipidemia chronic systolic heart failure , biventricular pacemaker with severe left ventricular dysfunction, EF of 25%.  The patient was last seen by Dr. Bronson Ing on 02/07/2018.  At the time of that office visit the patient had stable NYHA class II symptoms.  He was continued on Lasix metoprolol and Entresto.  He was to continue aspirin which he takes intermittently as he occasionally has hematuria.  He called our office on 09/21/2018 with complaints of shortness of breath. Actually it was his granddaughter who lives in [place], that called our office to report that when she he spoke with him he appeared short of breath on the phone.  He was called several times for follow-up but did not answer the phone.  He did have an appointment made with me today for further evaluation.  He was here today with his daughter and has been experiencing worsening dyspnea over the last couple months, he is also noticed his heart rate has been elevated.  He denies coughing, fever, but has had more fatigue.  He is also noticed some worsening lower extremity edema.  He stated that his Lasix is not working as well as it used to.  He has gained about 8 pounds over the last couple of months.  He believes it is related to eating better.   Past Medical History:  Diagnosis Date  . Arthritis    "right knee" (12/19/2015)  . CHF (congestive heart failure) ([place])    Abnormal echo January 2017  . History of colon polyps   . History of gout   . Hyperlipidemia   . Hypertension   . Myocardial infarction [facility])    "they said I'd had a heart attack but didn't know it" (12/19/2015)  .  Presence of permanent cardiac pacemaker   . Seasonal allergies   . Skin cancer    "arms/hands" (12/19/2015)  . Sleep apnea    supposed to wear CPAP, but doesn't (12/19/2015)  . Type II diabetes mellitus ([place])     Past Surgical History:  Procedure Laterality Date  . CATARACT EXTRACTION W/PHACO Right 09/08/2012   Procedure: CATARACT EXTRACTION PHACO AND INTRAOCULAR LENS PLACEMENT (IOC);  Surgeon: Tonny Branch, MD;  Location: [facility];  Service: Ophthalmology;  Laterality: Right;  CDE: 21.28  . CATARACT EXTRACTION W/PHACO Left 12/08/2012   Procedure: CATARACT EXTRACTION PHACO AND INTRAOCULAR LENS PLACEMENT (IOC);  Surgeon: Tonny Branch, MD;  Location: [facility];  Service: Ophthalmology;  Laterality: Left;  CDE:25.72  . [place]  . COLONOSCOPY     4 procedures  . COLONOSCOPY  2012   Dr. Sharlett Iles: diverticulosis, lipoma in ascending colon.   Consuela Mimes WITH FULGERATION N/A 08/11/2017   Procedure: [place] OF PROSTATIC VARICES;  Surgeon: Cleon Gustin, MD;  Location: [facility];  Service: Urology;  Laterality: N/A;  . EP IMPLANTABLE DEVICE N/A 12/19/2015   Procedure: BiV Pacemaker Insertion CRT-P;  Surgeon: Evans Lance, MD;  Location: [place] CV LAB;  Service: Cardiovascular;  Laterality: N/A;  . INSERT / REPLACE / REMOVE PACEMAKER  12/19/2015  . JOINT REPLACEMENT Left    knee  . POLYPECTOMY    .  TONSILLECTOMY    . TOTAL KNEE ARTHROPLASTY Left 2000s  . TRANSURETHRAL RESECTION OF PROSTATE    . YAG LASER APPLICATION Right 3/87/5643   Procedure: YAG LASER APPLICATION;  Surgeon: [name], MD;  Location: [facility];  Service: Ophthalmology;  Laterality: Right;  . YAG LASER APPLICATION Left 07/03/5186   Procedure: YAG LASER APPLICATION;  Surgeon: [name], MD;  Location: [facility];  Service: Ophthalmology;  Laterality: Left;     Current Outpatient Medications  Medication Sig Dispense Refill  . acetaminophen (TYLENOL) 500 MG tablet Take 500 mg by mouth every  6 (six) hours as needed (for pain/headaches).    Marland Kitchen allopurinol (ZYLOPRIM) 100 MG tablet TAKE 2 TABLETS BY MOUTH DAILY FOR GOUT. 180 tablet 1  . aspirin EC 81 MG tablet Take 81 mg by mouth daily. Reported on 08/29/2015    . cetirizine (ZYRTEC) 10 MG tablet Take 10 mg by mouth daily as needed for allergies.    Marland Kitchen ENTRESTO 24-26 MG TAKE ONE TABLET BY MOUTH 2 TIMES A DAY. 60 tablet 6  . finasteride (PROSCAR) 5 MG tablet TAKE ONE TABLET BY MOUTH ONCE DAILY PROSTATE. 90 tablet 1  . furosemide (LASIX) 20 MG tablet Take one tablet each morning for fluid 90 tablet 1  . metFORMIN (GLUCOPHAGE) 500 MG tablet TAKE ONE TABLET BY MOUTH DAILY FOR DIABETES. 90 tablet 1  . metoprolol succinate (TOPROL-XL) 25 MG 24 hr tablet Take 2 tablets (50 mg total) by mouth daily. 180 tablet 1  . potassium chloride (K-DUR) 10 MEQ tablet TAKE 2 TABLETS BY MOUTH DAILY FOR POTASSIUM REPLACEMENT. 180 tablet 1  . simvastatin (ZOCOR) 40 MG tablet Take one tablet daily 90 tablet 1   No current facility-administered medications for this visit.     Allergies:   Bee venom    Social History:  The patient  reports that he has never smoked. He has never used smokeless tobacco. He reports previous alcohol use of about 2.0 standard drinks of alcohol per week. He reports that he does not use drugs.   Family History:  The patient's family history includes Lung cancer in his father.    ROS: All other systems are reviewed and negative. Unless otherwise mentioned in H&P    PHYSICAL EXAM: VS:  There were no vitals taken for this visit. , BMI There is no height or weight on file to calculate BMI. GEN: Well nourished, well developed, in no acute distress HEENT: normal Neck: no JVD, carotid bruits, or masses Cardiac: IRRR; tachycardic no murmurs, rubs, or gallops, 2+ ankle edema, 1+ pretibial edema  Respiratory: Bilateral crackles, diminished in the right base.  No wheezes or rhonchi GI: soft, nontender, nondistended, + BS MS: no  deformity or atrophy Skin: warm and dry, no rash Neuro:  Strength and sensation are intact Psych: euthymic mood, full affect   EKG: Ventricular paced rhythm with underlying atrial fib heart rate of 86 bpm,  Recent Labs: 01/26/2018: ALT 14; BUN 15; Creatinine, Ser 1.06; Potassium 4.6; Sodium 142    Lipid Panel    Component Value Date/Time   CHOL 148 01/26/2018 0857   TRIG 103 01/26/2018 0857   HDL 52 01/26/2018 0857   CHOLHDL 2.8 01/26/2018 0857   CHOLHDL 3.4 01/23/2014 0841   VLDL 24 01/23/2014 0841   LDLCALC 75 01/26/2018 0857      Wt Readings from Last 3 Encounters:  02/07/18 208 lb 9.6 oz (94.6 kg)  01/25/18 208 lb 9.6 oz (94.6 kg)  09/22/17 202 lb (  91.6 kg)      Other studies Reviewed: Echocardiogram 2015/09/24 Left ventricle: The cavity size was mildly dilated. Wall   thickness was at the upper limits of normal. Systolic function   was severely reduced. The estimated ejection fraction was 25%.   Diffuse hypokinesis. Features are consistent with a pseudonormal   left ventricular filling pattern, with concomitant abnormal   relaxation and increased filling pressure (grade 2 diastolic   dysfunction). - Ventricular septum: Septal motion showed abnormal function and   dyssynergy. - Aortic valve: Trileaflet; mildly thickened leaflets. - Mitral valve: Calcified annulus. Mildly thickened leaflets .   There was trivial regurgitation. - Left atrium: The atrium was mildly to moderately dilated. - Right atrium: Central venous pressure (est): 3 mm Hg. - Tricuspid valve: There was mild regurgitation. - Pulmonary arteries: PA peak pressure: 27 mm Hg (S). - Pericardium, extracardiac: There was no pericardial effusion.  Remote PPM interrogation 07/18/2018  Scheduled remote reviewed. Normal device function. Total BIV pacing 71.6% due to conducted AF. Appears persistent AF since 05/11/18. No history noted of AF and no OAC, will forward to Triage for evaluation.   ASSESSMENT  AND PLAN:  1.  Atrial fibrillation: Rate is not well controlled currently.  EKG reveals 86 bpm but apically he is running 96 bpm.  I am going to increase his metoprolol succinate to 50 mg in the a.m. and 25 mg in the p.m. for better heart rate control and cardiac output.  He will continue aspirin therapy.  He is not on anticoagulation therapy due to GI bleed.  2.  Chronic systolic CHF: He appears to have evidence of fluid overload with weight gain, diminished breath sounds in the bases, with lower extremity edema.  He states that his furosemide is not working as well any longer.  I am going to get a chest x-ray to rule out CHF, change his furosemide to torsemide 20 mg daily and also have a be met drawn for baseline labs with BMET and BNP.  He will need to have a repeat echocardiogram at some point in the future just for reassessment of his LV function.  Continue on Entresto 24/27 mg twice daily.  May consider going up on the dose if his blood pressure remains elevated.  3.  Hypertension: Initial blood pressure during this office visit was 144/82.  I rechecked it myself and it had come down to 132/70.  This is still not optimal for reduced EF.  Will likely need to increase Entresto on next visit but will wait to see what blood pressure responses with increased dose of metoprolol as discussed above.  4.  ICD in situ: [employer] due for interrogation on October 18, 2018 via remote check.  He will have a follow-up appointment with Dr. Lovena Le on first available in [place].  5.  Hypercholesterolemia: Continue simvastatin 40 mg daily.  Follow-up lipids and LFTs are performed by primary care physician Dr. Wolfgang Phoenix.  Current medicines are reviewed at length with the patient today.    Labs/ tests ordered today include: BMET and BNP, CXR for CHF-can be done at [facility] as this is closer to his home.  Phill Myron. West Pugh, ANP, AACC   10/11/2018 7:12 AM    [place] [address] Office [phone] Fax [phone]

## 2018-10-12 ENCOUNTER — Ambulatory Visit (INDEPENDENT_AMBULATORY_CARE_PROVIDER_SITE_OTHER): Payer: Medicare Other | Admitting: Adult Health

## 2018-10-12 ENCOUNTER — Other Ambulatory Visit: Payer: Self-pay

## 2018-10-12 ENCOUNTER — Encounter: Payer: Self-pay | Admitting: Adult Health

## 2018-10-12 VITALS — BP 144/82 | HR 86 | Ht 73.0 in | Wt 210.6 lb

## 2018-10-12 DIAGNOSIS — E7800 Pure hypercholesterolemia, unspecified: Secondary | ICD-10-CM

## 2018-10-12 DIAGNOSIS — I43 Cardiomyopathy in diseases classified elsewhere: Secondary | ICD-10-CM | POA: Diagnosis not present

## 2018-10-12 DIAGNOSIS — I4811 Longstanding persistent atrial fibrillation: Secondary | ICD-10-CM | POA: Diagnosis not present

## 2018-10-12 DIAGNOSIS — I5020 Unspecified systolic (congestive) heart failure: Secondary | ICD-10-CM | POA: Diagnosis not present

## 2018-10-12 DIAGNOSIS — Z950 Presence of cardiac pacemaker: Secondary | ICD-10-CM

## 2018-10-12 DIAGNOSIS — R0602 Shortness of breath: Secondary | ICD-10-CM

## 2018-10-12 DIAGNOSIS — Z79899 Other long term (current) drug therapy: Secondary | ICD-10-CM

## 2018-10-12 MED ORDER — TORSEMIDE 20 MG PO TABS: 20.0000 mg | ORAL_TABLET | Freq: Every day | ORAL | 3 refills | Status: DC

## 2018-10-12 MED ORDER — METOPROLOL SUCCINATE ER 50 MG PO TB24: ORAL_TABLET | ORAL | 3 refills | Status: DC

## 2018-10-12 NOTE — Patient Instructions (Addendum)
Medication Instructions:  STOP- Furosemide START- Torsemide 20 mg daily INCREASE- metoprolol 50 mg in the morning and 25 mg in the evening  If you need a refill on your cardiac medications before your next appointment, please call your pharmacy.  Labwork: BMP and BNP  HERE IN OUR OFFICE AT [facility]  You will need to fast. DO NOT EAT OR DRINK PAST MIDNIGHT.     You will NOT need to fast   Take the provided lab slips with you to the lab for your blood draw.   When you have your labs (blood work) drawn today and your tests are completely normal, you will receive your results only by MyChart Message (if you have MyChart) -OR-  A paper copy in the mail.  If you have any lab test that is abnormal or we need to change your treatment, we will call you to review these results.  Testing/Procedures: A chest x-ray takes a picture of the organs and structures inside the chest, including the heart, lungs, and blood vessels. This test can show several things, including, whether the heart is enlarges; whether fluid is building up in the lungs; and whether pacemaker / defibrillator leads are still in place.  Follow-Up: . Your physician recommends that you schedule a follow-up appointment in: 2 weeks . Your physician recommends that you schedule a follow-up appointment in: Dr Lovena Le next available . Your physician recommends that you schedule a follow-up appointment in: Dr Bronson Ing next available   At [facility], you and your health needs are our priority.  As part of our continuing mission to provide you with exceptional heart care, we have created designated Provider Care Teams.  These Care Teams include your primary Cardiologist (physician) and Advanced Practice Providers (APPs -  Physician Assistants and Nurse Practitioners) who all work together to provide you with the care you need, when you need it.  Thank you for choosing [facility] at [facility]!!

## 2018-10-13 ENCOUNTER — Ambulatory Visit (HOSPITAL_COMMUNITY)
Admission: RE | Admit: 2018-10-13 | Discharge: 2018-10-13 | Disposition: A | Discharge status: Home / Self Care | Payer: Medicare Other | Source: Ambulatory Visit | Attending: Adult Health | Admitting: Adult Health

## 2018-10-13 ENCOUNTER — Telehealth: Payer: Self-pay | Admitting: *Deleted

## 2018-10-13 DIAGNOSIS — R0602 Shortness of breath: Secondary | ICD-10-CM | POA: Diagnosis not present

## 2018-10-13 DIAGNOSIS — I5020 Unspecified systolic (congestive) heart failure: Secondary | ICD-10-CM | POA: Diagnosis not present

## 2018-10-13 DIAGNOSIS — Z79899 Other long term (current) drug therapy: Secondary | ICD-10-CM

## 2018-10-13 LAB — BASIC METABOLIC PANEL
BUN/Creatinine Ratio: 11 (ref 10–24)
BUN: 15 mg/dL (ref 8–27)
CO2: 24 mmol/L (ref 20–29)
Calcium: 9 mg/dL (ref 8.6–10.2)
Chloride: 102 mmol/L (ref 96–106)
Creatinine, Ser: 1.35 mg/dL — ABNORMAL HIGH (ref 0.76–1.27)
GFR calc Af Amer: 55 mL/min/{1.73_m2} — ABNORMAL LOW (ref >59)
GFR calc non Af Amer: 48 mL/min/{1.73_m2} — ABNORMAL LOW (ref >59)
Glucose: 124 mg/dL — ABNORMAL HIGH (ref 65–99)
Potassium: 5.5 mmol/L — ABNORMAL HIGH (ref 3.5–5.2)
Sodium: 141 mmol/L (ref 134–144)

## 2018-10-13 LAB — BRAIN NATRIURETIC PEPTIDE: BNP: 373.1 pg/mL — ABNORMAL HIGH (ref 0.0–100.0)

## 2018-10-13 NOTE — Telephone Encounter (Signed)
Pt aware of his blood work and chest xray, BMP ordered to get done in 1 week

## 2018-10-13 NOTE — Telephone Encounter (Signed)
-----   Message from Lendon Colonel, NP sent at 10/13/2018  7:10 AM EDT ----- Labs are reviewed. His creatinine is up a bit. Taking the torsemide may improve this. Repeat the BMET in one week.Marland Kitchen Awaiting BNP and CXR.

## 2018-10-13 NOTE — Telephone Encounter (Signed)
-----   Message from Lendon Colonel, NP sent at 10/13/2018  1:11 PM EDT ----- There was no evidence of fluid accumulation in the lower lungs. Likely from atelectasis, or compressed air sacs, alveoli, in the lungs causing diminished lung sounds. Good report, Hope the torsemide is helping.

## 2018-10-17 ENCOUNTER — Ambulatory Visit (INDEPENDENT_AMBULATORY_CARE_PROVIDER_SITE_OTHER): Payer: Medicare Other | Admitting: *Deleted

## 2018-10-17 DIAGNOSIS — I5022 Chronic systolic (congestive) heart failure: Secondary | ICD-10-CM | POA: Diagnosis not present

## 2018-10-17 LAB — CUP PACEART REMOTE DEVICE CHECK
Battery Remaining Longevity: 25 mo
Battery Voltage: 2.92 V
Brady Statistic AP VP Percent: 0 %
Brady Statistic AP VS Percent: 0 %
Brady Statistic AS VP Percent: 0 %
Brady Statistic AS VS Percent: 0 %
Brady Statistic RA Percent Paced: 0.08 %
Brady Statistic RV Percent Paced: 64.01 %
Date Time Interrogation Session: 20200713053411
Implantable Lead Implant Date: 20170914
Implantable Lead Implant Date: 20170914
Implantable Lead Implant Date: 20170914
Implantable Lead Location: 753858
Implantable Lead Location: 753859
Implantable Lead Location: 753860
Implantable Lead Model: 4598
Implantable Lead Model: 5076
Implantable Lead Model: 5076
Implantable Pulse Generator Implant Date: 20170914
Lead Channel Impedance Value: 323 Ohm
Lead Channel Impedance Value: 342 Ohm
Lead Channel Impedance Value: 437 Ohm
Lead Channel Impedance Value: 456 Ohm
Lead Channel Impedance Value: 475 Ohm
Lead Channel Impedance Value: 494 Ohm
Lead Channel Impedance Value: 513 Ohm
Lead Channel Impedance Value: 513 Ohm
Lead Channel Impedance Value: 513 Ohm
Lead Channel Impedance Value: 646 Ohm
Lead Channel Impedance Value: 684 Ohm
Lead Channel Impedance Value: 703 Ohm
Lead Channel Impedance Value: 779 Ohm
Lead Channel Impedance Value: 817 Ohm
Lead Channel Pacing Threshold Amplitude: 0.5 V
Lead Channel Pacing Threshold Amplitude: 0.625 V
Lead Channel Pacing Threshold Amplitude: 4.5 V
Lead Channel Pacing Threshold Pulse Width: 0.4 ms
Lead Channel Pacing Threshold Pulse Width: 0.4 ms
Lead Channel Pacing Threshold Pulse Width: 1 ms
Lead Channel Sensing Intrinsic Amplitude: 1.75 mV
Lead Channel Sensing Intrinsic Amplitude: 1.75 mV
Lead Channel Sensing Intrinsic Amplitude: 10.75 mV
Lead Channel Sensing Intrinsic Amplitude: 10.75 mV
Lead Channel Setting Pacing Amplitude: 2 V
Lead Channel Setting Pacing Amplitude: 2.5 V
Lead Channel Setting Pacing Amplitude: 4 V
Lead Channel Setting Pacing Pulse Width: 0.4 ms
Lead Channel Setting Pacing Pulse Width: 1 ms
Lead Channel Setting Sensing Sensitivity: 2.8 mV

## 2018-10-19 ENCOUNTER — Other Ambulatory Visit: Payer: Self-pay

## 2018-10-19 ENCOUNTER — Other Ambulatory Visit (HOSPITAL_COMMUNITY)
Admission: RE | Admit: 2018-10-19 | Discharge: 2018-10-19 | Disposition: A | Discharge status: Home / Self Care | Payer: Medicare Other | Source: Ambulatory Visit | Attending: Cardiology | Admitting: Cardiology

## 2018-10-19 DIAGNOSIS — Z79899 Other long term (current) drug therapy: Secondary | ICD-10-CM | POA: Insufficient documentation

## 2018-10-19 LAB — BASIC METABOLIC PANEL
Anion gap: 12 (ref 5–15)
BUN: 26 mg/dL — ABNORMAL HIGH (ref 8–23)
CO2: 29 mmol/L (ref 22–32)
Calcium: 9.4 mg/dL (ref 8.9–10.3)
Chloride: 102 mmol/L (ref 98–111)
Creatinine, Ser: 1.24 mg/dL (ref 0.61–1.24)
GFR calc Af Amer: >60 mL/min (ref >60)
GFR calc non Af Amer: 53 mL/min — ABNORMAL LOW (ref >60)
Glucose, Bld: 135 mg/dL — ABNORMAL HIGH (ref 70–99)
Potassium: 3.7 mmol/L (ref 3.5–5.1)
Sodium: 143 mmol/L (ref 135–145)

## 2018-10-25 ENCOUNTER — Telehealth: Payer: Self-pay | Admitting: Adult Health

## 2018-10-25 NOTE — Telephone Encounter (Signed)
° ° °  COVID-19 Pre-Screening Questions:   In the past 7 to 10 days have you had a cough,  shortness of breath, headache, congestion, fever (100 or greater) body aches, chills, sore throat, or sudden loss of taste or sense of smell? no  Have you been around anyone with known Covid 19.  Have you been around anyone who is awaiting Covid 19 test results in the past 7 to 10 days? bno  Have you been around anyone who has been exposed to Covid 19, or has mentioned symptoms of Covid 19 within the past 7 to 10 days? no  If you have any concerns/questions about symptoms patients report during screening (either on the phone or at threshold). Contact the provider seeing the patient or DOD for further guidance.  If neither are available contact a member of the leadership team.

## 2018-10-25 NOTE — Progress Notes (Signed)
Cardiology Office Note   Date:  10/26/2018   ID:  THERESA DOHRMAN, DOB 02-Nov-1930, MRN [id number]  PCP:  Kathyrn Drown, MD  Cardiologist:  Bronson Ing  Follow Up Atrial fib with medication changes   History of Present Illness: Anthony Lucas is a 85 y.o. male who presents for ongoing assessment and management of coronary artery disease with history of hyperlipidemia, chronic systolic heart failure, hypertension, biventricular pacemaker in the setting of severe LV dysfunction with an EF of 25%.  He was last seen in our office on 10/12/2018, and was experiencing worsening dyspnea is over the last couple of months and also noticing that his heart rate was elevated.  He stated that the Lasix that he was taking was not working as well as he used to and he had gained about 8 pounds over the last couple of months.  He thinks it is because he has been eating better.  EKG completed during the office visit revealed atrial fibrillation with a rate not well controlled at 98 bpm.  I increase his metoprolol succinate to 50 mg in the a.m. and 25 mg in the p.m. for better heart rate control and cardiac output.  He is not on anticoagulation therapy due to history of GI bleed but he was continued on aspirin therapy.    He also was found to have some evidence of fluid overload with weight gain diminished breath sounds in the bases.  He did have some lower extremity edema.  A chest x-ray did not reveal evidence of CHF or COPD.  His furosemide was changed to torsemide 20 mg daily with follow-up BMET to be completed.  He was continued on Entresto 24/27 mg twice daily, was to follow-up with [place] today for assessment of his response to medications.  Labs which were drawn on 10/19/2018 revealed a BUN of 26 creatinine 1.24,(was 1.35 previously) sodium 143 with potassium of 3.7.  BNP was elevated at 373.1.  Chest x-ray is stated revealed no acute abnormality of the lungs.  He has lost 7 pounds since last office visit.  He still  states that his breathing status is not what it should be but he is feeling more comfortable.  He denies palpitations chest pain or leg cramping.  He has generalized fatigue but it he states is because he is having to stay in the home more due to the heat and COVID restrictions.  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   Past Medical History:  Diagnosis Date  . Arthritis    "right knee" (12/19/2015)  . CHF (congestive heart failure) ([place])    Abnormal echo January 2017  . History of colon polyps   . History of gout   . Hyperlipidemia   . Hypertension   . Myocardial infarction [facility])    "they said I'd had a heart attack but didn't know it" (12/19/2015)  . Presence of permanent cardiac pacemaker   . Seasonal allergies   . Skin cancer    "arms/hands" (12/19/2015)  . Sleep apnea    supposed to wear CPAP, but doesn't (12/19/2015)  . Type II diabetes mellitus ([place])     Past Surgical History:  Procedure Laterality Date  . CATARACT EXTRACTION W/PHACO Right 09/08/2012   Procedure: CATARACT EXTRACTION PHACO AND INTRAOCULAR LENS PLACEMENT (IOC);  Surgeon: Tonny Branch, MD;  Location: [facility];  Service: Ophthalmology;  Laterality: Right;  CDE: 21.28  . CATARACT EXTRACTION W/PHACO Left 12/08/2012  Procedure: CATARACT EXTRACTION PHACO AND INTRAOCULAR LENS PLACEMENT (IOC);  Surgeon: Tonny Branch, MD;  Location: [facility];  Service: Ophthalmology;  Laterality: Left;  CDE:25.72  . [place]  . COLONOSCOPY     4 procedures  . COLONOSCOPY  2012   Dr. Sharlett Iles: diverticulosis, lipoma in ascending colon.   Consuela Mimes WITH FULGERATION N/A 08/11/2017   Procedure: [place] OF PROSTATIC VARICES;  Surgeon: Cleon Gustin, MD;  Location: [facility];  Service: Urology;  Laterality: N/A;  . EP IMPLANTABLE DEVICE N/A 12/19/2015   Procedure: BiV Pacemaker Insertion CRT-P;  Surgeon: Evans Lance, MD;  Location: [place] CV LAB;  Service:  Cardiovascular;  Laterality: N/A;  . INSERT / REPLACE / REMOVE PACEMAKER  12/19/2015  . JOINT REPLACEMENT Left    knee  . POLYPECTOMY    . TONSILLECTOMY    . TOTAL KNEE ARTHROPLASTY Left 2000s  . TRANSURETHRAL RESECTION OF PROSTATE    . YAG LASER APPLICATION Right 3/87/5643   Procedure: YAG LASER APPLICATION;  Surgeon: [name], MD;  Location: [facility];  Service: Ophthalmology;  Laterality: Right;  . YAG LASER APPLICATION Left 07/03/5186   Procedure: YAG LASER APPLICATION;  Surgeon: [name], MD;  Location: [facility];  Service: Ophthalmology;  Laterality: Left;     Current Outpatient Medications  Medication Sig Dispense Refill  . acetaminophen (TYLENOL) 500 MG tablet Take 500 mg by mouth every 6 (six) hours as needed (for pain/headaches).    Marland Kitchen allopurinol (ZYLOPRIM) 100 MG tablet TAKE 2 TABLETS BY MOUTH DAILY FOR GOUT. 180 tablet 1  . aspirin EC 81 MG tablet Take 81 mg by mouth daily. Reported on 08/29/2015    . cetirizine (ZYRTEC) 10 MG tablet Take 10 mg by mouth daily as needed for allergies.    Marland Kitchen ENTRESTO 24-26 MG TAKE ONE TABLET BY MOUTH 2 TIMES A DAY. 60 tablet 6  . finasteride (PROSCAR) 5 MG tablet TAKE ONE TABLET BY MOUTH ONCE DAILY PROSTATE. 90 tablet 1  . metFORMIN (GLUCOPHAGE) 500 MG tablet TAKE ONE TABLET BY MOUTH DAILY FOR DIABETES. 90 tablet 1  . metoprolol succinate (TOPROL-XL) 50 MG 24 hr tablet Take 50 mg(1 tablet) in the morning and 25 mg(1/2 tablet) in the evening 135 tablet 3  . potassium chloride (K-DUR) 10 MEQ tablet TAKE 2 TABLETS BY MOUTH DAILY FOR POTASSIUM REPLACEMENT. 180 tablet 1  . simvastatin (ZOCOR) 40 MG tablet Take one tablet daily 90 tablet 1  . torsemide (DEMADEX) 20 MG tablet Take 1 tablet (20 mg total) by mouth daily. 90 tablet 3   No current facility-administered medications for this visit.     Allergies:   Bee venom    Social History:  The patient  reports that he has never smoked. He has never used smokeless tobacco. He reports previous  alcohol use of about 2.0 standard drinks of alcohol per week. He reports that he does not use drugs.   Family History:  The patient's family history includes Lung cancer in his father.    ROS: All other systems are reviewed and negative. Unless otherwise mentioned in H&P    PHYSICAL EXAM: VS:  BP 124/84   Pulse 82   Temp 97.7 F (36.5 C)   Ht 6\' 1"  (1.854 m)   Wt 203 lb (92.1 kg)   BMI 26.78 kg/m  , BMI Body mass index is 26.78 kg/m. GEN: Well nourished, well developed, in no acute distress HEENT: normal Neck: no JVD, carotid bruits,  or masses Cardiac: IRRR; no murmurs, rubs, or gallops,no edema  Respiratory:  Clear to auscultation bilaterally, normal work of breathing GI: soft, nontender, nondistended, + BS MS: no deformity or atrophy Skin: warm and dry, no rash Neuro:  Strength and sensation are intact Psych: euthymic mood, full affect   EKG: Atrial fib with PACs, demand pacemaker.  Rate of 82 bpm.  Left bundle branch block  Recent Labs: 01/26/2018: ALT 14 10/12/2018: BNP 373.1 10/19/2018: BUN 26; Creatinine, Ser 1.24; Potassium 3.7; Sodium 143    Lipid Panel    Component Value Date/Time   CHOL 148 01/26/2018 0857   TRIG 103 01/26/2018 0857   HDL 52 01/26/2018 0857   CHOLHDL 2.8 01/26/2018 0857   CHOLHDL 3.4 01/23/2014 0841   VLDL 24 01/23/2014 0841   LDLCALC 75 01/26/2018 0857      Wt Readings from Last 3 Encounters:  10/26/18 203 lb (92.1 kg)  10/12/18 210 lb 9.6 oz (95.5 kg)  02/07/18 208 lb 9.6 oz (94.6 kg)      Other studies Reviewed: Echocardiogram September 25, 2015 Left ventricle: The cavity size was mildly dilated. Wall   thickness was at the upper limits of normal. Systolic function   was severely reduced. The estimated ejection fraction was 25%.   Diffuse hypokinesis. Features are consistent with a pseudonormal   left ventricular filling pattern, with concomitant abnormal   relaxation and increased filling pressure (grade 2 diastolic   dysfunction).  - Ventricular septum: Septal motion showed abnormal function and   dyssynergy. - Aortic valve: Trileaflet; mildly thickened leaflets. - Mitral valve: Calcified annulus. Mildly thickened leaflets .   There was trivial regurgitation. - Left atrium: The atrium was mildly to moderately dilated. - Right atrium: Central venous pressure (est): 3 mm Hg. - Tricuspid valve: There was mild regurgitation. - Pulmonary arteries: PA peak pressure: 27 mm Hg (S). - Pericardium, extracardiac: There was no pericardial effusion.   ASSESSMENT AND PLAN:  1.  Atrial fibrillation: Heart rate is well controlled.  He is not on anticoagulation due to GI bleed.  Heart rate is better controlled and on last office visit where it was elevated at 98 bpm.  He continues to have generalized fatigue but I do not believe it is related to the increased dose of metoprolol as he says this is unchanged.  He will continue his current medication management at this time without further adjustments.  2.  Chronic systolic heart failure: He is doing better with change from Lasix to torsemide.  He is down 7 pounds.  Lower extremity edema is improved.  Creatinine has improved.  We will continue him on current medication regimen.  If he begins to have symptomatic chest pain, dizziness, orthostatic hypotension, or worsening fatigue he is to call us.  Due to his frailty, I am reluctant to increase his Entresto higher.  Blood pressure is 124/84 which is not optimal for reduced EF of 25%.  However I do not want to cause dizziness as his energy level is low to begin with.  Can consider increasing dose when followed up in the office.  Would recommend repeat echocardiogram if clinically warranted.  3.  BiV pacemaker in situ.:  He is due for pacemaker check on September 3 and to see Dr. Lovena Le for follow-up.  He is to keep that appointment.   Current medicines are reviewed at length with the patient today.    Labs/ tests ordered today include:  None   Phill Myron. West Pugh, ANP, AACC  10/26/2018 1:43 PM    [place] [address] [place] [address] Office [phone] Fax [phone]

## 2018-10-25 NOTE — Telephone Encounter (Signed)
New Message:     Pt says daughter will need to come in with him for appt on 10-26-18 with Jory Sims. He said she came in last week with him, and the doctor is expecting her this week.

## 2018-10-26 ENCOUNTER — Encounter: Payer: Self-pay | Admitting: Adult Health

## 2018-10-26 ENCOUNTER — Ambulatory Visit (INDEPENDENT_AMBULATORY_CARE_PROVIDER_SITE_OTHER): Payer: Medicare Other | Admitting: Adult Health

## 2018-10-26 ENCOUNTER — Encounter (INDEPENDENT_AMBULATORY_CARE_PROVIDER_SITE_OTHER): Payer: Self-pay

## 2018-10-26 ENCOUNTER — Other Ambulatory Visit: Payer: Self-pay

## 2018-10-26 VITALS — BP 124/84 | HR 82 | Temp 97.7°F | Ht 73.0 in | Wt 203.0 lb

## 2018-10-26 DIAGNOSIS — Z950 Presence of cardiac pacemaker: Secondary | ICD-10-CM

## 2018-10-26 DIAGNOSIS — Z79899 Other long term (current) drug therapy: Secondary | ICD-10-CM | POA: Diagnosis not present

## 2018-10-26 DIAGNOSIS — I519 Heart disease, unspecified: Secondary | ICD-10-CM | POA: Diagnosis not present

## 2018-10-26 DIAGNOSIS — I4811 Longstanding persistent atrial fibrillation: Secondary | ICD-10-CM | POA: Diagnosis not present

## 2018-10-26 DIAGNOSIS — I5022 Chronic systolic (congestive) heart failure: Secondary | ICD-10-CM

## 2018-10-26 DIAGNOSIS — I5020 Unspecified systolic (congestive) heart failure: Secondary | ICD-10-CM

## 2018-10-26 DIAGNOSIS — I10 Essential (primary) hypertension: Secondary | ICD-10-CM | POA: Diagnosis not present

## 2018-10-26 NOTE — Telephone Encounter (Signed)
Its okay to have his daughter with him.  He has trouble remembering and she is helpful with addressing concerns and making sure the medications are given correctly.

## 2018-10-26 NOTE — Patient Instructions (Addendum)
Medication Instructions:  Continue current medications  If you need a refill on your cardiac medications before your next appointment, please call your pharmacy.  Labwork: None Ordered   Testing/Procedures: None Ordered  Follow-Up: .  Keep appointment with Dr Bronson Ing on September 23rd    At [facility], you and your health needs are our priority.  As part of our continuing mission to provide you with exceptional heart care, we have created designated Provider Care Teams.  These Care Teams include your primary Cardiologist (physician) and Advanced Practice Providers (APPs -  Physician Assistants and Nurse Practitioners) who all work together to provide you with the care you need, when you need it.  Thank you for choosing [facility] at [facility]!!

## 2018-10-27 ENCOUNTER — Other Ambulatory Visit (INDEPENDENT_AMBULATORY_CARE_PROVIDER_SITE_OTHER): Payer: Medicare Other

## 2018-10-27 DIAGNOSIS — I10 Essential (primary) hypertension: Secondary | ICD-10-CM

## 2018-10-27 DIAGNOSIS — I5020 Unspecified systolic (congestive) heart failure: Secondary | ICD-10-CM

## 2018-10-27 DIAGNOSIS — I519 Heart disease, unspecified: Secondary | ICD-10-CM

## 2018-10-27 DIAGNOSIS — I5022 Chronic systolic (congestive) heart failure: Secondary | ICD-10-CM

## 2018-10-27 DIAGNOSIS — I4811 Longstanding persistent atrial fibrillation: Secondary | ICD-10-CM

## 2018-10-27 DIAGNOSIS — Z950 Presence of cardiac pacemaker: Secondary | ICD-10-CM

## 2018-10-27 DIAGNOSIS — Z79899 Other long term (current) drug therapy: Secondary | ICD-10-CM

## 2018-10-27 NOTE — Progress Notes (Signed)
Remote pacemaker transmission.   

## 2018-11-01 DIAGNOSIS — B351 Tinea unguium: Secondary | ICD-10-CM | POA: Diagnosis not present

## 2018-11-01 DIAGNOSIS — E1142 Type 2 diabetes mellitus with diabetic polyneuropathy: Secondary | ICD-10-CM | POA: Diagnosis not present

## 2018-12-08 ENCOUNTER — Telehealth: Payer: Self-pay | Admitting: Cardiovascular Disease

## 2018-12-08 ENCOUNTER — Ambulatory Visit (INDEPENDENT_AMBULATORY_CARE_PROVIDER_SITE_OTHER): Payer: Medicare Other | Admitting: Internal Medicine

## 2018-12-08 ENCOUNTER — Encounter: Payer: Self-pay | Admitting: Internal Medicine

## 2018-12-08 ENCOUNTER — Other Ambulatory Visit: Payer: Self-pay

## 2018-12-08 ENCOUNTER — Encounter: Payer: Medicare Other | Admitting: Internal Medicine

## 2018-12-08 VITALS — BP 160/80 | HR 62 | Temp 97.8°F | Ht 72.0 in | Wt 206.0 lb

## 2018-12-08 DIAGNOSIS — I5022 Chronic systolic (congestive) heart failure: Secondary | ICD-10-CM

## 2018-12-08 DIAGNOSIS — Z79899 Other long term (current) drug therapy: Secondary | ICD-10-CM

## 2018-12-08 LAB — CUP PACEART INCLINIC DEVICE CHECK
Date Time Interrogation Session: 20200903092121
Implantable Lead Implant Date: 20170914
Implantable Lead Implant Date: 20170914
Implantable Lead Implant Date: 20170914
Implantable Lead Location: 753858
Implantable Lead Location: 753859
Implantable Lead Location: 753860
Implantable Lead Model: 4598
Implantable Lead Model: 5076
Implantable Lead Model: 5076
Implantable Pulse Generator Implant Date: 20170914

## 2018-12-08 MED ORDER — TORSEMIDE 20 MG PO TABS: 20.0000 mg | ORAL_TABLET | Freq: Two times a day (BID) | ORAL | 3 refills | Status: DC

## 2018-12-08 MED ORDER — DIGOXIN 125 MCG PO TABS: 0.1250 mg | ORAL_TABLET | Freq: Every day | ORAL | 3 refills | Status: DC

## 2018-12-08 NOTE — Telephone Encounter (Signed)
Daughter states that pt is also taking lasix 20 mg that was not listed on meds during office visit. When picking up Digoxin from pharmacy daughter was informed that they are not to be taken together. Pt has BMP,Digoxin level due on 9/18. Please advise.

## 2018-12-08 NOTE — Patient Instructions (Signed)
Medication Instructions:  Your physician has recommended you make the following change in your medication:  Torsemide 20 mg Two Times Daily  Digoxin 0.125 mg Daily    If you need a refill on your cardiac medications before your next appointment, please call your pharmacy.   Lab work: Your physician recommends that you return for lab work in: 2 weeks ( 12/23/18)   If you have labs (blood work) drawn today and your tests are completely normal, you will receive your results only by: Marland Kitchen MyChart Message (if you have MyChart) OR . A paper copy in the mail If you have any lab test that is abnormal or we need to change your treatment, we will call you to review the results.  Testing/Procedures: NONE    Follow-Up: At [facility], you and your health needs are our priority.  As part of our continuing mission to provide you with exceptional heart care, we have created designated Provider Care Teams.  These Care Teams include your primary Cardiologist (physician) and Advanced Practice Providers (APPs -  Physician Assistants and Nurse Practitioners) who all work together to provide you with the care you need, when you need it. You will need a follow up appointment in 1 years.  Please call our office 2 months in advance to schedule this appointment.  You may see None or one of the following Advanced Practice Providers on your designated Care Team:   Chanetta Marshall, NP . Tommye Standard, PA-C  Any Other Special Instructions Will Be Listed Below (If Applicable). Thank you for choosing [place]!

## 2018-12-08 NOTE — Progress Notes (Signed)
HPI [name] returns today for followup. He has a h/o chronic systolic heartf ailure, s/p biv PPM insertion. His LV lead is chronically elevated in pacing threshold. He has developed atrial fib with a mostly controlled Vr. The patient has had worsening CHF symptoms. He has had problems with bleeding on systemic anti-coagulation. He had his diuretic switched from lasix to torsemide.  Allergies  Allergen Reactions  . Bee Venom Anaphylaxis    Honey bee      Current Outpatient Medications  Medication Sig Dispense Refill  . acetaminophen (TYLENOL) 500 MG tablet Take 500 mg by mouth every 6 (six) hours as needed (for pain/headaches).    Marland Kitchen allopurinol (ZYLOPRIM) 100 MG tablet TAKE 2 TABLETS BY MOUTH DAILY FOR GOUT. 180 tablet 1  . aspirin EC 81 MG tablet Take 81 mg by mouth daily. Reported on 08/29/2015    . cetirizine (ZYRTEC) 10 MG tablet Take 10 mg by mouth daily as needed for allergies.    Marland Kitchen ENTRESTO 24-26 MG TAKE ONE TABLET BY MOUTH 2 TIMES A DAY. 60 tablet 6  . finasteride (PROSCAR) 5 MG tablet TAKE ONE TABLET BY MOUTH ONCE DAILY PROSTATE. 90 tablet 1  . metFORMIN (GLUCOPHAGE) 500 MG tablet TAKE ONE TABLET BY MOUTH DAILY FOR DIABETES. 90 tablet 1  . metoprolol succinate (TOPROL-XL) 50 MG 24 hr tablet Take 50 mg(1 tablet) in the morning and 25 mg(1/2 tablet) in the evening 135 tablet 3  . potassium chloride (K-DUR) 10 MEQ tablet TAKE 2 TABLETS BY MOUTH DAILY FOR POTASSIUM REPLACEMENT. 180 tablet 1  . simvastatin (ZOCOR) 40 MG tablet Take one tablet daily 90 tablet 1  . torsemide (DEMADEX) 20 MG tablet Take 1 tablet (20 mg total) by mouth daily. 90 tablet 3   No current facility-administered medications for this visit.      Past Medical History:  Diagnosis Date  . Arthritis    "right knee" (12/19/2015)  . CHF (congestive heart failure) ([place])    Abnormal echo January 2017  . History of colon polyps   . History of gout   . Hyperlipidemia   . Hypertension   . Myocardial  infarction [facility])    "they said I'd had a heart attack but didn't know it" (12/19/2015)  . Presence of permanent cardiac pacemaker   . Seasonal allergies   . Skin cancer    "arms/hands" (12/19/2015)  . Sleep apnea    supposed to wear CPAP, but doesn't (12/19/2015)  . Type II diabetes mellitus (HCC)     ROS:   All systems reviewed and negative except as noted in the HPI.   Past Surgical History:  Procedure Laterality Date  . CATARACT EXTRACTION W/PHACO Right 09/08/2012   Procedure: CATARACT EXTRACTION PHACO AND INTRAOCULAR LENS PLACEMENT (IOC);  Surgeon: Tonny Branch, MD;  Location: [facility] ORS;  Service: Ophthalmology;  Laterality: Right;  CDE: 21.28  . CATARACT EXTRACTION W/PHACO Left 12/08/2012   Procedure: CATARACT EXTRACTION PHACO AND INTRAOCULAR LENS PLACEMENT (IOC);  Surgeon: Tonny Branch, MD;  Location: [facility] ORS;  Service: Ophthalmology;  Laterality: Left;  CDE:25.72  . [place]  . COLONOSCOPY     4 procedures  . COLONOSCOPY  2012   Dr. Sharlett Iles: diverticulosis, lipoma in ascending colon.   Consuela Mimes WITH FULGERATION N/A 08/11/2017   Procedure: [place] OF PROSTATIC VARICES;  Surgeon: Cleon Gustin, MD;  Location: [facility] ORS;  Service: Urology;  Laterality: N/A;  . EP IMPLANTABLE DEVICE N/A 12/19/2015  Procedure: BiV Pacemaker Insertion CRT-P;  Surgeon: Evans Lance, MD;  Location: [place] CV LAB;  Service: Cardiovascular;  Laterality: N/A;  . INSERT / REPLACE / REMOVE PACEMAKER  12/19/2015  . JOINT REPLACEMENT Left    knee  . POLYPECTOMY    . TONSILLECTOMY    . TOTAL KNEE ARTHROPLASTY Left 2000s  . TRANSURETHRAL RESECTION OF PROSTATE    . YAG LASER APPLICATION Right [id number]   Procedure: YAG LASER APPLICATION;  Surgeon: [name], MD;  Location: [facility] ORS;  Service: Ophthalmology;  Laterality: Right;  . YAG LASER APPLICATION Left [id number]   Procedure: YAG LASER APPLICATION;  Surgeon: [name], MD;  Location: [facility] ORS;  Service:  Ophthalmology;  Laterality: Left;     Family History  Problem Relation Age of Onset  . Lung cancer Father   . Colon cancer Neg Hx      Social History   Socioeconomic History  . Marital status: Widowed    Spouse name: Not on file  . Number of children: Not on file  . Years of education: Not on file  . Highest education level: Not on file  Occupational History  . Not on file  Social Needs  . Financial resource strain: Not on file  . Food insecurity    Worry: Not on file    Inability: Not on file  . Transportation needs    Medical: Not on file    Non-medical: Not on file  Tobacco Use  . Smoking status: Never Smoker  . Smokeless tobacco: Never Used  Substance and Sexual Activity  . Alcohol use: Not Currently    Alcohol/week: 2.0 standard drinks    Types: 2 Glasses of wine per week  . Drug use: No  . Sexual activity: Not Currently    Birth control/protection: None  Lifestyle  . Physical activity    Days per week: Not on file    Minutes per session: Not on file  . Stress: Not on file  Relationships  . Social Herbalist on phone: Not on file    Gets together: Not on file    Attends religious service: Not on file    Active member of club or organization: Not on file    Attends meetings of clubs or organizations: Not on file    Relationship status: Not on file  . Intimate partner violence    Fear of current or ex partner: Not on file    Emotionally abused: Not on file    Physically abused: Not on file    Forced sexual activity: Not on file  Other Topics Concern  . Not on file  Social History Narrative  . Not on file     BP (!) 160/80   Pulse 62   Temp 97.8 F (36.6 C)   Ht 6' (1.829 m)   Wt 206 lb (93.4 kg)   SpO2 95%   BMI 27.94 kg/m   Physical Exam:  Well appearing 85 yo man, NAD HEENT: Unremarkable Neck:  No JVD, no thyromegally Lymphatics:  No adenopathy Back:  No CVA tenderness Lungs:  Clear HEART:  Regular rate rhythm, no  murmurs, no rubs, no clicks Abd:  soft, positive bowel sounds, no organomegally, no rebound, no guarding Ext:  2 plus pulses, no edema, no cyanosis, no clubbing Skin:  No rashes no nodules Neuro:  CN II through XII intact, motor grossly intact  DEVICE  Normal device function.  See PaceArt for details.  Assess/Plan: 1. Chronic systolic heart failure - his optivol looks ok but I suspect his volume is still a little too high. I have asked them to increase the toresemide to 40 bid. I have ordered digoxin. He will return to have his blood drawn. 2. Atrial fib - his VR is mostly controlled although he is not biv pacing as much as he would like.  3. biv PPM - his PM is working normally.   Anthony Lucas.D.

## 2018-12-08 NOTE — Telephone Encounter (Signed)
Please give pt's daughter Malachy Mood a call concerning new medications   [phone]

## 2018-12-12 NOTE — Telephone Encounter (Signed)
Stop lasix. Take torsemide.

## 2018-12-12 NOTE — Telephone Encounter (Signed)
Stop lasix and continue toresemide.

## 2018-12-13 NOTE — Telephone Encounter (Signed)
I relayed Dr.Taylor's message to daughter and patient will not take lasix, continue on torsemide

## 2018-12-23 ENCOUNTER — Other Ambulatory Visit (HOSPITAL_COMMUNITY)
Admission: RE | Admit: 2018-12-23 | Discharge: 2018-12-23 | Disposition: A | Discharge status: Home / Self Care | Payer: Medicare Other | Source: Ambulatory Visit | Attending: Internal Medicine | Admitting: Internal Medicine

## 2018-12-23 DIAGNOSIS — Z79899 Other long term (current) drug therapy: Secondary | ICD-10-CM | POA: Diagnosis not present

## 2018-12-23 DIAGNOSIS — I5022 Chronic systolic (congestive) heart failure: Secondary | ICD-10-CM | POA: Insufficient documentation

## 2018-12-23 LAB — BASIC METABOLIC PANEL
Anion gap: 10 (ref 5–15)
BUN: 37 mg/dL — ABNORMAL HIGH (ref 8–23)
CO2: 28 mmol/L (ref 22–32)
Calcium: 9 mg/dL (ref 8.9–10.3)
Chloride: 102 mmol/L (ref 98–111)
Creatinine, Ser: 1.52 mg/dL — ABNORMAL HIGH (ref 0.61–1.24)
GFR calc Af Amer: 48 mL/min — ABNORMAL LOW (ref >60)
GFR calc non Af Amer: 41 mL/min — ABNORMAL LOW (ref >60)
Glucose, Bld: 148 mg/dL — ABNORMAL HIGH (ref 70–99)
Potassium: 4.2 mmol/L (ref 3.5–5.1)
Sodium: 140 mmol/L (ref 135–145)

## 2018-12-23 LAB — DIGOXIN LEVEL: Digoxin Level: 0.7 ng/mL — ABNORMAL LOW (ref 0.8–2.0)

## 2018-12-28 ENCOUNTER — Encounter: Payer: Self-pay | Admitting: Cardiovascular Disease

## 2018-12-28 ENCOUNTER — Ambulatory Visit (INDEPENDENT_AMBULATORY_CARE_PROVIDER_SITE_OTHER): Payer: Medicare Other | Admitting: Cardiovascular Disease

## 2018-12-28 ENCOUNTER — Other Ambulatory Visit: Payer: Self-pay

## 2018-12-28 VITALS — BP 93/50 | HR 52 | Temp 96.9°F | Ht 73.0 in | Wt 196.0 lb

## 2018-12-28 DIAGNOSIS — Z79899 Other long term (current) drug therapy: Secondary | ICD-10-CM

## 2018-12-28 DIAGNOSIS — I10 Essential (primary) hypertension: Secondary | ICD-10-CM

## 2018-12-28 DIAGNOSIS — Z950 Presence of cardiac pacemaker: Secondary | ICD-10-CM

## 2018-12-28 DIAGNOSIS — I5022 Chronic systolic (congestive) heart failure: Secondary | ICD-10-CM

## 2018-12-28 DIAGNOSIS — E785 Hyperlipidemia, unspecified: Secondary | ICD-10-CM

## 2018-12-28 DIAGNOSIS — I519 Heart disease, unspecified: Secondary | ICD-10-CM | POA: Diagnosis not present

## 2018-12-28 DIAGNOSIS — I4811 Longstanding persistent atrial fibrillation: Secondary | ICD-10-CM

## 2018-12-28 DIAGNOSIS — I25118 Atherosclerotic heart disease of native coronary artery with other forms of angina pectoris: Secondary | ICD-10-CM

## 2018-12-28 NOTE — Patient Instructions (Signed)
Medication Instructions:  DECREASE TORSEMIDE TO 20 MG- TWO TIMES DAILY   Labwork: 2 WEEKS  BMET  Testing/Procedures: Your physician has requested that you have an echocardiogram. Echocardiography is a painless test that uses sound waves to create images of your heart. It provides your doctor with information about the size and shape of your heart and how well your heart's chambers and valves are working. This procedure takes approximately one hour. There are no restrictions for this procedure.    Follow-Up: Your physician recommends that you schedule a follow-up appointment in: 3 MONTHS    Any Other Special Instructions Will Be Listed Below (If Applicable).     If you need a refill on your cardiac medications before your next appointment, please call your pharmacy.

## 2018-12-28 NOTE — Progress Notes (Signed)
SUBJECTIVE: Patient presents for follow-up of chronic systolic heart failure.  Dr. Lovena Le increase torsemide to 40 mg twice daily and 12/08/2018.  He has a biventricular pacemaker.  He also has atrial fibrillation.  He is here with his daughter, Malachy Mood, who suffered 2 strokes after retiring from being an Metallurgist.  He had been feeling fatigued but is feeling somewhat better.  Chronic exertional dyspnea is stable.  He denies chest pain and leg swelling.  He has severe right knee arthritis.  Labs on 12/23/2018 showed BUN 37, creatinine 1.52.  Digoxin level was 0.7.  Social history: His wife of 61 years passed away in 07-15-17. They had been high school sweetheart's since the ninth grade.  His daughter, Malachy Mood, was an Metallurgist at [employer] high school recently retired.  She sustained 2 strokes afterwards.  His granddaughter, Fonnie Jarvis, is an actress and lives in [place] and appeared in the movie "[employer]".  Review of Systems: As per "subjective", otherwise negative.  Allergies  Allergen Reactions  . Bee Venom Anaphylaxis    Honey bee     Current Outpatient Medications  Medication Sig Dispense Refill  . acetaminophen (TYLENOL) 500 MG tablet Take 500 mg by mouth every 6 (six) hours as needed (for pain/headaches).    Marland Kitchen allopurinol (ZYLOPRIM) 100 MG tablet TAKE 2 TABLETS BY MOUTH DAILY FOR GOUT. 180 tablet 1  . aspirin EC 81 MG tablet Take 81 mg by mouth daily. Reported on 08/29/2015    . cetirizine (ZYRTEC) 10 MG tablet Take 10 mg by mouth daily as needed for allergies.    Marland Kitchen digoxin (LANOXIN) 0.125 MG tablet Take 1 tablet (0.125 mg total) by mouth daily. 90 tablet 3  . ENTRESTO 24-26 MG TAKE ONE TABLET BY MOUTH 2 TIMES A DAY. 60 tablet 6  . finasteride (PROSCAR) 5 MG tablet TAKE ONE TABLET BY MOUTH ONCE DAILY PROSTATE. 90 tablet 1  . metFORMIN (GLUCOPHAGE) 500 MG tablet TAKE ONE TABLET BY MOUTH DAILY FOR DIABETES. 90 tablet 1  . metoprolol succinate (TOPROL-XL) 50 MG 24 hr tablet  Take 50 mg(1 tablet) in the morning and 25 mg(1/2 tablet) in the evening 135 tablet 3  . potassium chloride (K-DUR) 10 MEQ tablet TAKE 2 TABLETS BY MOUTH DAILY FOR POTASSIUM REPLACEMENT. 180 tablet 1  . simvastatin (ZOCOR) 40 MG tablet Take one tablet daily 90 tablet 1  . torsemide (DEMADEX) 20 MG tablet Take 1 tablet (20 mg total) by mouth 2 (two) times daily. 180 tablet 3   No current facility-administered medications for this visit.     Past Medical History:  Diagnosis Date  . Arthritis    "right knee" (12/19/2015)  . CHF (congestive heart failure) ([place])    Abnormal echo January 2017  . History of colon polyps   . History of gout   . Hyperlipidemia   . Hypertension   . Myocardial infarction [facility])    "they said I'd had a heart attack but didn't know it" (12/19/2015)  . Presence of permanent cardiac pacemaker   . Seasonal allergies   . Skin cancer    "arms/hands" (12/19/2015)  . Sleep apnea    supposed to wear CPAP, but doesn't (12/19/2015)  . Type II diabetes mellitus ([place])     Past Surgical History:  Procedure Laterality Date  . CATARACT EXTRACTION W/PHACO Right 09/08/2012   Procedure: CATARACT EXTRACTION PHACO AND INTRAOCULAR LENS PLACEMENT (IOC);  Surgeon: Tonny Branch, MD;  Location: [facility];  Service: Ophthalmology;  Laterality: Right;  CDE: 21.28  . CATARACT EXTRACTION W/PHACO Left 12/08/2012   Procedure: CATARACT EXTRACTION PHACO AND INTRAOCULAR LENS PLACEMENT (IOC);  Surgeon: Tonny Branch, MD;  Location: [facility];  Service: Ophthalmology;  Laterality: Left;  CDE:25.72  . [place]  . COLONOSCOPY     4 procedures  . COLONOSCOPY  2012   Dr. Sharlett Iles: diverticulosis, lipoma in ascending colon.   Consuela Mimes WITH FULGERATION N/A 08/11/2017   Procedure: [place] OF PROSTATIC VARICES;  Surgeon: Cleon Gustin, MD;  Location: [facility];  Service: Urology;  Laterality: N/A;  . EP IMPLANTABLE DEVICE N/A 12/19/2015   Procedure: BiV Pacemaker Insertion  CRT-P;  Surgeon: Evans Lance, MD;  Location: [place] CV LAB;  Service: Cardiovascular;  Laterality: N/A;  . INSERT / REPLACE / REMOVE PACEMAKER  12/19/2015  . JOINT REPLACEMENT Left    knee  . POLYPECTOMY    . TONSILLECTOMY    . TOTAL KNEE ARTHROPLASTY Left 2000s  . TRANSURETHRAL RESECTION OF PROSTATE    . YAG LASER APPLICATION Right [id number]   Procedure: YAG LASER APPLICATION;  Surgeon: [name], MD;  Location: [facility];  Service: Ophthalmology;  Laterality: Right;  . YAG LASER APPLICATION Left [id number]   Procedure: YAG LASER APPLICATION;  Surgeon: [name], MD;  Location: [facility];  Service: Ophthalmology;  Laterality: Left;    Social History   Socioeconomic History  . Marital status: Widowed    Spouse name: Not on file  . Number of children: Not on file  . Years of education: Not on file  . Highest education level: Not on file  Occupational History  . Not on file  Social Needs  . Financial resource strain: Not on file  . Food insecurity    Worry: Not on file    Inability: Not on file  . Transportation needs    Medical: Not on file    Non-medical: Not on file  Tobacco Use  . Smoking status: Never Smoker  . Smokeless tobacco: Never Used  Substance and Sexual Activity  . Alcohol use: Not Currently    Alcohol/week: 2.0 standard drinks    Types: 2 Glasses of wine per week  . Drug use: No  . Sexual activity: Not Currently    Birth control/protection: None  Lifestyle  . Physical activity    Days per week: Not on file    Minutes per session: Not on file  . Stress: Not on file  Relationships  . Social Herbalist on phone: Not on file    Gets together: Not on file    Attends religious service: Not on file    Active member of club or organization: Not on file    Attends meetings of clubs or organizations: Not on file    Relationship status: Not on file  . Intimate partner violence    Fear of current or ex partner: Not on file    Emotionally  abused: Not on file    Physically abused: Not on file    Forced sexual activity: Not on file  Other Topics Concern  . Not on file  Social History Narrative  . Not on file     Vitals:   12/28/18 1255  BP: (!) 93/50  Pulse: (!) 52  Temp: (!) 96.9 F (36.1 C)  Weight: 196 lb (88.9 kg)  Height: 6\' 1"  (1.854 m)    Wt Readings from Last 3 Encounters:  12/28/18 196 lb (  88.9 kg)  12/08/18 206 lb (93.4 kg)  10/26/18 203 lb (92.1 kg)     PHYSICAL EXAM General: NAD HEENT: Normal. Neck: No JVD, no thyromegaly. Lungs: Clear to auscultation bilaterally with normal respiratory effort. CV: Regular rate and rhythm, normal S1/S2, no S3/S4, no murmur. No pretibial or periankle edema.   Abdomen: Soft, nontender, no distention.  Neurologic: Alert and oriented.  Psych: Normal affect. Skin: Normal. Musculoskeletal: No gross deformities.      Labs: Lab Results  Component Value Date/Time   K 4.2 12/23/2018 10:04 AM   BUN 37 (H) 12/23/2018 10:04 AM   BUN 15 10/12/2018 04:23 PM   CREATININE 1.52 (H) 12/23/2018 10:04 AM   CREATININE 1.17 (H) 12/12/2015 10:48 AM   ALT 14 01/26/2018 08:57 AM   TSH 1.960 04/26/2015 09:42 AM   HGB 12.7 (L) 08/10/2017 03:18 PM   HGB 13.5 09/11/2015 03:07 PM     Lipids: Lab Results  Component Value Date/Time   LDLCALC 75 01/26/2018 08:57 AM   CHOL 148 01/26/2018 08:57 AM   TRIG 103 01/26/2018 08:57 AM   HDL 52 01/26/2018 08:57 AM       ASSESSMENT AND PLAN: 1. Chronic systolic heart failure: He has severe left ventricular systolic dysfunction and a biventricular pacemaker: Stable NYHA class II symptoms. No changes to therapy. Continue torsemide (I will reduce the dose to 20 mg twice daily), digoxin, Toprol-XL, and Entresto.  I will obtain a basic metabolic panel in 2 weeks. I will order a 2-D echocardiogram with Doppler to evaluate cardiac structure, function, and regional wall motion.  2. Hypertension:  Blood pressure is low.  I am reducing  torsemide.  3. Hyperlipidemia: Continue simvastatin 40 mg.  4. Coronary artery disease: Symptomatically stable.Nuclear stress test on 05/16/2015 demonstrated a moderate sized area of myocardial scar in the inferior and inferoseptal walls suggestive of prior myocardial infarction. There was no ischemia. He denies anginal symptoms. Continue aspirin(he takes it intermittently due to hematuria), beta blocker, and statin.  5.  Biventricular pacemaker: Functioning normally.  Followed by Dr. Lovena Le.     Disposition: Follow up 3 months   Kate Sable, M.D., F.A.C.C.

## 2018-12-29 ENCOUNTER — Telehealth: Payer: Self-pay | Admitting: Cardiovascular Disease

## 2018-12-29 NOTE — Telephone Encounter (Signed)
Daughter called to say patient never increased Torsemide to 40 mg bid, he has stayed on 20 mg bid.dr.Taylor's  note of 12/08/2018 indicated increase to 40 mg bid but their AVS said take 20 mg bid.Your visit yesterday requested he decrease to 20 mg bid, which is is already taking.Do you want to change anything?

## 2018-12-29 NOTE — Telephone Encounter (Signed)
I called and spoke with the patient's daughter who was present at his appointment.  She and her husband has since moved in with her father to help take care of him. I clarified that he should be taking torsemide 20 mg twice daily. She had several questions about her father's diet.  He likes to eat out and have barbecue and other restaurant foods which are high in sodium.  She requested some guidance as she will not be doing his meal preparation, although she says he would really prefer to eat these foods.  I informed her that foods high in sodium could very quickly precipitate CHF and she is understanding of this and will explain this to her father. He has not been taking aspirin but continues to have hematuria.  I recommended she make an appointment with her father's urologist.

## 2018-12-29 NOTE — Telephone Encounter (Signed)
Daughter told to stay on torsemide 20 mg bid, agrees with plan, made apt for dad with urologist first week in October

## 2018-12-29 NOTE — Telephone Encounter (Signed)
Please give pt's daughter Malachy Mood a call concerning pt's Toresemide.   [phone]

## 2019-01-06 ENCOUNTER — Other Ambulatory Visit: Payer: Self-pay

## 2019-01-06 ENCOUNTER — Ambulatory Visit (HOSPITAL_COMMUNITY)
Admission: RE | Admit: 2019-01-06 | Discharge: 2019-01-06 | Disposition: A | Discharge status: Home / Self Care | Payer: Medicare Other | Source: Ambulatory Visit | Attending: Cardiovascular Disease | Admitting: Cardiovascular Disease

## 2019-01-06 DIAGNOSIS — I5022 Chronic systolic (congestive) heart failure: Secondary | ICD-10-CM | POA: Diagnosis present

## 2019-01-06 NOTE — Progress Notes (Signed)
*  PRELIMINARY RESULTS* Echocardiogram 2D Echocardiogram has been performed.  Leavy Cella 01/06/2019, 2:55 PM

## 2019-01-10 DIAGNOSIS — E1142 Type 2 diabetes mellitus with diabetic polyneuropathy: Secondary | ICD-10-CM | POA: Diagnosis not present

## 2019-01-10 DIAGNOSIS — B351 Tinea unguium: Secondary | ICD-10-CM | POA: Diagnosis not present

## 2019-01-11 DIAGNOSIS — R310 Gross hematuria: Secondary | ICD-10-CM | POA: Diagnosis not present

## 2019-01-11 DIAGNOSIS — R3912 Poor urinary stream: Secondary | ICD-10-CM | POA: Diagnosis not present

## 2019-01-11 DIAGNOSIS — N3000 Acute cystitis without hematuria: Secondary | ICD-10-CM | POA: Diagnosis not present

## 2019-01-11 DIAGNOSIS — N401 Enlarged prostate with lower urinary tract symptoms: Secondary | ICD-10-CM | POA: Diagnosis not present

## 2019-01-17 ENCOUNTER — Ambulatory Visit (INDEPENDENT_AMBULATORY_CARE_PROVIDER_SITE_OTHER): Payer: Medicare Other | Admitting: *Deleted

## 2019-01-17 DIAGNOSIS — I5022 Chronic systolic (congestive) heart failure: Secondary | ICD-10-CM

## 2019-01-17 DIAGNOSIS — I519 Heart disease, unspecified: Secondary | ICD-10-CM

## 2019-01-18 LAB — CUP PACEART REMOTE DEVICE CHECK
Battery Remaining Longevity: 23 mo
Battery Voltage: 2.92 V
Brady Statistic AP VP Percent: 0 %
Brady Statistic AP VS Percent: 0 %
Brady Statistic AS VP Percent: 0 %
Brady Statistic AS VS Percent: 0 %
Brady Statistic RA Percent Paced: 0.15 %
Brady Statistic RV Percent Paced: 98.7 %
Date Time Interrogation Session: 20201013053623
Implantable Lead Implant Date: 20170914
Implantable Lead Implant Date: 20170914
Implantable Lead Implant Date: 20170914
Implantable Lead Location: 753858
Implantable Lead Location: 753859
Implantable Lead Location: 753860
Implantable Lead Model: 4598
Implantable Lead Model: 5076
Implantable Lead Model: 5076
Implantable Pulse Generator Implant Date: 20170914
Lead Channel Impedance Value: 323 Ohm
Lead Channel Impedance Value: 456 Ohm
Lead Channel Impedance Value: 456 Ohm
Lead Channel Impedance Value: 494 Ohm
Lead Channel Impedance Value: 532 Ohm
Lead Channel Impedance Value: 532 Ohm
Lead Channel Impedance Value: 551 Ohm
Lead Channel Impedance Value: 551 Ohm
Lead Channel Impedance Value: 589 Ohm
Lead Channel Impedance Value: 760 Ohm
Lead Channel Impedance Value: 817 Ohm
Lead Channel Impedance Value: 817 Ohm
Lead Channel Impedance Value: 855 Ohm
Lead Channel Impedance Value: 893 Ohm
Lead Channel Pacing Threshold Amplitude: 0.5 V
Lead Channel Pacing Threshold Amplitude: 0.625 V
Lead Channel Pacing Threshold Amplitude: 4 V
Lead Channel Pacing Threshold Pulse Width: 0.4 ms
Lead Channel Pacing Threshold Pulse Width: 0.4 ms
Lead Channel Pacing Threshold Pulse Width: 1 ms
Lead Channel Sensing Intrinsic Amplitude: 1.375 mV
Lead Channel Sensing Intrinsic Amplitude: 1.375 mV
Lead Channel Sensing Intrinsic Amplitude: 7.125 mV
Lead Channel Sensing Intrinsic Amplitude: 7.125 mV
Lead Channel Setting Pacing Amplitude: 2 V
Lead Channel Setting Pacing Amplitude: 2.5 V
Lead Channel Setting Pacing Amplitude: 4 V
Lead Channel Setting Pacing Pulse Width: 0.4 ms
Lead Channel Setting Pacing Pulse Width: 1 ms
Lead Channel Setting Sensing Sensitivity: 2.8 mV

## 2019-01-26 ENCOUNTER — Telehealth: Payer: Self-pay | Admitting: Cardiovascular Disease

## 2019-01-26 MED ORDER — METOPROLOL SUCCINATE ER 25 MG PO TB24: 25.0000 mg | ORAL_TABLET | Freq: Two times a day (BID) | ORAL | 2 refills | Status: DC

## 2019-01-26 MED ORDER — ENTRESTO 24-26 MG PO TABS: ORAL_TABLET | ORAL | 6 refills | Status: DC

## 2019-01-26 NOTE — Telephone Encounter (Signed)
Hold Entresto and switch Toprol-XL to 25 mg twice daily.

## 2019-01-26 NOTE — Telephone Encounter (Signed)
Daughter informed and verbalized understanding of plan. 

## 2019-01-26 NOTE — Telephone Encounter (Signed)
Patient still in AFIB - they have cut his sodium intake down but the patient is telling his daughter they may have cut too much out of his diet.   98/49 pulse rate 50

## 2019-01-26 NOTE — Telephone Encounter (Signed)
Per daughter, patient reports lightheadedness, dizziness, and has no energy. Today BP 98/48 & HR 50. Daughter reports that BP has been ranging around 90's/50's & HR in mid 50's  Patient reports symptoms improving after eating salt Denies chest pain or sob. Medications reconciled and advised daughter that message would be sent to Dr. Bronson Ing.

## 2019-01-27 NOTE — Progress Notes (Signed)
Remote pacemaker transmission.   

## 2019-02-10 DIAGNOSIS — Z23 Encounter for immunization: Secondary | ICD-10-CM | POA: Diagnosis not present

## 2019-03-01 ENCOUNTER — Other Ambulatory Visit: Payer: Self-pay

## 2019-03-07 ENCOUNTER — Ambulatory Visit (INDEPENDENT_AMBULATORY_CARE_PROVIDER_SITE_OTHER): Payer: Medicare Other | Admitting: Urology

## 2019-03-07 DIAGNOSIS — N3000 Acute cystitis without hematuria: Secondary | ICD-10-CM

## 2019-03-07 DIAGNOSIS — N401 Enlarged prostate with lower urinary tract symptoms: Secondary | ICD-10-CM

## 2019-03-08 ENCOUNTER — Other Ambulatory Visit: Payer: Self-pay | Admitting: Family Medicine

## 2019-03-08 NOTE — Telephone Encounter (Signed)
Please contact patient to schedule appt. Will send in one refill

## 2019-03-08 NOTE — Telephone Encounter (Signed)
1 refill Patient needs to do a virtual visit follow-up

## 2019-03-27 ENCOUNTER — Telehealth: Payer: Self-pay | Admitting: Cardiovascular Disease

## 2019-03-27 NOTE — Telephone Encounter (Signed)
Virtual Visit Pre-Appointment Phone Call  "(Name), I am calling you today to discuss your upcoming appointment. We are currently trying to limit exposure to the virus that causes COVID-19 by seeing patients at home rather than in the office."  1. "What is the BEST phone number to call the day of the visit?" - [phone]  2. "Do you have or have access to (through a family member/friend) a smartphone with video capability that we can use for your visit?" a. If yes - list this number in appt notes as "cell" (if different from BEST phone #) and list the appointment type as a VIDEO visit in appointment notes b. If no - list the appointment type as a PHONE visit in appointment notes  3. Confirm consent - "In the setting of the current Covid19 crisis, you are scheduled for a (phone or video) visit with your provider on (date) at (time).  Just as we do with many in-office visits, in order for you to participate in this visit, we must obtain consent.  If you'd like, I can send this to your mychart (if signed up) or email for you to review.  Otherwise, I can obtain your verbal consent now.  All virtual visits are billed to your insurance company just like a normal visit would be.  By agreeing to a virtual visit, we'd like you to understand that the technology does not allow for your provider to perform an examination, and thus may limit your provider's ability to fully assess your condition. If your provider identifies any concerns that need to be evaluated in person, we will make arrangements to do so.  Finally, though the technology is pretty good, we cannot assure that it will always work on either your or our end, and in the setting of a video visit, we may have to convert it to a phone-only visit.  In either situation, we cannot ensure that we have a secure connection.  Are you willing to proceed?" STAFF: Did the patient verbally acknowledge consent to telehealth visit? Document YES/NO here:  YES  4. Advise patient to be prepared - "Two hours prior to your appointment, go ahead and check your blood pressure, pulse, oxygen saturation, and your weight (if you have the equipment to check those) and write them all down. When your visit starts, your provider will ask you for this information. If you have an Apple Watch or Kardia device, please plan to have heart rate information ready on the day of your appointment. Please have a pen and paper handy nearby the day of the visit as well."  5. Give patient instructions for MyChart download to smartphone OR Doximity/Doxy.me as below if video visit (depending on what platform provider is using)  6. Inform patient they will receive a phone call 15 minutes prior to their appointment time (may be from unknown caller ID) so they should be prepared to answer    TELEPHONE CALL NOTE  Anthony Lucas has been deemed a candidate for a follow-up tele-health visit to limit community exposure during the Covid-19 pandemic. I spoke with the patient via phone to ensure availability of phone/video source, confirm preferred email & phone number, and discuss instructions and expectations.  I reminded Anthony Lucas to be prepared with any vital sign and/or heart rhythm information that could potentially be obtained via home monitoring, at the time of his visit. I reminded Anthony Lucas to expect a phone call prior to his visit.  Vicky  T Slaughter 03/27/2019 10:21 AM   INSTRUCTIONS FOR DOWNLOADING THE MYCHART APP TO SMARTPHONE  - The patient must first make sure to have activated MyChart and know their login information - If Apple, go to [employer] and type in MyChart in the search bar and download the app. If Android, ask patient to go to [employer] and type in [place] in the search bar and download the app. The app is free but as with any other app downloads, their phone may require them to verify saved payment information or Apple/Android password.  - The  patient will need to then log into the app with their MyChart username and password, and select [facility] as their healthcare provider to link the account. When it is time for your visit, go to the MyChart app, find appointments, and click Begin Video Visit. Be sure to Select Allow for your device to access the Microphone and Camera for your visit. You will then be connected, and your provider will be with you shortly.  **If they have any issues connecting, or need assistance please contact MyChart service desk [phone])**  **If using a computer, in order to ensure the best quality for their visit they will need to use either of the following Internet Browsers: [employer], or Google Chrome**  IF USING DOXIMITY or DOXY.ME - The patient will receive a link just prior to their visit by text.     FULL LENGTH CONSENT FOR TELE-HEALTH VISIT   I hereby voluntarily request, consent and authorize [facility] and its employed or contracted physicians, physician assistants, nurse practitioners or other licensed health care professionals (the Practitioner), to provide me with telemedicine health care services (the "Services") as deemed necessary by the treating Practitioner. I acknowledge and consent to receive the Services by the Practitioner via telemedicine. I understand that the telemedicine visit will involve communicating with the Practitioner through live audiovisual communication technology and the disclosure of certain medical information by electronic transmission. I acknowledge that I have been given the opportunity to request an in-person assessment or other available alternative prior to the telemedicine visit and am voluntarily participating in the telemedicine visit.  I understand that I have the right to withhold or withdraw my consent to the use of telemedicine in the course of my care at any time, without affecting my right to future care or treatment, and that the  Practitioner or I may terminate the telemedicine visit at any time. I understand that I have the right to inspect all information obtained and/or recorded in the course of the telemedicine visit and may receive copies of available information for a reasonable fee.  I understand that some of the potential risks of receiving the Services via telemedicine include:  Marland Kitchen Delay or interruption in medical evaluation due to technological equipment failure or disruption; . Information transmitted may not be sufficient (e.g. poor resolution of images) to allow for appropriate medical decision making by the Practitioner; and/or  . In rare instances, security protocols could fail, causing a breach of personal health information.  Furthermore, I acknowledge that it is my responsibility to provide information about my medical history, conditions and care that is complete and accurate to the best of my ability. I acknowledge that Practitioner's advice, recommendations, and/or decision may be based on factors not within their control, such as incomplete or inaccurate data provided by me or distortions of diagnostic images or specimens that may result from electronic transmissions. I understand that the practice  of medicine is not an Chief Strategy Officer and that Practitioner makes no warranties or guarantees regarding treatment outcomes. I acknowledge that I will receive a copy of this consent concurrently upon execution via email to the email address I last provided but may also request a printed copy by calling the office of [facility].    I understand that my insurance will be billed for this visit.   I have read or had this consent read to me. . I understand the contents of this consent, which adequately explains the benefits and risks of the Services being provided via telemedicine.  . I have been provided ample opportunity to ask questions regarding this consent and the Services and have had my questions answered to my  satisfaction. . I give my informed consent for the services to be provided through the use of telemedicine in my medical care  By participating in this telemedicine visit I agree to the above.

## 2019-03-28 ENCOUNTER — Telehealth: Payer: Self-pay | Admitting: Urology

## 2019-03-28 NOTE — Telephone Encounter (Signed)
Patient was last seen on 03/07/19 with Dr. Diona Fanti for infection.  Dr. Diona Fanti prescribed abx for the patient.  Patient reports that the symptoms resolved, but now experiencing same symptoms again.   Daughter wants to know if we can refill the abx or does he need to come back to the office to provide another urine specimen.  Please call patient's daughter to discuss.  She can be reached at [phone].

## 2019-03-28 NOTE — Telephone Encounter (Signed)
Per Dr. Diona Fanti okay for pt to come in and drop off specimen. Daughter notified. Pt will come tomorrow am.

## 2019-03-29 ENCOUNTER — Ambulatory Visit: Payer: Medicare Other | Admitting: Urology

## 2019-03-29 ENCOUNTER — Other Ambulatory Visit (HOSPITAL_COMMUNITY)
Admission: RE | Admit: 2019-03-29 | Discharge: 2019-03-29 | Disposition: A | Discharge status: Home / Self Care | Payer: Medicare Other | Source: Ambulatory Visit | Attending: Urology | Admitting: Urology

## 2019-03-29 ENCOUNTER — Encounter: Payer: Self-pay | Admitting: Cardiovascular Disease

## 2019-03-29 ENCOUNTER — Other Ambulatory Visit: Payer: Self-pay

## 2019-03-29 ENCOUNTER — Telehealth (INDEPENDENT_AMBULATORY_CARE_PROVIDER_SITE_OTHER): Payer: Medicare Other | Admitting: Cardiovascular Disease

## 2019-03-29 VITALS — BP 159/72 | HR 85 | Ht 73.5 in | Wt 195.0 lb

## 2019-03-29 DIAGNOSIS — I10 Essential (primary) hypertension: Secondary | ICD-10-CM

## 2019-03-29 DIAGNOSIS — I4811 Longstanding persistent atrial fibrillation: Secondary | ICD-10-CM

## 2019-03-29 DIAGNOSIS — Z950 Presence of cardiac pacemaker: Secondary | ICD-10-CM

## 2019-03-29 DIAGNOSIS — N390 Urinary tract infection, site not specified: Secondary | ICD-10-CM

## 2019-03-29 DIAGNOSIS — I5022 Chronic systolic (congestive) heart failure: Secondary | ICD-10-CM | POA: Diagnosis not present

## 2019-03-29 DIAGNOSIS — I25118 Atherosclerotic heart disease of native coronary artery with other forms of angina pectoris: Secondary | ICD-10-CM

## 2019-03-29 DIAGNOSIS — Z79899 Other long term (current) drug therapy: Secondary | ICD-10-CM

## 2019-03-29 DIAGNOSIS — E785 Hyperlipidemia, unspecified: Secondary | ICD-10-CM

## 2019-03-29 NOTE — Progress Notes (Signed)
Virtual Visit via Telephone Note   This visit type was conducted due to national recommendations for restrictions regarding the COVID-19 Pandemic (e.g. social distancing) in an effort to limit this patient's exposure and mitigate transmission in our community.  Due to his co-morbid illnesses, this patient is at least at moderate risk for complications without adequate follow up.  This format is felt to be most appropriate for this patient at this time.  The patient did not have access to video technology/had technical difficulties with video requiring transitioning to audio format only (telephone).  All issues noted in this document were discussed and addressed.  No physical exam could be performed with this format.  Please refer to the patient's chart for his  consent to telehealth for [facility].   Date:  03/29/2019   ID:  Anthony Lucas, DOB 1930-06-21, MRN [id number]  Patient Location: Home Provider Location: Office  PCP:  Kathyrn Drown, MD  Cardiologist:  Kate Sable, MD  Electrophysiologist:  None   Evaluation Performed:  Follow-Up Visit  Chief Complaint:  CHF  History of Present Illness:    Anthony Lucas is a 85 y.o. male with a history of chronic systolic heart failure and atrial fibrillation.  He has a biventricular pacemaker.  His daughter, Malachy Mood, takes care of him along with her husband as they have both moved in with him.  He likes to eat out and eat barbecue and other foods which are high in sodium.  Entresto led to symptomatic hypotension so this had to be stopped.  He denies chest pain and shortness of breath as well as leg swelling.  He does have occasional palpitations.  I spoke with his daughter, Malachy Mood, as well.  She said her father is doing much better.  He does have a UTI and is being treated by urology.  He is currently on an antibiotic.   Social history: His wife of 36 years passed away in 07-07-17. They had been high school sweetheart's since the  ninth grade. His daughter, Malachy Mood, was an Metallurgist at [employer].  She sustained 2 strokes after retirement. His granddaughter, AshleighFalls,is an actress and lives in [place] and appeared in the movie "[employer]".  Past Medical History:  Diagnosis Date  . Arthritis    "right knee" (12/19/2015)  . CHF (congestive heart failure) ([place])    Abnormal echo January 2017  . History of colon polyps   . History of gout   . Hyperlipidemia   . Hypertension   . Myocardial infarction [facility])    "they said I'd had a heart attack but didn't know it" (12/19/2015)  . Presence of permanent cardiac pacemaker   . Seasonal allergies   . Skin cancer    "arms/hands" (12/19/2015)  . Sleep apnea    supposed to wear CPAP, but doesn't (12/19/2015)  . Type II diabetes mellitus ([place])    Past Surgical History:  Procedure Laterality Date  . CATARACT EXTRACTION W/PHACO Right 09/08/2012   Procedure: CATARACT EXTRACTION PHACO AND INTRAOCULAR LENS PLACEMENT (IOC);  Surgeon: Tonny Branch, MD;  Location: [facility];  Service: Ophthalmology;  Laterality: Right;  CDE: 21.28  . CATARACT EXTRACTION W/PHACO Left 12/08/2012   Procedure: CATARACT EXTRACTION PHACO AND INTRAOCULAR LENS PLACEMENT (IOC);  Surgeon: Tonny Branch, MD;  Location: [facility];  Service: Ophthalmology;  Laterality: Left;  CDE:25.72  . [place]  . COLONOSCOPY     4 procedures  . COLONOSCOPY  Jul 08, 2010   Dr. Sharlett Iles:  diverticulosis, lipoma in ascending colon.   Consuela Mimes WITH FULGERATION N/A 08/11/2017   Procedure: [place] OF PROSTATIC VARICES;  Surgeon: Cleon Gustin, MD;  Location: [facility];  Service: Urology;  Laterality: N/A;  . EP IMPLANTABLE DEVICE N/A 12/19/2015   Procedure: BiV Pacemaker Insertion CRT-P;  Surgeon: Evans Lance, MD;  Location: [facility] CV [facility];  Service: Cardiovascular;  Laterality: N/A;  . INSERT / REPLACE / REMOVE PACEMAKER  12/19/2015  . JOINT REPLACEMENT Left    knee  . POLYPECTOMY     . TONSILLECTOMY    . TOTAL KNEE ARTHROPLASTY Left 2000s  . TRANSURETHRAL RESECTION OF PROSTATE    . YAG LASER APPLICATION Right [id number]   Procedure: YAG LASER APPLICATION;  Surgeon: [name], MD;  Location: [facility];  Service: Ophthalmology;  Laterality: Right;  . YAG LASER APPLICATION Left [id number]   Procedure: YAG LASER APPLICATION;  Surgeon: [name], MD;  Location: [facility];  Service: Ophthalmology;  Laterality: Left;     Current Meds  Medication Sig  . acetaminophen (TYLENOL) 500 MG tablet Take 500 mg by mouth every 6 (six) hours as needed (for pain/headaches).  Marland Kitchen allopurinol (ZYLOPRIM) 100 MG tablet TAKE 2 TABLETS BY MOUTH DAILY FOR GOUT.  Marland Kitchen aspirin EC 81 MG tablet Take 81 mg by mouth daily. Reported on 08/29/2015  . cetirizine (ZYRTEC) 10 MG tablet Take 10 mg by mouth daily as needed for allergies.  Marland Kitchen digoxin (LANOXIN) 0.125 MG tablet Take 1 tablet (0.125 mg total) by mouth daily.  . finasteride (PROSCAR) 5 MG tablet TAKE ONE TABLET BY MOUTH ONCE DAILY PROSTATE.  . metFORMIN (GLUCOPHAGE) 500 MG tablet TAKE ONE TABLET BY MOUTH DAILY FOR DIABETES.  . metoprolol succinate (TOPROL XL) 25 MG 24 hr tablet Take 1 tablet (25 mg total) by mouth 2 (two) times daily.  . potassium chloride (KLOR-CON) 10 MEQ tablet TAKE 2 TABLETS BY MOUTH DAILY FOR POTASSIUM REPLACEMENT.  Marland Kitchen simvastatin (ZOCOR) 40 MG tablet Take one tablet daily  . torsemide (DEMADEX) 20 MG tablet Take 1 tablet (20 mg total) by mouth 2 (two) times daily.     Allergies:   Bee venom   Social History   Tobacco Use  . Smoking status: Never Smoker  . Smokeless tobacco: Never Used  Substance Use Topics  . Alcohol use: Not Currently    Alcohol/week: 2.0 standard drinks    Types: 2 Glasses of wine per week  . Drug use: No     Family Hx: The patient's family history includes Lung cancer in his father. There is no history of Colon cancer.  ROS:   Please see the history of present illness.     All other systems  reviewed and are negative.   Prior CV studies:   The following studies were reviewed today:  Echocardiogram 01/06/2019   1. Left ventricular ejection fraction, by visual estimation, is 50 to 55%. The left ventricle has normal function. There is mildly increased left ventricular hypertrophy.  2. Left ventricular diastolic Doppler parameters are indeterminate pattern of LV diastolic filling.  3. Global right ventricle has normal systolic function.The right ventricular size is normal. Moderately increased right ventricular wall thickness.  4. Left atrial size was normal.  5. Right atrial size was moderately dilated.  6. The mitral valve is normal in structure. No evidence of mitral valve regurgitation. No evidence of mitral stenosis.  7. The tricuspid valve is normal in structure. Tricuspid valve regurgitation is trivial.  8. The  aortic valve has an indeterminant number of cusps Aortic valve regurgitation was not visualized by color flow Doppler. Mild to moderate aortic valve sclerosis/calcification without any evidence of aortic stenosis.  9. The pulmonic valve was not well visualized. Pulmonic valve regurgitation is not visualized by color flow Doppler. 10. Mildly elevated pulmonary artery systolic pressure. 11. The inferior vena cava is normal in size with greater than 50% respiratory variability, suggesting right atrial pressure of 3 mmHg. 12. The interatrial septum was not well visualized.   Labs/Other Tests and Data Reviewed:    EKG:  No ECG reviewed.  Recent Labs: 10/12/2018: BNP 373.1 12/23/2018: BUN 37; Creatinine, Ser 1.52; Potassium 4.2; Sodium 140   Recent Lipid Panel Lab Results  Component Value Date/Time   CHOL 148 01/26/2018 08:57 AM   TRIG 103 01/26/2018 08:57 AM   HDL 52 01/26/2018 08:57 AM   CHOLHDL 2.8 01/26/2018 08:57 AM   CHOLHDL 3.4 01/23/2014 08:41 AM   LDLCALC 75 01/26/2018 08:57 AM    Wt Readings from Last 3 Encounters:  03/29/19 195 lb (88.5 kg)   12/28/18 196 lb (88.9 kg)  12/08/18 206 lb (93.4 kg)     Objective:    Vital Signs:  BP (!) 159/72   Pulse 85   Ht 6' 1.5" (1.867 m)   Wt 195 lb (88.5 kg)   BMI 25.38 kg/m    VITAL SIGNS:  reviewed  ASSESSMENT & PLAN:    1. Chronic systolic heart failure: Echocardiogram from October 2020 reviewed above demonstrating normalization of LV systolic function, EF 50 to 55%.  Stable NYHA class II symptoms. No changes to therapy. Continue torsemide 20 mg twice daily, digoxin, and Toprol-XL.  Entresto led to symptomatic hypotension so this had to be stopped.   He is now hypertensive. He was active just prior to it being checked.    2. Hypertension: Blood pressure is elevated. He was active just prior to it being checked.  This will need continued monitoring.  Entresto previously led to symptomatic hypotension.  3. Hyperlipidemia: Continue simvastatin 40 mg.  4. Coronary artery disease: Symptomatically stable.Nuclear stress test on 05/16/2015 demonstrated a moderate sized area of myocardial scar in the inferior and inferoseptal walls suggestive of prior myocardial infarction. There was no ischemia. He denies anginal symptoms. Continue aspirin(he takes it intermittently due to hematuria), beta blocker, and statin.  5.  Biventricular pacemaker: Functioning normally.  Followed by Dr. Lovena Le.  6.  Persistent atrial fibrillation: He is not on anticoagulation due to bleeding issues. He takes ASA.    COVID-19 Education: The signs and symptoms of COVID-19 were discussed with the patient and how to seek care for testing (follow up with PCP or arrange E-visit).  The importance of social distancing was discussed today.  Time:   Today, I have spent 15 minutes with the patient with telehealth technology discussing the above problems.     Medication Adjustments/Labs and Tests Ordered: Current medicines are reviewed at length with the patient today.  Concerns regarding medicines are outlined  above.   Tests Ordered: No orders of the defined types were placed in this encounter.   Medication Changes: No orders of the defined types were placed in this encounter.   Follow Up:  Virtual Visit  in 6 month(s)  Signed, Kate Sable, MD  03/29/2019 10:36 AM    [place]

## 2019-03-29 NOTE — Patient Instructions (Signed)
Medication Instructions:  Your physician recommends that you continue on your current medications as directed. Please refer to the Current Medication list given to you today.  *If you need a refill on your cardiac medications before your next appointment, please call your pharmacy*  Lab Work: None today If you have labs (blood work) drawn today and your tests are completely normal, you will receive your results only by: . MyChart Message (if you have MyChart) OR . A paper copy in the mail If you have any lab test that is abnormal or we need to change your treatment, we will call you to review the results.  Testing/Procedures: None today  Follow-Up: At [facility], you and your health needs are our priority.  As part of our continuing mission to provide you with exceptional heart care, we have created designated Provider Care Teams.  These Care Teams include your primary Cardiologist (physician) and Advanced Practice Providers (APPs -  Physician Assistants and Nurse Practitioners) who all work together to provide you with the care you need, when you need it.  Your next appointment:   6 month(s)  The format for your next appointment:   Virtual Visit   Provider:   Suresh Koneswaran, MD  Other Instructions None     Thank you for choosing [facility] !         

## 2019-03-31 LAB — URINE CULTURE: Culture: >=100000 — AB

## 2019-04-03 ENCOUNTER — Other Ambulatory Visit: Payer: Self-pay | Admitting: Family Medicine

## 2019-04-03 NOTE — Telephone Encounter (Signed)
Needs virtual visit may have 1 refill

## 2019-04-06 ENCOUNTER — Telehealth: Payer: Self-pay | Admitting: Urology

## 2019-04-06 NOTE — Telephone Encounter (Signed)
Culture, Urine (Order [id number])     Contains abnormal data Culture, Urine Order: [id number] Status:  Final result   Visible to patient:  No (inaccessible in [place]) Next appt:  04/18/2019 at 07:30 AM in Cardiology ([facility] Device Remotes) Specimen Information: Urine, Clean Catch      Component 8 d ago  Specimen Description URINE, CLEAN CATCH  Performed at [facility], [address] [place], [place] [address]   Special Requests NONE  Performed at [facility], [address] [place], [place] [address]   Culture >=100,000 COLONIES/mL ENTEROCOCCUS FAECALISAbnormal    Report Status 03/31/2019 FINAL   Organism ID, Bacteria ENTEROCOCCUS FAECALISAbnormal    Resulting Agency [facility]  Susceptibility   Enterococcus faecalis    MIC    AMPICILLIN <=2 SENSITIVE  Sensitive    NITROFURANTOIN <=16 SENSIT... Sensitive    VANCOMYCIN <=0.5 SENSI... Sensitive         Susceptibility Comments  Enterococcus faecalis  >=100,000 COLONIES/mL ENTEROCOCCUS FAECALIS      Specimen Collected: 03/29/19 09:30 Last Resulted: 03/31/19 07:48      Lab Flowsheet    Order Details    View Encounter    Lab and Collection Details    Routing    Result History          MyChart Results Release  MyChart Status: Pending  Results Release  Encounter  View Encounter        Result Information  Flag: AbnormalAbnormal   Status: Final result (Collected: 03/29/2019 09:30) Provider Status: Ordered    Lab Information  [facility]      Order-Level Documents:  There are no order-level documents. View SmartLink Info  Culture, Urine (Order [id number]) on 03/29/19  Result Read / Acknowledged  Acknowledge result No acknowledgement history exists for this order.

## 2019-04-14 ENCOUNTER — Telehealth: Payer: Self-pay | Admitting: Urology

## 2019-04-14 NOTE — Telephone Encounter (Signed)
Pt's daughter called and request the urine culture results for this pt. She states they haven't heard anything and pt is having symptoms that concern her.

## 2019-04-17 ENCOUNTER — Telehealth: Payer: Self-pay | Admitting: Cardiovascular Disease

## 2019-04-17 ENCOUNTER — Other Ambulatory Visit: Payer: Self-pay | Admitting: Urology

## 2019-04-17 ENCOUNTER — Other Ambulatory Visit: Payer: Self-pay

## 2019-04-17 DIAGNOSIS — N390 Urinary tract infection, site not specified: Secondary | ICD-10-CM

## 2019-04-17 MED ORDER — NITROFURANTOIN MONOHYD MACRO 100 MG PO CAPS: 100.0000 mg | ORAL_CAPSULE | Freq: Two times a day (BID) | ORAL | 0 refills | Status: DC

## 2019-04-17 NOTE — Telephone Encounter (Signed)
Spoke with Dr. Diona Fanti who called pt daughter and spoke with her over the phone. Verbal order received to send in Macrobid 100mg  po BID for 7 days. Prescription sent and and daughter notified.

## 2019-04-17 NOTE — Telephone Encounter (Signed)
Pt's daughter called again, she is unhappy that no one has her back regarding his lab results. She would like for someone call her back IMMEDIATELY.

## 2019-04-17 NOTE — Telephone Encounter (Signed)
Left message requesting to speak w/ Dr. Bronson Ing regarding the COVID Vaccine- wants to make sure it's safe since he's a heart pt   [phone]

## 2019-04-17 NOTE — Telephone Encounter (Signed)
I explained to patient that he is able to receive the covid vaccine as he is over 86 yrs old and we recommend that he get his shot.

## 2019-04-18 ENCOUNTER — Ambulatory Visit (INDEPENDENT_AMBULATORY_CARE_PROVIDER_SITE_OTHER): Payer: Medicare Other | Admitting: *Deleted

## 2019-04-18 DIAGNOSIS — I5022 Chronic systolic (congestive) heart failure: Secondary | ICD-10-CM

## 2019-04-18 DIAGNOSIS — Z23 Encounter for immunization: Secondary | ICD-10-CM | POA: Diagnosis not present

## 2019-04-18 LAB — CUP PACEART REMOTE DEVICE CHECK
Battery Remaining Longevity: 20 mo
Battery Voltage: 2.9 V
Brady Statistic AP VP Percent: 0 %
Brady Statistic AP VS Percent: 0 %
Brady Statistic AS VP Percent: 0 %
Brady Statistic AS VS Percent: 0 %
Brady Statistic RA Percent Paced: 0.42 %
Brady Statistic RV Percent Paced: 98.37 %
Date Time Interrogation Session: 20210112003649
Implantable Lead Implant Date: 20170914
Implantable Lead Implant Date: 20170914
Implantable Lead Implant Date: 20170914
Implantable Lead Location: 753858
Implantable Lead Location: 753859
Implantable Lead Location: 753860
Implantable Lead Model: 4598
Implantable Lead Model: 5076
Implantable Lead Model: 5076
Implantable Pulse Generator Implant Date: 20170914
Lead Channel Impedance Value: 323 Ohm
Lead Channel Impedance Value: 399 Ohm
Lead Channel Impedance Value: 456 Ohm
Lead Channel Impedance Value: 475 Ohm
Lead Channel Impedance Value: 532 Ohm
Lead Channel Impedance Value: 532 Ohm
Lead Channel Impedance Value: 551 Ohm
Lead Channel Impedance Value: 570 Ohm
Lead Channel Impedance Value: 627 Ohm
Lead Channel Impedance Value: 722 Ohm
Lead Channel Impedance Value: 817 Ohm
Lead Channel Impedance Value: 836 Ohm
Lead Channel Impedance Value: 836 Ohm
Lead Channel Impedance Value: 950 Ohm
Lead Channel Pacing Threshold Amplitude: 0.5 V
Lead Channel Pacing Threshold Amplitude: 0.625 V
Lead Channel Pacing Threshold Amplitude: 5.5 V
Lead Channel Pacing Threshold Pulse Width: 0.4 ms
Lead Channel Pacing Threshold Pulse Width: 0.4 ms
Lead Channel Pacing Threshold Pulse Width: 1 ms
Lead Channel Sensing Intrinsic Amplitude: 0.875 mV
Lead Channel Sensing Intrinsic Amplitude: 0.875 mV
Lead Channel Sensing Intrinsic Amplitude: 13 mV
Lead Channel Sensing Intrinsic Amplitude: 13 mV
Lead Channel Setting Pacing Amplitude: 2 V
Lead Channel Setting Pacing Amplitude: 2.5 V
Lead Channel Setting Pacing Amplitude: 4 V
Lead Channel Setting Pacing Pulse Width: 0.4 ms
Lead Channel Setting Pacing Pulse Width: 1 ms
Lead Channel Setting Sensing Sensitivity: 2.8 mV

## 2019-04-23 ENCOUNTER — Other Ambulatory Visit: Payer: Self-pay | Admitting: Family Medicine

## 2019-04-24 NOTE — Telephone Encounter (Signed)
Last med check 07/27/18 

## 2019-04-24 NOTE — Telephone Encounter (Signed)
Needs virtual visit may have 1 refill

## 2019-04-25 NOTE — Telephone Encounter (Signed)
Please schedule and then route back.  

## 2019-04-25 NOTE — Telephone Encounter (Signed)
Scheduled 12/2

## 2019-04-28 ENCOUNTER — Other Ambulatory Visit: Payer: Self-pay

## 2019-04-28 ENCOUNTER — Ambulatory Visit (INDEPENDENT_AMBULATORY_CARE_PROVIDER_SITE_OTHER): Payer: Medicare Other | Admitting: Family Medicine

## 2019-04-28 DIAGNOSIS — Z79899 Other long term (current) drug therapy: Secondary | ICD-10-CM

## 2019-04-28 DIAGNOSIS — E1169 Type 2 diabetes mellitus with other specified complication: Secondary | ICD-10-CM | POA: Diagnosis not present

## 2019-04-28 DIAGNOSIS — E7849 Other hyperlipidemia: Secondary | ICD-10-CM

## 2019-04-28 DIAGNOSIS — M1000 Idiopathic gout, unspecified site: Secondary | ICD-10-CM

## 2019-04-28 DIAGNOSIS — I10 Essential (primary) hypertension: Secondary | ICD-10-CM | POA: Diagnosis not present

## 2019-04-28 NOTE — Progress Notes (Signed)
Subjective:    Patient ID: Anthony Lucas, male    DOB: [birth year], 86 y.o.   MRN: [id number]  Diabetes He presents for his follow-up diabetic visit. He has type 2 diabetes mellitus. Pertinent negatives for hypoglycemia include no confusion, dizziness or headaches. Pertinent negatives for diabetes include no chest pain and no fatigue. Current diabetic treatments: metformin. He is compliant with treatment all of the time. He is following a generally healthy diet. Exercise: not able to do regular exercise due to knee but he is active. moves around house and still drives. Home blood sugar record trend: does not check blood sugar.   Just finished a round of antibiotics for uti.  Sees Dr. Raliegh Ip for afib. Daughter states overall he feels pretty good.  Patient states his breathing is doing better he denies chest tightness pressure pain shortness of breath Virtual Visit via Telephone Note  I connected with Anthony Lucas on 04/28/19 at 11:00 AM EST by telephone and verified that I am speaking with the correct person using two identifiers.  Location: Patient: home Provider: office   I discussed the limitations, risks, security and privacy concerns of performing an evaluation and management service by telephone and the availability of in person appointments. I also discussed with the patient that there may be a patient responsible charge related to this service. The patient expressed understanding and agreed to proceed.   History of Present Illness:    Observations/Objective:   Assessment and Plan:   Follow Up Instructions:    I discussed the assessment and treatment plan with the patient. The patient was provided an opportunity to ask questions and all were answered. The patient agreed with the plan and demonstrated an understanding of the instructions.   The patient was advised to call back or seek an in-person evaluation if the symptoms worsen or if the condition fails to improve as  anticipated.  I provided 30 minutes of non-face-to-face time during this encounter.       Review of Systems  Constitutional: Negative for diaphoresis and fatigue.  HENT: Negative for congestion and rhinorrhea.   Respiratory: Negative for cough and shortness of breath.   Cardiovascular: Negative for chest pain and leg swelling.  Gastrointestinal: Negative for abdominal pain and diarrhea.  Skin: Negative for color change and rash.  Neurological: Negative for dizziness and headaches.  Psychiatric/Behavioral: Negative for behavioral problems and confusion.       Objective:   Physical Exam   Today's visit was via telephone Physical exam was not possible for this visit     Assessment & Plan:  1. Essential hypertension Blood pressure decent control continue current measures - Lipid panel - Hepatic function panel - Basic metabolic panel - Hemoglobin A1c - Uric acid - Digoxin level  2. Controlled type 2 diabetes mellitus with other specified complication, without long-term current use of insulin (HCC) Reportedly under good control check A1c await lab tests - Lipid panel - Hepatic function panel - Basic metabolic panel - Hemoglobin A1c - Uric acid - Digoxin level  3. Other hyperlipidemia Hyperlipidemia continue current measures check lab work - Lipid panel - Hepatic function panel - Basic metabolic panel - Hemoglobin A1c - Uric acid - Digoxin level  4. Idiopathic gout, unspecified chronicity, unspecified site No flareups lately check lab work continue meds - Lipid panel - Hepatic function panel - Basic metabolic panel - Hemoglobin A1c - Uric acid - Digoxin level  5. High risk medication use Under the care of  cardiology check digoxin level - Digoxin level Follow-up by summertime in person

## 2019-05-04 ENCOUNTER — Other Ambulatory Visit: Payer: Self-pay | Admitting: Family Medicine

## 2019-05-05 DIAGNOSIS — I10 Essential (primary) hypertension: Secondary | ICD-10-CM | POA: Diagnosis not present

## 2019-05-05 DIAGNOSIS — E1169 Type 2 diabetes mellitus with other specified complication: Secondary | ICD-10-CM | POA: Diagnosis not present

## 2019-05-05 DIAGNOSIS — Z79899 Other long term (current) drug therapy: Secondary | ICD-10-CM | POA: Diagnosis not present

## 2019-05-05 DIAGNOSIS — E7849 Other hyperlipidemia: Secondary | ICD-10-CM | POA: Diagnosis not present

## 2019-05-05 DIAGNOSIS — M1000 Idiopathic gout, unspecified site: Secondary | ICD-10-CM | POA: Diagnosis not present

## 2019-05-06 LAB — DIGOXIN LEVEL: Digoxin, Serum: 0.8 ng/mL (ref 0.5–0.9)

## 2019-05-06 LAB — LIPID PANEL
Chol/HDL Ratio: 3.3 ratio (ref 0.0–5.0)
Cholesterol, Total: 133 mg/dL (ref 100–199)
HDL: 40 mg/dL (ref >39)
LDL Chol Calc (NIH): 67 mg/dL (ref 0–99)
Triglycerides: 153 mg/dL — ABNORMAL HIGH (ref 0–149)
VLDL Cholesterol Cal: 26 mg/dL (ref 5–40)

## 2019-05-06 LAB — HEPATIC FUNCTION PANEL
ALT: 29 IU/L (ref 0–44)
AST: 19 IU/L (ref 0–40)
Albumin: 3.9 g/dL (ref 3.6–4.6)
Alkaline Phosphatase: 85 IU/L (ref 39–117)
Bilirubin Total: 0.4 mg/dL (ref 0.0–1.2)
Bilirubin, Direct: 0.16 mg/dL (ref 0.00–0.40)
Total Protein: 7.4 g/dL (ref 6.0–8.5)

## 2019-05-06 LAB — BASIC METABOLIC PANEL
BUN/Creatinine Ratio: 15 (ref 10–24)
BUN: 21 mg/dL (ref 8–27)
CO2: 26 mmol/L (ref 20–29)
Calcium: 9.2 mg/dL (ref 8.6–10.2)
Chloride: 101 mmol/L (ref 96–106)
Creatinine, Ser: 1.36 mg/dL — ABNORMAL HIGH (ref 0.76–1.27)
GFR calc Af Amer: 54 mL/min/{1.73_m2} — ABNORMAL LOW (ref >59)
GFR calc non Af Amer: 47 mL/min/{1.73_m2} — ABNORMAL LOW (ref >59)
Glucose: 149 mg/dL — ABNORMAL HIGH (ref 65–99)
Potassium: 4.3 mmol/L (ref 3.5–5.2)
Sodium: 142 mmol/L (ref 134–144)

## 2019-05-06 LAB — HEMOGLOBIN A1C
Est. average glucose Bld gHb Est-mCnc: 174 mg/dL
Hgb A1c MFr Bld: 7.7 % — ABNORMAL HIGH (ref 4.8–5.6)

## 2019-05-06 LAB — URIC ACID: Uric Acid: 8.1 mg/dL (ref 3.8–8.4)

## 2019-05-08 ENCOUNTER — Other Ambulatory Visit: Payer: Self-pay | Admitting: *Deleted

## 2019-05-08 MED ORDER — BLOOD GLUCOSE METER KIT: PACK | 5 refills | Status: DC

## 2019-05-09 ENCOUNTER — Other Ambulatory Visit: Payer: Self-pay | Admitting: Cardiovascular Disease

## 2019-05-17 ENCOUNTER — Other Ambulatory Visit: Payer: Self-pay | Admitting: Cardiovascular Disease

## 2019-05-19 DIAGNOSIS — Z23 Encounter for immunization: Secondary | ICD-10-CM | POA: Diagnosis not present

## 2019-05-23 DIAGNOSIS — D225 Melanocytic nevi of trunk: Secondary | ICD-10-CM | POA: Diagnosis not present

## 2019-05-23 DIAGNOSIS — L570 Actinic keratosis: Secondary | ICD-10-CM | POA: Diagnosis not present

## 2019-05-23 DIAGNOSIS — X32XXXD Exposure to sunlight, subsequent encounter: Secondary | ICD-10-CM | POA: Diagnosis not present

## 2019-05-23 DIAGNOSIS — C44319 Basal cell carcinoma of skin of other parts of face: Secondary | ICD-10-CM | POA: Diagnosis not present

## 2019-05-26 ENCOUNTER — Other Ambulatory Visit: Payer: Self-pay | Admitting: Family Medicine

## 2019-05-26 NOTE — Telephone Encounter (Signed)
Refill this a.m. 5 refills

## 2019-05-30 DIAGNOSIS — B351 Tinea unguium: Secondary | ICD-10-CM | POA: Diagnosis not present

## 2019-05-30 DIAGNOSIS — E1142 Type 2 diabetes mellitus with diabetic polyneuropathy: Secondary | ICD-10-CM | POA: Diagnosis not present

## 2019-05-30 DIAGNOSIS — L309 Dermatitis, unspecified: Secondary | ICD-10-CM | POA: Diagnosis not present

## 2019-06-05 ENCOUNTER — Other Ambulatory Visit: Payer: Self-pay | Admitting: Family Medicine

## 2019-06-08 ENCOUNTER — Telehealth: Payer: Self-pay | Admitting: Family Medicine

## 2019-06-08 NOTE — Telephone Encounter (Signed)
Blood Glucose log dropped off and was put in [employer].

## 2019-06-18 NOTE — Telephone Encounter (Signed)
Please let patient know his glucose readings overall look very good.  I would not recommend changing medicines currently.  I would like for the patient to do metabolic 7, [id number] in late April and do a follow-up in person visit by early June thanks-Dr. Nicki Reaper

## 2019-06-19 ENCOUNTER — Other Ambulatory Visit: Payer: Self-pay | Admitting: *Deleted

## 2019-06-19 DIAGNOSIS — I10 Essential (primary) hypertension: Secondary | ICD-10-CM

## 2019-06-19 DIAGNOSIS — E1169 Type 2 diabetes mellitus with other specified complication: Secondary | ICD-10-CM

## 2019-06-19 NOTE — Telephone Encounter (Signed)
Called and discussed with pt. Bw orders put in and mailed to pt to do late April and a note put on there to follow up with dr scott early june

## 2019-06-20 DIAGNOSIS — L309 Dermatitis, unspecified: Secondary | ICD-10-CM | POA: Diagnosis not present

## 2019-06-20 DIAGNOSIS — E1142 Type 2 diabetes mellitus with diabetic polyneuropathy: Secondary | ICD-10-CM | POA: Diagnosis not present

## 2019-06-26 ENCOUNTER — Telehealth: Payer: Self-pay | Admitting: Urology

## 2019-06-26 NOTE — Telephone Encounter (Signed)
Patient feels like infection is back.Pt having symptoms over the last few days.  Requests refill on nitrosurantoin 100mg .  Uses [employer]

## 2019-06-26 NOTE — Telephone Encounter (Signed)
Appt scheduled to see md tomorrow at 1:30. Pt voiced understanding.

## 2019-06-27 ENCOUNTER — Ambulatory Visit (INDEPENDENT_AMBULATORY_CARE_PROVIDER_SITE_OTHER): Payer: Medicare Other | Admitting: Urology

## 2019-06-27 ENCOUNTER — Encounter: Payer: Self-pay | Admitting: Urology

## 2019-06-27 ENCOUNTER — Other Ambulatory Visit (HOSPITAL_COMMUNITY)
Admission: AD | Admit: 2019-06-27 | Discharge: 2019-06-27 | Disposition: A | Discharge status: Home / Self Care | Payer: Medicare Other | Source: Skilled Nursing Facility | Attending: Urology | Admitting: Urology

## 2019-06-27 ENCOUNTER — Other Ambulatory Visit: Payer: Self-pay

## 2019-06-27 VITALS — BP 109/57 | HR 73 | Temp 95.0°F | Ht 73.0 in | Wt 185.0 lb

## 2019-06-27 DIAGNOSIS — N390 Urinary tract infection, site not specified: Secondary | ICD-10-CM | POA: Diagnosis not present

## 2019-06-27 LAB — POCT URINALYSIS DIPSTICK
Bilirubin, UA: NEGATIVE
Glucose, UA: NEGATIVE
Ketones, UA: NEGATIVE
Nitrite, UA: NEGATIVE
Protein, UA: POSITIVE — AB
Spec Grav, UA: >=1.03 — AB (ref 1.010–1.025)
Urobilinogen, UA: 0.2 E.U./dL
pH, UA: 5 (ref 5.0–8.0)

## 2019-06-27 MED ORDER — SULFAMETHOXAZOLE-TRIMETHOPRIM 400-80 MG PO TABS: 1.0000 | ORAL_TABLET | Freq: Two times a day (BID) | ORAL | 1 refills | Status: DC

## 2019-06-27 NOTE — Progress Notes (Signed)
H&P  Chief Complaint: Acute Cystitis  History of Present Illness:   3.23.2021: He returns today worked in for possible UTI. He states that when treated for his last infection, he did not finish the full course of his abx. Currently, he only has cloudy urine and mild dysuria -- no changes in smell, urinary freq, etc. No F/C. No N/V, appetite changes  (below copied from [facility] records):  BPH:  Anthony Lucas is a 86 year-old male established patient who is here for an enlarged prostate follow-up evaluation.  He is currently taking tamsulosin and proscar.   10.7.2020: He returns today for evaluation of hematuria but also notes some issues with intermittency, urgency, weakened stream, as well as some urgency incontinence. He has recently increased his fluid intake quite a bit for his AFib and he thinks this may be worsening his leakage. This leakage occurs daily and has been going on for around 1 yr.   12.1.2020: He continues to have some issues with post-void dribbling and leakage. His urinary sx's are stable since last visit  Acute Cystitis:  10.7.2020: Culture + for enterococcus.   12.1.2020: He returns today having finished 5 day course of amoxicillin and mo long course of supressive abx. He denies any significant urinary sx's remaining from this infection   Hematuria:   10.7.2020: He presents today having had significant cardiovascular issues which were recently diagnosed as AFib. He was on daily aspirin for these issues and began having fairly significant blood per urine -- at both the first part of his stream and the terminal half (but not throughout). He is currently on finasteride but has since discontinued aspirin. He is concerned with not being able to be on blood thinners in order to be put under for AFib treatment. He denies any recent infection or sx's of one. Culture + for enterococcus.   12.1.2020: Here today for follow-up. He has not had any recurrence of this bleeding since last  visit.   Past Medical History:  Diagnosis Date  . Arthritis    "right knee" (12/19/2015)  . CHF (congestive heart failure) ([place])    Abnormal echo January 2017  . History of colon polyps   . History of gout   . Hyperlipidemia   . Hypertension   . Myocardial infarction [facility])    "they said I'd had a heart attack but didn't know it" (12/19/2015)  . Presence of permanent cardiac pacemaker   . Seasonal allergies   . Skin cancer    "arms/hands" (12/19/2015)  . Sleep apnea    supposed to wear CPAP, but doesn't (12/19/2015)  . Type II diabetes mellitus ([place])     Past Surgical History:  Procedure Laterality Date  . CATARACT EXTRACTION W/PHACO Right 09/08/2012   Procedure: CATARACT EXTRACTION PHACO AND INTRAOCULAR LENS PLACEMENT (IOC);  Surgeon: Tonny Branch, MD;  Location: [facility];  Service: Ophthalmology;  Laterality: Right;  CDE: 21.28  . CATARACT EXTRACTION W/PHACO Left 12/08/2012   Procedure: CATARACT EXTRACTION PHACO AND INTRAOCULAR LENS PLACEMENT (IOC);  Surgeon: Tonny Branch, MD;  Location: [facility];  Service: Ophthalmology;  Laterality: Left;  CDE:25.72  . [place]  . COLONOSCOPY     4 procedures  . COLONOSCOPY  2012   Dr. Sharlett Iles: diverticulosis, lipoma in ascending colon.   Consuela Mimes WITH FULGERATION N/A 08/11/2017   Procedure: [place] OF PROSTATIC VARICES;  Surgeon: Cleon Gustin, MD;  Location: [facility];  Service: Urology;  Laterality: N/A;  . EP IMPLANTABLE  DEVICE N/A 12/19/2015   Procedure: BiV Pacemaker Insertion CRT-P;  Surgeon: Evans Lance, MD;  Location: [place] [facility];  Service: Cardiovascular;  Laterality: N/A;  . INSERT / REPLACE / REMOVE PACEMAKER  12/19/2015  . JOINT REPLACEMENT Left    knee  . POLYPECTOMY    . TONSILLECTOMY    . TOTAL KNEE ARTHROPLASTY Left 2000s  . TRANSURETHRAL RESECTION OF PROSTATE    . YAG LASER APPLICATION Right 06/19/1759   Procedure: YAG LASER APPLICATION;  Surgeon: [name], MD;  Location: [facility];  Service: Ophthalmology;  Laterality: Right;  . YAG LASER APPLICATION Left 09/10/3708   Procedure: YAG LASER APPLICATION;  Surgeon: [name], MD;  Location: [facility];  Service: Ophthalmology;  Laterality: Left;    Home Medications:  Allergies as of 06/27/2019      Reactions   Bee Venom Anaphylaxis   Honey bee       Medication List       Accurate as of June 27, 2019  2:09 PM. If you have any questions, ask your nurse or doctor.        allopurinol 100 MG tablet Commonly known as: ZYLOPRIM TAKE 2 TABLETS BY MOUTH DAILY FOR GOUT.   blood glucose meter kit and supplies Dispense based on patient and insurance preference. Use to check blood sugar once daily as directed. For E11.9.   clobetasol cream 0.05 % Commonly known as: TEMOVATE APPLY LIGHTLY TO AFFECTEDUSKIN TWICE DAILY FOR 14EDAYS   digoxin 0.125 MG tablet Commonly known as: LANOXIN Take 1 tablet (0.125 mg total) by mouth daily.   Entresto 24-26 MG Generic drug: sacubitril-valsartan TAKE ONE TABLET BY MOUTH 2 TIMES A DAY.   finasteride 5 MG tablet Commonly known as: PROSCAR TAKE ONE TABLET BY MOUTH ONCE DAILY PROSTATE.   metFORMIN 500 MG tablet Commonly known as: GLUCOPHAGE TAKE ONE TABLET BY MOUTH DAILY FOR DIABETES.   metoprolol succinate 25 MG 24 hr tablet Commonly known as: TOPROL-XL TAKE 1 TABLET BY MOUTH TWICE DAILY.   potassium chloride 10 MEQ tablet Commonly known as: KLOR-CON TAKE 2 TABLETS BY MOUTH DAILY FOR POTASSIUM REPLACEMENT.   simvastatin 40 MG tablet Commonly known as: ZOCOR TAKE 1 TABLET BY MOUTH ONCE DAILY FOR CHOLESTEROL.   tamsulosin 0.4 MG Caps capsule Commonly known as: FLOMAX Take 0.4 mg by mouth. Take one qhs   torsemide 20 MG tablet Commonly known as: DEMADEX Take 1 tablet (20 mg total) by mouth 2 (two) times daily.       Allergies:  Allergies  Allergen Reactions  . Bee Venom Anaphylaxis    Honey bee     Family History  Problem Relation Age of Onset  . Lung  cancer Father   . Colon cancer Neg Hx     Social History:  reports that he has never smoked. He has never used smokeless tobacco. He reports previous alcohol use of about 2.0 standard drinks of alcohol per week. He reports that he does not use drugs.  ROS: A complete review of systems was performed.  All systems are negative except for pertinent findings as noted.  Physical Exam:  Vital signs in last 24 hours: BP (!) 109/57   Pulse 73   Temp (!) 95 F (35 C)   Ht '6\' 1"'$  (1.854 m)   Wt 185 lb (83.9 kg)   BMI 24.41 kg/m  Constitutional:  Alert and oriented, No acute distress Cardiovascular: Regular rate Genitourinary: Not completed. Neurologic: Grossly intact, no focal deficits Psychiatric:  Normal mood and affect    Results for orders placed or performed in visit on 06/27/19 (from the past 24 hour(s))  POCT urinalysis dipstick     Status: Abnormal   Collection Time: 06/27/19  2:03 PM  Result Value Ref Range   Color, UA yellow    Clarity, UA     Glucose, UA Negative Negative   Bilirubin, UA neg    Ketones, UA neg    Spec Grav, UA >=1.030 (A) 1.010 - 1.025   Blood, UA ++    pH, UA 5.0 5.0 - 8.0   Protein, UA Positive (A) Negative   Urobilinogen, UA 0.2 0.2 or 1.0 E.U./dL   Nitrite, UA neg    Leukocytes, UA Moderate (2+) (A) Negative   Appearance cloudy    Odor      I have reviewed prior pt notes  I have reviewed notes from referring/previous physicians  I have reviewed urinalysis results  I have reviewed prior urine culture  Impression/Assessment:  UA today does indicate possible infection -- will culture and treat accordingly.   Plan:  1. Urine culture -- will change abx if needed pending results  2. Keep scheduled follow-up.

## 2019-06-27 NOTE — Progress Notes (Signed)
Urological Symptom Review  Patient is experiencing the following symptoms: Hard to postpone urination Leakage of urine Stream starts and stops Weak stream   Review of Systems  Gastrointestinal (upper)  : Negative for upper GI symptoms  Gastrointestinal (lower) : Negative for lower GI symptoms  Constitutional : Negative for symptoms  Skin: Negative for skin symptoms  Eyes: Negative for eye symptoms  Ear/Nose/Throat : Negative for Ear/Nose/Throat symptoms  Hematologic/Lymphatic: Negative for Hematologic/Lymphatic symptoms  Cardiovascular : Negative for cardiovascular symptoms  Respiratory : Negative for respiratory symptoms  Endocrine: Negative for endocrine symptoms  Musculoskeletal: Negative for musculoskeletal symptoms  Neurological: Negative for neurological symptoms  Psychologic: Negative for psychiatric symptoms

## 2019-06-28 LAB — URINE CULTURE: Culture: <10000 — AB

## 2019-07-03 ENCOUNTER — Telehealth: Payer: Self-pay

## 2019-07-03 NOTE — Telephone Encounter (Signed)
-----   Message from Franchot Gallo, MD sent at 06/30/2019  6:29 PM EDT -----   Notify patient that urine culture was essentially negative but it is okay to continue the sulfa ----- Message ----- From: Dorisann Frames, RN Sent: 06/30/2019   9:32 AM EDT To: Franchot Gallo, MD  Urine culture

## 2019-07-04 DIAGNOSIS — Z85828 Personal history of other malignant neoplasm of skin: Secondary | ICD-10-CM | POA: Diagnosis not present

## 2019-07-04 DIAGNOSIS — L570 Actinic keratosis: Secondary | ICD-10-CM | POA: Diagnosis not present

## 2019-07-04 DIAGNOSIS — Z08 Encounter for follow-up examination after completed treatment for malignant neoplasm: Secondary | ICD-10-CM | POA: Diagnosis not present

## 2019-07-04 DIAGNOSIS — X32XXXD Exposure to sunlight, subsequent encounter: Secondary | ICD-10-CM | POA: Diagnosis not present

## 2019-07-07 ENCOUNTER — Other Ambulatory Visit: Payer: Self-pay | Admitting: Family Medicine

## 2019-07-10 NOTE — Telephone Encounter (Signed)
Unable to reach pt  Letter sent

## 2019-07-18 ENCOUNTER — Ambulatory Visit (INDEPENDENT_AMBULATORY_CARE_PROVIDER_SITE_OTHER): Payer: Medicare Other | Admitting: *Deleted

## 2019-07-18 DIAGNOSIS — I5022 Chronic systolic (congestive) heart failure: Secondary | ICD-10-CM | POA: Diagnosis not present

## 2019-07-18 LAB — CUP PACEART REMOTE DEVICE CHECK
Battery Remaining Longevity: 19 mo
Battery Voltage: 2.89 V
Brady Statistic AP VP Percent: 0 %
Brady Statistic AP VS Percent: 0 %
Brady Statistic AS VP Percent: 0 %
Brady Statistic AS VS Percent: 0 %
Brady Statistic RA Percent Paced: 0.37 %
Brady Statistic RV Percent Paced: 99.57 %
Date Time Interrogation Session: 20210413013752
Implantable Lead Implant Date: 20170914
Implantable Lead Implant Date: 20170914
Implantable Lead Implant Date: 20170914
Implantable Lead Location: 753858
Implantable Lead Location: 753859
Implantable Lead Location: 753860
Implantable Lead Model: 4598
Implantable Lead Model: 5076
Implantable Lead Model: 5076
Implantable Pulse Generator Implant Date: 20170914
Lead Channel Impedance Value: 323 Ohm
Lead Channel Impedance Value: 437 Ohm
Lead Channel Impedance Value: 456 Ohm
Lead Channel Impedance Value: 475 Ohm
Lead Channel Impedance Value: 494 Ohm
Lead Channel Impedance Value: 532 Ohm
Lead Channel Impedance Value: 551 Ohm
Lead Channel Impedance Value: 551 Ohm
Lead Channel Impedance Value: 570 Ohm
Lead Channel Impedance Value: 760 Ohm
Lead Channel Impedance Value: 836 Ohm
Lead Channel Impedance Value: 855 Ohm
Lead Channel Impedance Value: 893 Ohm
Lead Channel Impedance Value: 931 Ohm
Lead Channel Pacing Threshold Amplitude: 0.5 V
Lead Channel Pacing Threshold Amplitude: 0.625 V
Lead Channel Pacing Threshold Amplitude: 4 V
Lead Channel Pacing Threshold Pulse Width: 0.4 ms
Lead Channel Pacing Threshold Pulse Width: 0.4 ms
Lead Channel Pacing Threshold Pulse Width: 1 ms
Lead Channel Sensing Intrinsic Amplitude: 1.375 mV
Lead Channel Sensing Intrinsic Amplitude: 1.375 mV
Lead Channel Sensing Intrinsic Amplitude: 12.75 mV
Lead Channel Sensing Intrinsic Amplitude: 12.75 mV
Lead Channel Setting Pacing Amplitude: 2 V
Lead Channel Setting Pacing Amplitude: 2.5 V
Lead Channel Setting Pacing Amplitude: 4 V
Lead Channel Setting Pacing Pulse Width: 0.4 ms
Lead Channel Setting Pacing Pulse Width: 1 ms
Lead Channel Setting Sensing Sensitivity: 2.8 mV

## 2019-07-19 NOTE — Progress Notes (Signed)
PPM Remote  

## 2019-07-24 ENCOUNTER — Other Ambulatory Visit: Payer: Self-pay | Admitting: Family Medicine

## 2019-07-27 IMAGING — DX CHEST - 2 VIEW
2 series · 2 of 2 positions shown · non-contrast
Comparison: 12/26/2015

CLINICAL DATA: Rule out CHF, SOB

EXAM:
CHEST - 2 VIEW

[chest pa]
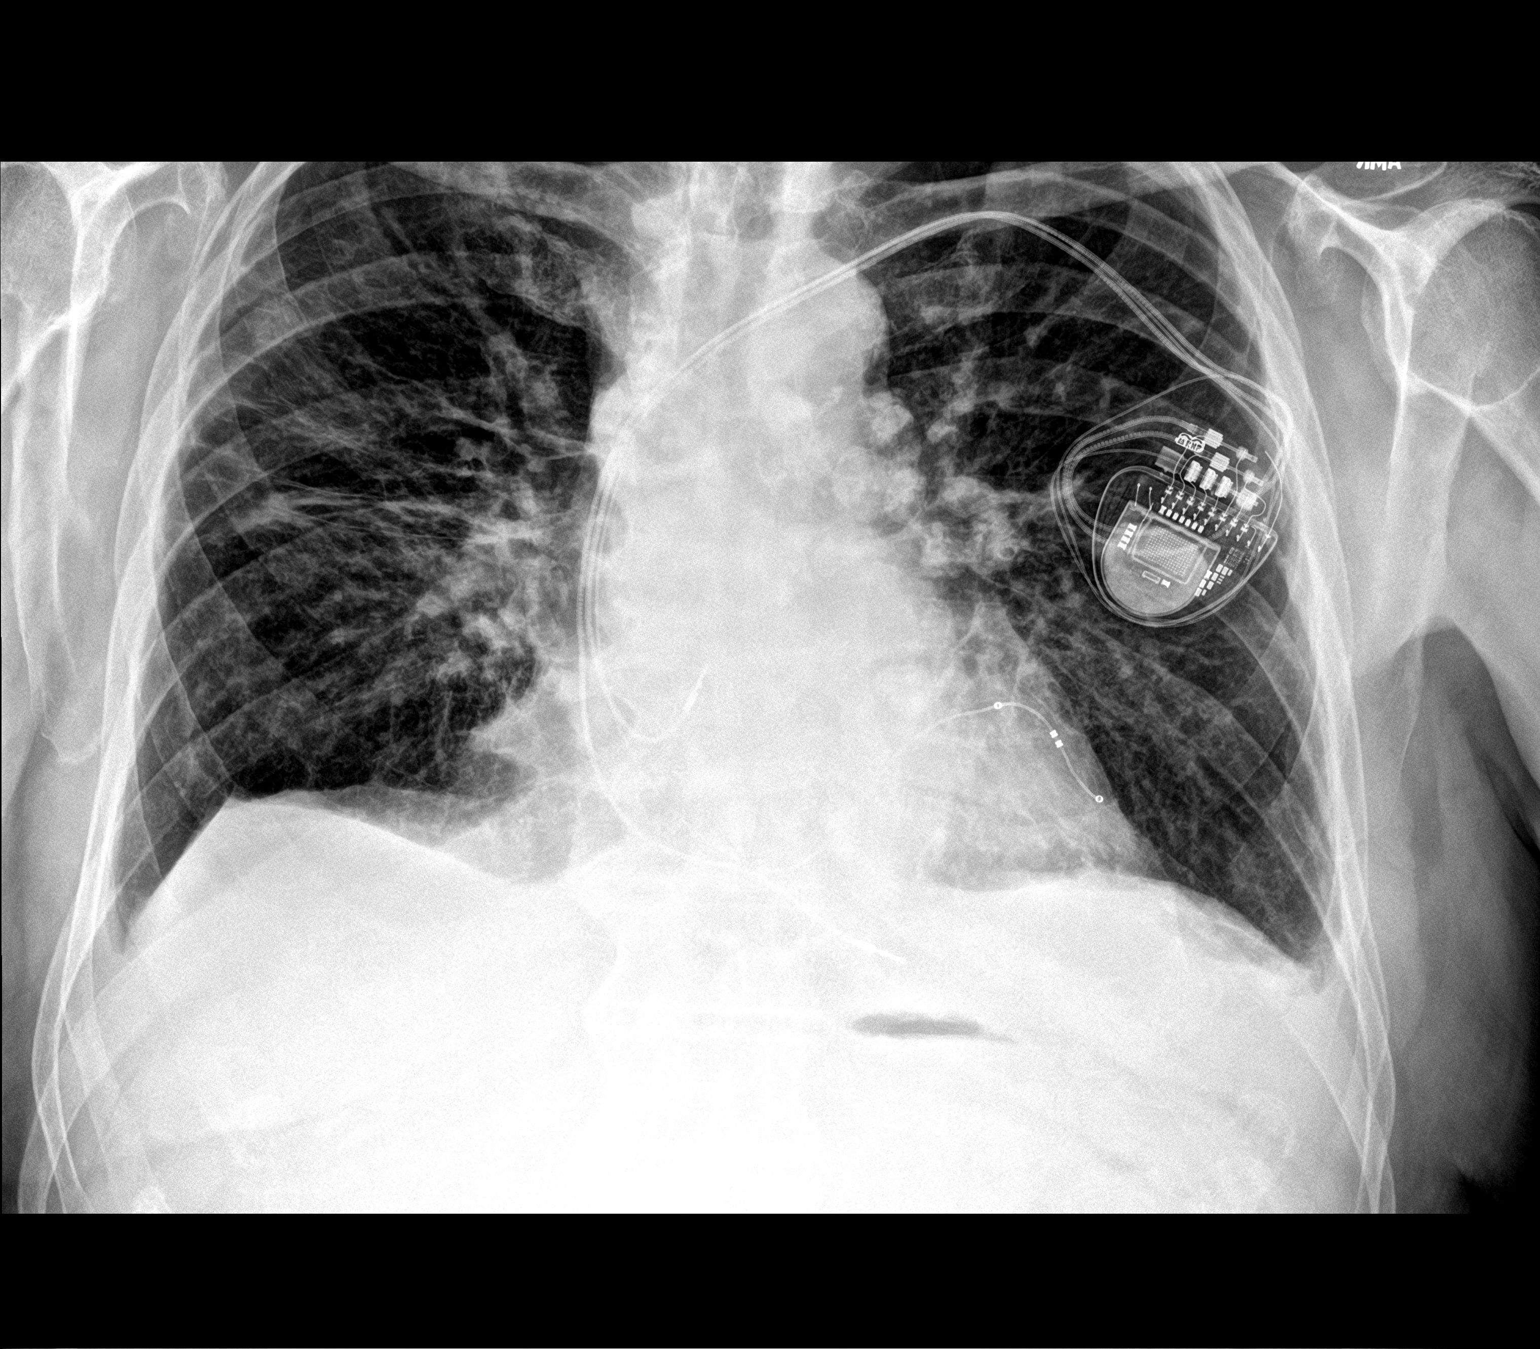

[chest lat]
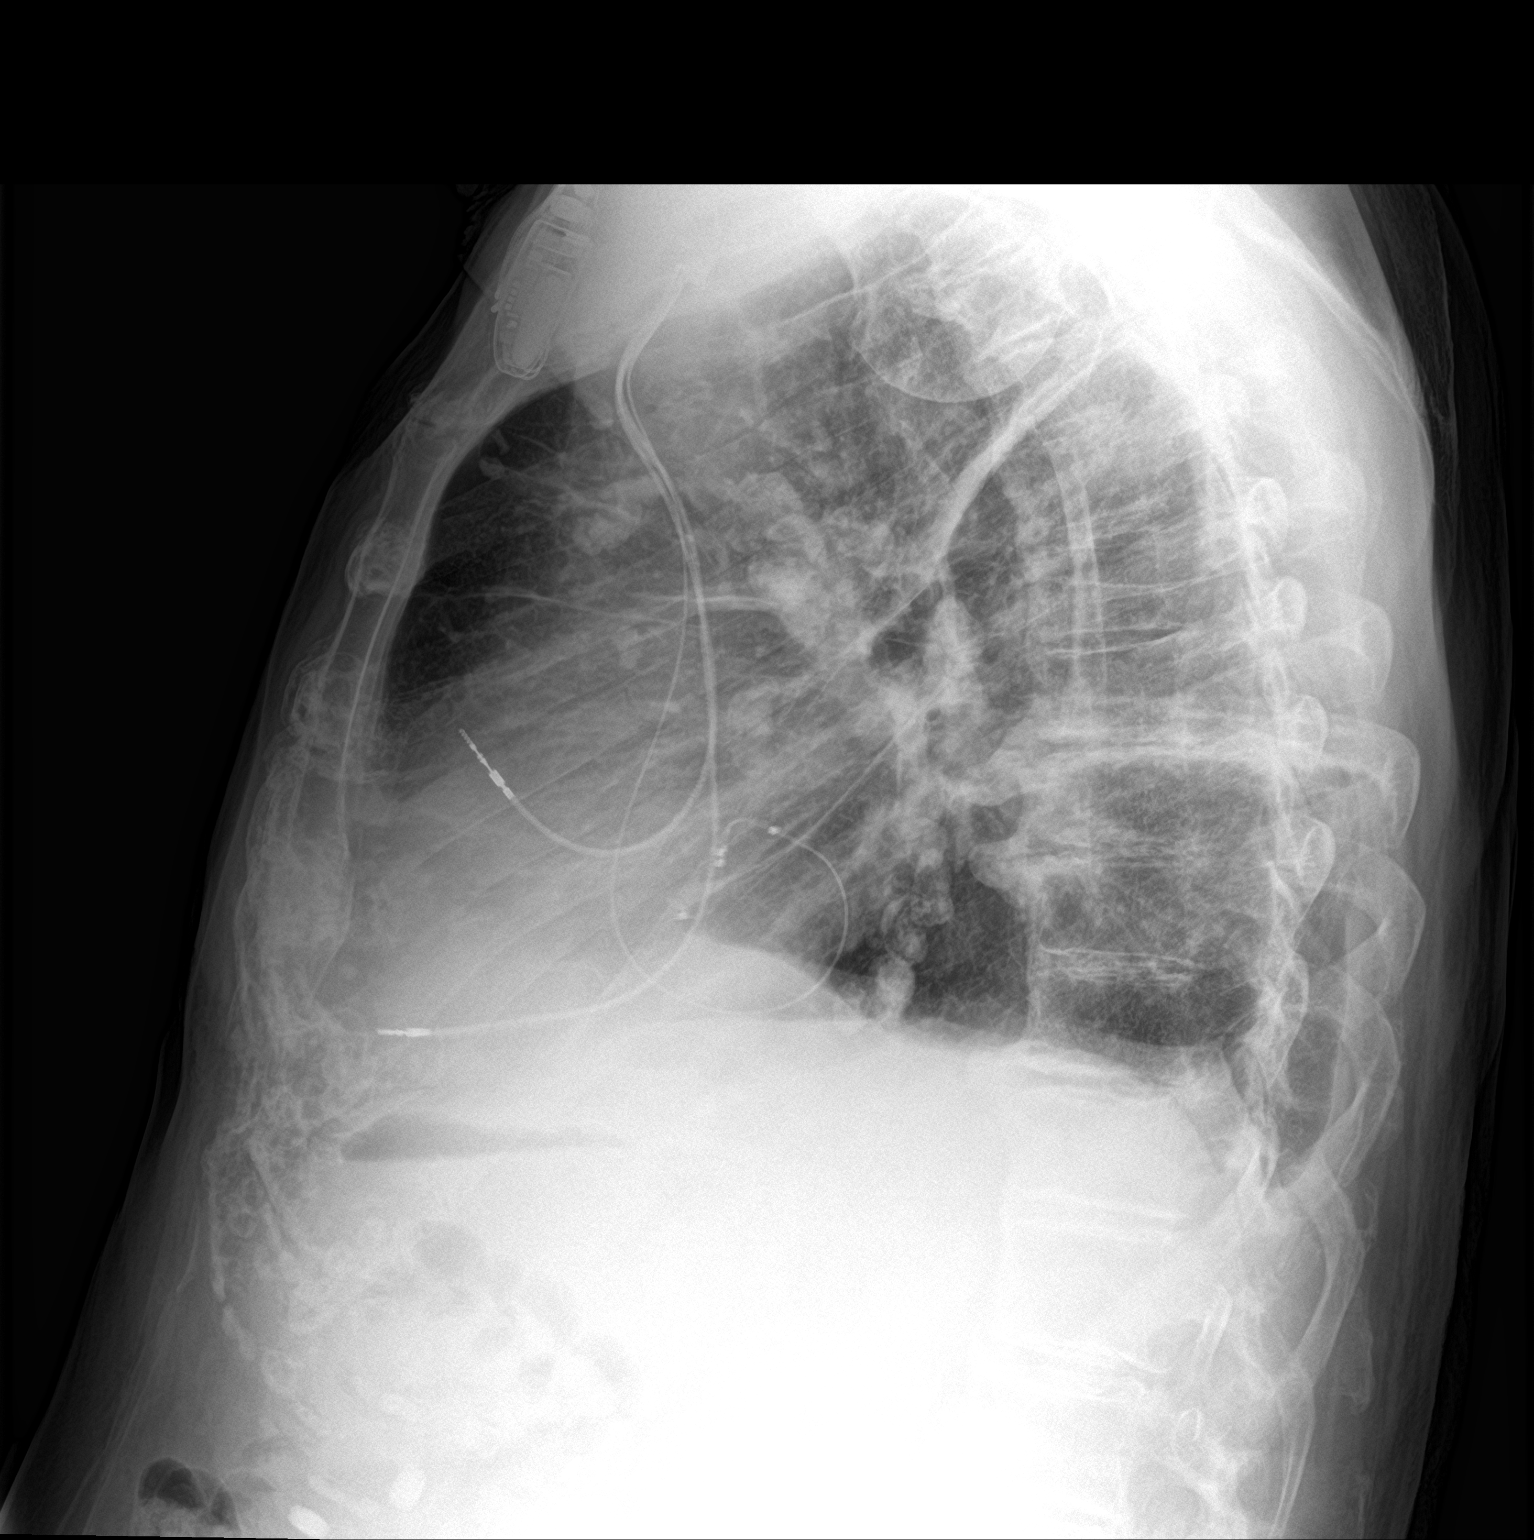

[2 of 2 positions shown; findings below may reference images not displayed]

FINDINGS: The heart size and mediastinal contours are within normal limits.
Calcified bilateral hilar and mediastinal lymph nodes. Bilateral
bandlike scarring or atelectasis similar to prior examination, most
conspicuous in the right midlung. Disc degenerative disease of the
thoracic spine.
IMPRESSION: No acute abnormality of the lungs. No acute appearing airspace
opacity.

## 2019-08-08 ENCOUNTER — Other Ambulatory Visit: Payer: Self-pay | Admitting: Family Medicine

## 2019-08-09 NOTE — Telephone Encounter (Signed)
May have 3 refills recommend follow-up this summer

## 2019-08-14 ENCOUNTER — Other Ambulatory Visit: Payer: Self-pay | Admitting: Cardiovascular Disease

## 2019-09-05 ENCOUNTER — Other Ambulatory Visit: Payer: Self-pay | Admitting: Internal Medicine

## 2019-09-07 ENCOUNTER — Telehealth: Payer: Self-pay | Admitting: Urology

## 2019-09-07 NOTE — Telephone Encounter (Signed)
Pts daughter Malachy Mood called and requested a nurse return the call regarding a UTI.

## 2019-09-08 ENCOUNTER — Other Ambulatory Visit: Payer: Self-pay

## 2019-09-08 DIAGNOSIS — N390 Urinary tract infection, site not specified: Secondary | ICD-10-CM

## 2019-09-08 MED ORDER — SULFAMETHOXAZOLE-TRIMETHOPRIM 400-80 MG PO TABS: 1.0000 | ORAL_TABLET | Freq: Two times a day (BID) | ORAL | 1 refills | Status: DC

## 2019-09-11 ENCOUNTER — Other Ambulatory Visit: Payer: Self-pay | Admitting: Family Medicine

## 2019-09-11 NOTE — Telephone Encounter (Signed)
Regarding possible UTI--need urine specimen to check

## 2019-09-11 NOTE — Telephone Encounter (Signed)
May have 90-day follow-up by July

## 2019-09-12 NOTE — Telephone Encounter (Signed)
Please schedule follow up in July and then route back to nurses to send in refill

## 2019-09-12 NOTE — Telephone Encounter (Signed)
Scheduled 7/12

## 2019-09-13 NOTE — Telephone Encounter (Signed)
Called and spoke with drt. She said she had called back earlier and another Dr. In the practice had rxed Bactrim. I don't see a note on this. I told her any other problems to call us back.

## 2019-09-26 ENCOUNTER — Other Ambulatory Visit: Payer: Self-pay | Admitting: Internal Medicine

## 2019-09-26 DIAGNOSIS — I5022 Chronic systolic (congestive) heart failure: Secondary | ICD-10-CM

## 2019-10-03 ENCOUNTER — Other Ambulatory Visit: Payer: Self-pay | Admitting: Family Medicine

## 2019-10-05 ENCOUNTER — Telehealth: Payer: Self-pay | Admitting: Family Medicine

## 2019-10-05 MED ORDER — FINASTERIDE 5 MG PO TABS: ORAL_TABLET | ORAL | 0 refills | Status: DC

## 2019-10-05 NOTE — Telephone Encounter (Signed)
Pt requesting refill on finasteride (PROSCAR) 5 MG tablet   [place], [place] - [place]

## 2019-10-16 ENCOUNTER — Ambulatory Visit (INDEPENDENT_AMBULATORY_CARE_PROVIDER_SITE_OTHER): Payer: Medicare Other | Admitting: Family Medicine

## 2019-10-16 ENCOUNTER — Other Ambulatory Visit: Payer: Self-pay

## 2019-10-16 ENCOUNTER — Encounter: Payer: Self-pay | Admitting: Family Medicine

## 2019-10-16 VITALS — BP 114/70 | Temp 97.2°F | Ht 73.0 in | Wt 190.2 lb

## 2019-10-16 DIAGNOSIS — R5383 Other fatigue: Secondary | ICD-10-CM

## 2019-10-16 DIAGNOSIS — I10 Essential (primary) hypertension: Secondary | ICD-10-CM | POA: Diagnosis not present

## 2019-10-16 DIAGNOSIS — M1000 Idiopathic gout, unspecified site: Secondary | ICD-10-CM

## 2019-10-16 DIAGNOSIS — E7849 Other hyperlipidemia: Secondary | ICD-10-CM

## 2019-10-16 DIAGNOSIS — I5020 Unspecified systolic (congestive) heart failure: Secondary | ICD-10-CM

## 2019-10-16 DIAGNOSIS — E1169 Type 2 diabetes mellitus with other specified complication: Secondary | ICD-10-CM | POA: Diagnosis not present

## 2019-10-16 MED ORDER — POTASSIUM CHLORIDE ER 10 MEQ PO TBCR: EXTENDED_RELEASE_TABLET | ORAL | 1 refills | Status: DC

## 2019-10-16 MED ORDER — FINASTERIDE 5 MG PO TABS: ORAL_TABLET | ORAL | 1 refills | Status: DC

## 2019-10-16 MED ORDER — ALLOPURINOL 100 MG PO TABS: ORAL_TABLET | ORAL | 5 refills | Status: DC

## 2019-10-16 MED ORDER — SIMVASTATIN 40 MG PO TABS: ORAL_TABLET | ORAL | 1 refills | Status: DC

## 2019-10-16 MED ORDER — METFORMIN HCL 500 MG PO TABS: ORAL_TABLET | ORAL | 1 refills | Status: DC

## 2019-10-16 NOTE — Progress Notes (Signed)
   Subjective:    Patient ID: Anthony Lucas, male    DOB: 02-Oct-1930, 86 y.o.   MRN: [id number]  Hypertension This is a chronic problem. The current episode started more than 1 year ago. Risk factors for coronary artery disease include dyslipidemia and male gender. Treatments tried: toprol. There are no compliance problems.   Patient with blood pressure related issues Also has BPH and has frequent urination during the day and at nighttime Sees urology On Flomax daily Occasionally gets a little dizzy when he stands up No passing out no falls Patient has diabetes with some mild renal insufficiency does need follow-up He thinks his sugars have been okay Cholesterol takes medication does need up-to-date testing Has history of gout as well this is been under good control. Patient lives by himself but he does have family members checking on him frequently to help fix food for him as well He denies being depressed  Fall Risk  03/01/2019 02/18/2018 01/25/2018 09/22/2016 12/03/2015  Falls in the past year? 0 0 No No No  Comment [facility] Telephone Survey: data to providers prior to load [employer] Telephone Survey: data to providers prior to load - - [facility] Telephone Survey: data to providers prior to load    Review of Systems    Please see above Objective:   Physical Exam Lungs clear respiratory rate normal heart rate is controlled extremities no edema skin warm dry  diabetic foot exam no ulcers are seen at this point Mild neuropathy      Assessment & Plan:  1. Essential hypertension Blood pressure under good control - Hepatic function panel - Basic metabolic panel - CBC with Differential/Platelet  2. Systolic congestive heart failure, unspecified HF chronicity (HCC) Heart failure under good control currently continue current measures-see cardiology at least every 6 months - Hepatic function panel - CBC with Differential/Platelet  3. Controlled type 2 diabetes mellitus with other specified  complication, without long-term current use of insulin ([place]) Type 2 diabetes under good control continue current measures check lab work await results - Hepatic function panel - Basic metabolic panel - CBC with Differential/Platelet - Hemoglobin A1c  4. Other hyperlipidemia Hyperlipidemia continue current measures continue medication check lab work - Lipid panel - Hepatic function panel  5. Idiopathic gout, unspecified chronicity, unspecified site Continue allopurinol monitor kidney function   6. Other fatigue Mild fatigue tiredness check lab work - CBC with Differential/Platelet

## 2019-10-17 ENCOUNTER — Ambulatory Visit (INDEPENDENT_AMBULATORY_CARE_PROVIDER_SITE_OTHER): Payer: Medicare Other | Admitting: *Deleted

## 2019-10-17 DIAGNOSIS — I5022 Chronic systolic (congestive) heart failure: Secondary | ICD-10-CM

## 2019-10-17 LAB — CUP PACEART REMOTE DEVICE CHECK
Battery Remaining Longevity: 18 mo
Battery Voltage: 2.87 V
Brady Statistic AP VP Percent: 0 %
Brady Statistic AP VS Percent: 0 %
Brady Statistic AS VP Percent: 0 %
Brady Statistic AS VS Percent: 0 %
Brady Statistic RA Percent Paced: 0.43 %
Brady Statistic RV Percent Paced: 99.56 %
Date Time Interrogation Session: 20210713013843
Implantable Lead Implant Date: 20170914
Implantable Lead Implant Date: 20170914
Implantable Lead Implant Date: 20170914
Implantable Lead Location: 753858
Implantable Lead Location: 753859
Implantable Lead Location: 753860
Implantable Lead Model: 4598
Implantable Lead Model: 5076
Implantable Lead Model: 5076
Implantable Pulse Generator Implant Date: 20170914
Lead Channel Impedance Value: 304 Ohm
Lead Channel Impedance Value: 418 Ohm
Lead Channel Impedance Value: 418 Ohm
Lead Channel Impedance Value: 437 Ohm
Lead Channel Impedance Value: 494 Ohm
Lead Channel Impedance Value: 513 Ohm
Lead Channel Impedance Value: 532 Ohm
Lead Channel Impedance Value: 532 Ohm
Lead Channel Impedance Value: 570 Ohm
Lead Channel Impedance Value: 722 Ohm
Lead Channel Impedance Value: 798 Ohm
Lead Channel Impedance Value: 836 Ohm
Lead Channel Impedance Value: 855 Ohm
Lead Channel Impedance Value: 912 Ohm
Lead Channel Pacing Threshold Amplitude: 0.5 V
Lead Channel Pacing Threshold Amplitude: 0.625 V
Lead Channel Pacing Threshold Amplitude: 5.5 V
Lead Channel Pacing Threshold Pulse Width: 0.4 ms
Lead Channel Pacing Threshold Pulse Width: 0.4 ms
Lead Channel Pacing Threshold Pulse Width: 1 ms
Lead Channel Sensing Intrinsic Amplitude: 0.875 mV
Lead Channel Sensing Intrinsic Amplitude: 0.875 mV
Lead Channel Sensing Intrinsic Amplitude: 9.75 mV
Lead Channel Sensing Intrinsic Amplitude: 9.75 mV
Lead Channel Setting Pacing Amplitude: 2 V
Lead Channel Setting Pacing Amplitude: 2.5 V
Lead Channel Setting Pacing Amplitude: 4 V
Lead Channel Setting Pacing Pulse Width: 0.4 ms
Lead Channel Setting Pacing Pulse Width: 1 ms
Lead Channel Setting Sensing Sensitivity: 2.8 mV

## 2019-10-17 LAB — HEPATIC FUNCTION PANEL
ALT: 11 IU/L (ref 0–44)
AST: 13 IU/L (ref 0–40)
Albumin: 4.4 g/dL (ref 3.6–4.6)
Alkaline Phosphatase: 58 IU/L (ref 48–121)
Bilirubin Total: 0.4 mg/dL (ref 0.0–1.2)
Bilirubin, Direct: 0.11 mg/dL (ref 0.00–0.40)
Total Protein: 7.3 g/dL (ref 6.0–8.5)

## 2019-10-17 LAB — HEMOGLOBIN A1C
Est. average glucose Bld gHb Est-mCnc: 171 mg/dL
Hgb A1c MFr Bld: 7.6 % — ABNORMAL HIGH (ref 4.8–5.6)

## 2019-10-17 LAB — BASIC METABOLIC PANEL
BUN/Creatinine Ratio: 15 (ref 10–24)
BUN: 20 mg/dL (ref 8–27)
CO2: 27 mmol/L (ref 20–29)
Calcium: 9.2 mg/dL (ref 8.6–10.2)
Chloride: 99 mmol/L (ref 96–106)
Creatinine, Ser: 1.35 mg/dL — ABNORMAL HIGH (ref 0.76–1.27)
GFR calc Af Amer: 55 mL/min/{1.73_m2} — ABNORMAL LOW (ref >59)
GFR calc non Af Amer: 47 mL/min/{1.73_m2} — ABNORMAL LOW (ref >59)
Glucose: 110 mg/dL — ABNORMAL HIGH (ref 65–99)
Potassium: 4.9 mmol/L (ref 3.5–5.2)
Sodium: 143 mmol/L (ref 134–144)

## 2019-10-17 LAB — CBC WITH DIFFERENTIAL/PLATELET
Basophils Absolute: 0.1 10*3/uL (ref 0.0–0.2)
Basos: 1 %
EOS (ABSOLUTE): 0.3 10*3/uL (ref 0.0–0.4)
Eos: 3 %
Hematocrit: 41.3 % (ref 37.5–51.0)
Hemoglobin: 13.2 g/dL (ref 13.0–17.7)
Immature Grans (Abs): 0 10*3/uL (ref 0.0–0.1)
Immature Granulocytes: 1 %
Lymphocytes Absolute: 0.9 10*3/uL (ref 0.7–3.1)
Lymphs: 11 %
MCH: 27.6 pg (ref 26.6–33.0)
MCHC: 32 g/dL (ref 31.5–35.7)
MCV: 86 fL (ref 79–97)
Monocytes Absolute: 0.7 10*3/uL (ref 0.1–0.9)
Monocytes: 9 %
Neutrophils Absolute: 5.8 10*3/uL (ref 1.4–7.0)
Neutrophils: 75 %
Platelets: 165 10*3/uL (ref 150–450)
RBC: 4.79 x10E6/uL (ref 4.14–5.80)
RDW: 13.2 % (ref 11.6–15.4)
WBC: 7.7 10*3/uL (ref 3.4–10.8)

## 2019-10-17 LAB — LIPID PANEL
Chol/HDL Ratio: 3.7 ratio (ref 0.0–5.0)
Cholesterol, Total: 157 mg/dL (ref 100–199)
HDL: 42 mg/dL (ref >39)
LDL Chol Calc (NIH): 76 mg/dL (ref 0–99)
Triglycerides: 240 mg/dL — ABNORMAL HIGH (ref 0–149)
VLDL Cholesterol Cal: 39 mg/dL (ref 5–40)

## 2019-10-18 ENCOUNTER — Encounter: Payer: Self-pay | Admitting: Family Medicine

## 2019-10-18 NOTE — Progress Notes (Signed)
Remote pacemaker transmission.   

## 2019-11-21 DIAGNOSIS — L570 Actinic keratosis: Secondary | ICD-10-CM | POA: Diagnosis not present

## 2019-11-21 DIAGNOSIS — X32XXXD Exposure to sunlight, subsequent encounter: Secondary | ICD-10-CM | POA: Diagnosis not present

## 2019-11-22 ENCOUNTER — Encounter: Payer: Self-pay | Admitting: Internal Medicine

## 2019-11-22 ENCOUNTER — Ambulatory Visit (INDEPENDENT_AMBULATORY_CARE_PROVIDER_SITE_OTHER): Payer: Medicare Other | Admitting: Internal Medicine

## 2019-11-22 ENCOUNTER — Other Ambulatory Visit: Payer: Self-pay

## 2019-11-22 VITALS — BP 116/64 | HR 78 | Ht 73.0 in | Wt 188.0 lb

## 2019-11-22 DIAGNOSIS — I5022 Chronic systolic (congestive) heart failure: Secondary | ICD-10-CM | POA: Diagnosis not present

## 2019-11-22 DIAGNOSIS — Z950 Presence of cardiac pacemaker: Secondary | ICD-10-CM | POA: Diagnosis not present

## 2019-11-22 DIAGNOSIS — I4811 Longstanding persistent atrial fibrillation: Secondary | ICD-10-CM

## 2019-11-22 LAB — CUP PACEART INCLINIC DEVICE CHECK
Battery Remaining Longevity: 16 mo
Battery Voltage: 2.86 V
Brady Statistic AP VP Percent: 0 %
Brady Statistic AP VS Percent: 0 %
Brady Statistic AS VP Percent: 0 %
Brady Statistic AS VS Percent: 0 %
Brady Statistic RA Percent Paced: 0.37 %
Brady Statistic RV Percent Paced: 99.16 %
Date Time Interrogation Session: 20210818111324
Implantable Lead Implant Date: 20170914
Implantable Lead Implant Date: 20170914
Implantable Lead Implant Date: 20170914
Implantable Lead Location: 753858
Implantable Lead Location: 753859
Implantable Lead Location: 753860
Implantable Lead Model: 4598
Implantable Lead Model: 5076
Implantable Lead Model: 5076
Implantable Pulse Generator Implant Date: 20170914
Lead Channel Impedance Value: 1007 Ohm
Lead Channel Impedance Value: 323 Ohm
Lead Channel Impedance Value: 456 Ohm
Lead Channel Impedance Value: 475 Ohm
Lead Channel Impedance Value: 475 Ohm
Lead Channel Impedance Value: 532 Ohm
Lead Channel Impedance Value: 551 Ohm
Lead Channel Impedance Value: 570 Ohm
Lead Channel Impedance Value: 608 Ohm
Lead Channel Impedance Value: 646 Ohm
Lead Channel Impedance Value: 817 Ohm
Lead Channel Impedance Value: 893 Ohm
Lead Channel Impedance Value: 912 Ohm
Lead Channel Impedance Value: 950 Ohm
Lead Channel Pacing Threshold Amplitude: 0.5 V
Lead Channel Pacing Threshold Amplitude: 4.5 V
Lead Channel Pacing Threshold Pulse Width: 0.4 ms
Lead Channel Pacing Threshold Pulse Width: 1 ms
Lead Channel Sensing Intrinsic Amplitude: 2.375 mV
Lead Channel Setting Pacing Amplitude: 2.5 V
Lead Channel Setting Pacing Amplitude: 5 V
Lead Channel Setting Pacing Pulse Width: 0.4 ms
Lead Channel Setting Pacing Pulse Width: 1 ms
Lead Channel Setting Sensing Sensitivity: 2.8 mV

## 2019-11-22 NOTE — Progress Notes (Signed)
HPI Anthony Lucas returns today for followup. He has a h/o chronic systolic heartf ailure, s/p biv PPM insertion. His LV lead is chronically elevated in pacing threshold. His other vectors have diaghragmatic stimulation.  He has developed atrial fib with a controlled Vr. The patient has had worsening CHF symptoms. He has had problems with bleeding on systemic anti-coagulation. He had his diuretic switched from lasix to torsemide. He has done surprisingly well since his last visit. He admits to sodium indiscretion. Allergies  Allergen Reactions  . Bee Venom Anaphylaxis    Honey bee      Current Outpatient Medications  Medication Sig Dispense Refill  . allopurinol (ZYLOPRIM) 100 MG tablet 2 qd for gout prevention 60 tablet 5  . blood glucose meter kit and supplies Dispense based on patient and insurance preference. Use to check blood sugar once daily as directed. For E11.9. 1 each 5  . digoxin (LANOXIN) 0.125 MG tablet TAKE ONE TABLET BY MOUTH EVERY DAY 90 tablet 1  . ENTRESTO 24-26 MG TAKE ONE TABLET BY MOUTH 2 TIMES A DAY. 60 tablet 2  . finasteride (PROSCAR) 5 MG tablet TAKE ONE TABLET BY MOUTH ONCE DAILY PROSTATE. 90 tablet 1  . metFORMIN (GLUCOPHAGE) 500 MG tablet 1 qd 90 tablet 1  . metoprolol succinate (TOPROL-XL) 25 MG 24 hr tablet TAKE 1 TABLET BY MOUTH TWICE DAILY. 60 tablet 6  . potassium chloride (KLOR-CON) 10 MEQ tablet 2 qd 180 tablet 1  . simvastatin (ZOCOR) 40 MG tablet 1 qd 90 tablet 1  . tamsulosin (FLOMAX) 0.4 MG CAPS capsule Take 0.4 mg by mouth. Take one qhs    . torsemide (DEMADEX) 20 MG tablet TAKE 1 TABLET BY MOUTH TWICE A DAY. 60 tablet 0  . clobetasol cream (TEMOVATE) 0.05 % APPLY LIGHTLY TO AFFECTEDUSKIN TWICE DAILY FOR 14EDAYS     No current facility-administered medications for this visit.     Past Medical History:  Diagnosis Date  . Arthritis    "right knee" (12/19/2015)  . CHF (congestive heart failure) ([place])    Abnormal echo January 2017  . History  of colon polyps   . History of gout   . Hyperlipidemia   . Hypertension   . Myocardial infarction [facility])    "they said I'd had a heart attack but didn't know it" (12/19/2015)  . Presence of permanent cardiac pacemaker   . Seasonal allergies   . Skin cancer    "arms/hands" (12/19/2015)  . Sleep apnea    supposed to wear CPAP, but doesn't (12/19/2015)  . Type II diabetes mellitus (HCC)     ROS:   All systems reviewed and negative except as noted in the HPI.   Past Surgical History:  Procedure Laterality Date  . CATARACT EXTRACTION W/PHACO Right 09/08/2012   Procedure: CATARACT EXTRACTION PHACO AND INTRAOCULAR LENS PLACEMENT (IOC);  Surgeon: Tonny Branch, MD;  Location: [facility];  Service: Ophthalmology;  Laterality: Right;  CDE: 21.28  . CATARACT EXTRACTION W/PHACO Left 12/08/2012   Procedure: CATARACT EXTRACTION PHACO AND INTRAOCULAR LENS PLACEMENT (IOC);  Surgeon: Tonny Branch, MD;  Location: [facility] ORS;  Service: Ophthalmology;  Laterality: Left;  CDE:25.72  . [place]  . COLONOSCOPY     4 procedures  . COLONOSCOPY  2012   Dr. Sharlett Iles: diverticulosis, lipoma in ascending colon.   Consuela Mimes WITH FULGERATION N/A 08/11/2017   Procedure: [place] OF PROSTATIC VARICES;  Surgeon: Cleon Gustin, MD;  Location: [facility]  ORS;  Service: Urology;  Laterality: N/A;  . EP IMPLANTABLE DEVICE N/A 12/19/2015   Procedure: BiV Pacemaker Insertion CRT-P;  Surgeon: Evans Lance, MD;  Location: [place] [facility] LAB;  Service: Cardiovascular;  Laterality: N/A;  . INSERT / REPLACE / REMOVE PACEMAKER  12/19/2015  . JOINT REPLACEMENT Left    knee  . POLYPECTOMY    . TONSILLECTOMY    . TOTAL KNEE ARTHROPLASTY Left 2000s  . TRANSURETHRAL RESECTION OF PROSTATE    . YAG LASER APPLICATION Right 4/00/8676   Procedure: YAG LASER APPLICATION;  Surgeon: [name], MD;  Location: [facility] ORS;  Service: Ophthalmology;  Laterality: Right;  . YAG LASER APPLICATION Left 1/95/0932    Procedure: YAG LASER APPLICATION;  Surgeon: [name], MD;  Location: [facility];  Service: Ophthalmology;  Laterality: Left;     Family History  Problem Relation Age of Onset  . Lung cancer Father   . Colon cancer Neg Hx      Social History   Socioeconomic History  . Marital status: Widowed    Spouse name: Not on file  . Number of children: Not on file  . Years of education: Not on file  . Highest education level: Not on file  Occupational History  . Not on file  Tobacco Use  . Smoking status: Never Smoker  . Smokeless tobacco: Never Used  Vaping Use  . Vaping Use: Never used  Substance and Sexual Activity  . Alcohol use: Not Currently    Alcohol/week: 2.0 standard drinks    Types: 2 Glasses of wine per week  . Drug use: No  . Sexual activity: Not Currently    Birth control/protection: None  Other Topics Concern  . Not on file  Social History Narrative  . Not on file   Social Determinants of Health   Financial Resource Strain:   . Difficulty of Paying Living Expenses:   Food Insecurity:   . Worried About Charity fundraiser in the Last Year:   . Arboriculturist in the Last Year:   Transportation Needs:   . Film/video editor (Medical):   Marland Kitchen Lack of Transportation (Non-Medical):   Physical Activity:   . Days of Exercise per Week:   . Minutes of Exercise per Session:   Stress:   . Feeling of Stress :   Social Connections:   . Frequency of Communication with Friends and Family:   . Frequency of Social Gatherings with Friends and Family:   . Attends Religious Services:   . Active Member of Clubs or Organizations:   . Attends Archivist Meetings:   Marland Kitchen Marital Status:   Intimate Partner Violence:   . Fear of Current or Ex-Partner:   . Emotionally Abused:   Marland Kitchen Physically Abused:   . Sexually Abused:      BP 116/64   Pulse 78   Ht 6' 1" (1.854 m)   Wt 188 lb (85.3 kg)   SpO2 97%   BMI 24.80 kg/m   Physical Exam:  Well appearing elderly  man, NAD HEENT: Unremarkable Neck:  No JVD, no thyromegally Lymphatics:  No adenopathy Back:  No CVA tenderness Lungs:  Clear with no wheezes HEART:  Regular rate rhythm, no murmurs, no rubs, no clicks Abd:  soft, positive bowel sounds, no organomegally, no rebound, no guarding Ext:  2 plus pulses, no edema, no cyanosis, no clubbing Skin:  No rashes no nodules Neuro:  CN II through XII intact, motor grossly  intact  EKG - atrial fib with ventricular pacing  DEVICE  Normal device function.  See PaceArt for details. Elevated LV threshold  Assess/Plan: 1. Chronic systolic heart failure - his symptoms remain class 2. He will continue his current meds and maintain a low sodium diet. 2. BIV PPM - he has an elevated LV threshold. When he reaches ERI, might consider left bundle area pacing. 3. Atrial fib - his VR is controlled. Because of GI bleeding, he will not be on an [place].    Salome Spotted.

## 2019-11-22 NOTE — Patient Instructions (Signed)
Medication Instructions:  Your physician recommends that you continue on your current medications as directed. Please refer to the Current Medication list given to you today.  *If you need a refill on your cardiac medications before your next appointment, please call your pharmacy*   Lab Work: NONE   If you have labs (blood work) drawn today and your tests are completely normal, you will receive your results only by: . MyChart Message (if you have MyChart) OR . A paper copy in the mail If you have any lab test that is abnormal or we need to change your treatment, we will call you to review the results.   Testing/Procedures: NONE    Follow-Up: At [facility], you and your health needs are our priority.  As part of our continuing mission to provide you with exceptional heart care, we have created designated Provider Care Teams.  These Care Teams include your primary Cardiologist (physician) and Advanced Practice Providers (APPs -  Physician Assistants and Nurse Practitioners) who all work together to provide you with the care you need, when you need it.  We recommend signing up for the patient portal called "MyChart".  Sign up information is provided on this After Visit Summary.  MyChart is used to connect with patients for Virtual Visits (Telemedicine).  Patients are able to view lab/test results, encounter notes, upcoming appointments, etc.  Non-urgent messages can be sent to your provider as well.   To learn more about what you can do with MyChart, go to [email or web address].    Your next appointment:   1 year(s)  The format for your next appointment:   In Person  Provider:   Gregg Taylor, MD   Other Instructions Thank you for choosing [facility]!    

## 2019-12-05 DIAGNOSIS — B351 Tinea unguium: Secondary | ICD-10-CM | POA: Diagnosis not present

## 2019-12-05 DIAGNOSIS — E1142 Type 2 diabetes mellitus with diabetic polyneuropathy: Secondary | ICD-10-CM | POA: Diagnosis not present

## 2019-12-19 ENCOUNTER — Other Ambulatory Visit: Payer: Self-pay | Admitting: Cardiology

## 2019-12-27 ENCOUNTER — Ambulatory Visit (INDEPENDENT_AMBULATORY_CARE_PROVIDER_SITE_OTHER): Payer: Medicare Other | Admitting: Urology

## 2019-12-27 ENCOUNTER — Other Ambulatory Visit: Payer: Self-pay

## 2019-12-27 ENCOUNTER — Encounter: Payer: Self-pay | Admitting: Urology

## 2019-12-27 VITALS — BP 106/61 | HR 87 | Temp 97.8°F | Ht 73.0 in | Wt 188.0 lb

## 2019-12-27 DIAGNOSIS — N3021 Other chronic cystitis with hematuria: Secondary | ICD-10-CM | POA: Diagnosis not present

## 2019-12-27 DIAGNOSIS — R339 Retention of urine, unspecified: Secondary | ICD-10-CM | POA: Diagnosis not present

## 2019-12-27 LAB — URINALYSIS, ROUTINE W REFLEX MICROSCOPIC
Bilirubin, UA: NEGATIVE
Glucose, UA: NEGATIVE
Ketones, UA: NEGATIVE
Nitrite, UA: NEGATIVE
Protein,UA: NEGATIVE
Specific Gravity, UA: 1.015 (ref 1.005–1.030)
Urobilinogen, Ur: 0.2 mg/dL (ref 0.2–1.0)
pH, UA: 5.5 (ref 5.0–7.5)

## 2019-12-27 LAB — BLADDER SCAN AMB NON-IMAGING: Scan Result: >528

## 2019-12-27 LAB — MICROSCOPIC EXAMINATION
Renal Epithel, UA: NONE SEEN /hpf
WBC, UA: >30 /hpf — AB (ref 0–5)

## 2019-12-27 MED ORDER — NITROFURANTOIN MONOHYD MACRO 100 MG PO CAPS: 100.0000 mg | ORAL_CAPSULE | Freq: Two times a day (BID) | ORAL | 0 refills | Status: DC

## 2019-12-27 MED ORDER — TAMSULOSIN HCL 0.4 MG PO CAPS
0.4000 mg | ORAL_CAPSULE | Freq: Two times a day (BID) | ORAL | 11 refills | Status: DC
Start: 2019-12-27 — End: 2020-02-09

## 2019-12-27 NOTE — Progress Notes (Signed)
Urological Symptom Review  Patient is experiencing the following symptoms: Frequent urination Hard to postpone urination Get up at night to urinate Leakage of urine Stream starts and stops Erection problems (male only)   Review of Systems  Gastrointestinal (upper)  : Negative for upper GI symptoms  Gastrointestinal (lower) : Negative for lower GI symptoms  Constitutional : Fatigue  Skin: Negative for skin symptoms  Eyes: Negative for eye symptoms  Ear/Nose/Throat : Negative for Ear/Nose/Throat symptoms  Hematologic/Lymphatic: Negative for Hematologic/Lymphatic symptoms  Cardiovascular : Negative for cardiovascular symptoms  Respiratory : Shortness of breath  Endocrine: Negative for endocrine symptoms  Musculoskeletal: Negative for musculoskeletal symptoms  Neurological: Negative for neurological symptoms  Psychologic: Negative for psychiatric symptoms

## 2019-12-27 NOTE — Patient Instructions (Signed)

## 2019-12-27 NOTE — Progress Notes (Signed)
12/27/2019 2:37 PM   Anthony Lucas [birth year] [id number]  Referring provider: Kathyrn Drown, MD [address],  [place] [address]  Recurrent UTI  HPI: Anthony Lucas is a 86yo here for followup for BPH and recurrent UTI. He was last seen by me in 2020. He has had 4-5 UTIs since last visit. Last culture grew enterococcus.  He remains on flomax 0.$RemoveBef'4mg'iWJHJRYPTP$  daily and finasteride for BPH. PVR 528cc today. UA is concerning for infection. Urine is cloudy today. He is having mild dysuria. Urine stream fair. Nocturia 4-5x. No gross hematuria   His records from [facility] are as follows: I have an enlarged prostate (follow-up).  HPI: Anthony Lucas is a 86 year-old male established patient who is here for an enlarged prostate follow-up evaluation.  He is currently taking tamsulosin and proscar.   He does not have an abnormal sensation when needing to urinate. He is not having problems getting his urine stream started. He does have a good size and strength to his urinary stream. He is not having problems with emptying his bladder well. He does not dribble at the end of urination.   09/29/2017: Pt has longstanding LUTS managed with finasteride and flomax   03/23/2018: He had 1 UTI on 11/13 which was treated with ceftin. He has dribbling with each urination. He is taking flomax and finasteride with good results. Stream has improved.   08/31/2018: He was doing well until 3 weeks ago when he developed worsening urgency, frequency.   09/21/2018: LUTS significantly improved after completing the antibiotics.     CC: I have acute cystitis.  HPI: He does have a burning sensation when he urinates. He does have pelvic or rectal pain related to voiding. He does not have recurrent infections.   He is not having problems with urinary control or incontinence. He is not incontinent immediately following the strong urge to urinate. He is urinating more frequently now than usual.   08/31/2018: 3 weeks ago he  developed dysuria, urinary frequency, and nocturia. Urge incontinence has worsened.   09/21/2018: LUTS resolved after completing ceftin       PMH: Past Medical History:  Diagnosis Date  . Arthritis    "right knee" (12/19/2015)  . CHF (congestive heart failure) ([place])    Abnormal echo January 2017  . History of colon polyps   . History of gout   . Hyperlipidemia   . Hypertension   . Myocardial infarction [facility])    "they said I'd had a heart attack but didn't know it" (12/19/2015)  . Presence of permanent cardiac pacemaker   . Seasonal allergies   . Skin cancer    "arms/hands" (12/19/2015)  . Sleep apnea    supposed to wear CPAP, but doesn't (12/19/2015)  . Type II diabetes mellitus ([place])     Surgical History: Past Surgical History:  Procedure Laterality Date  . CATARACT EXTRACTION W/PHACO Right 09/08/2012   Procedure: CATARACT EXTRACTION PHACO AND INTRAOCULAR LENS PLACEMENT (IOC);  Surgeon: Tonny Branch, MD;  Location: [facility];  Service: Ophthalmology;  Laterality: Right;  CDE: 21.28  . CATARACT EXTRACTION W/PHACO Left 12/08/2012   Procedure: CATARACT EXTRACTION PHACO AND INTRAOCULAR LENS PLACEMENT (IOC);  Surgeon: Tonny Branch, MD;  Location: [facility];  Service: Ophthalmology;  Laterality: Left;  CDE:25.72  . [place]  . COLONOSCOPY     4 procedures  . COLONOSCOPY  2012   Dr. Sharlett Iles: diverticulosis, lipoma in ascending colon.   . CYSTOSCOPY WITH  FULGERATION N/A 08/11/2017   Procedure: CYSTOSCOPY WITH FULGERATION OF PROSTATIC VARICES;  Surgeon: Cleon Gustin, MD;  Location: [facility] OR[facility];  Service: Urology;  Laterality: N/A;  . EP IMPLANTABLE DEVICE N/A 12/19/2015   Procedure: BiV Pacemaker Insertion CRT-P;  Surgeon: Evans Lance, MD;  Location: [place] CV LAB;  Service: Cardiovascular;  Laterality: N/A;  . INSERT / REPLACE / REMOVE PACEMAKER  12/19/2015  . JOINT REPLACEMENT Left    knee  . POLYPECTOMY    . TONSILLECTOMY    . TOTAL KNEE ARTHROPLASTY Left 2000s    . TRANSURETHRAL RESECTION OF PROSTATE    . YAG LASER APPLICATION Right 08/21/6158   Procedure: YAG LASER APPLICATION;  Surgeon: [name], MD;  Location: [facility];  Service: Ophthalmology;  Laterality: Right;  . YAG LASER APPLICATION Left 7/37/1062   Procedure: YAG LASER APPLICATION;  Surgeon: [name], MD;  Location: [facility];  Service: Ophthalmology;  Laterality: Left;    Home Medications:  Allergies as of 12/27/2019      Reactions   Bee Venom Anaphylaxis   Honey bee       Medication List       Accurate as of December 27, 2019  2:37 PM. If you have any questions, ask your nurse or doctor.        allopurinol 100 MG tablet Commonly known as: ZYLOPRIM 2 qd for gout prevention   blood glucose meter kit and supplies Dispense based on patient and insurance preference. Use to check blood sugar once daily as directed. For E11.9.   clobetasol cream 0.05 % Commonly known as: TEMOVATE APPLY LIGHTLY TO AFFECTEDUSKIN TWICE DAILY FOR 14EDAYS   digoxin 0.125 MG tablet Commonly known as: LANOXIN TAKE ONE TABLET BY MOUTH EVERY DAY   Entresto 24-26 MG Generic drug: sacubitril-valsartan TAKE ONE TABLET BY MOUTH 2 TIMES A DAY.   finasteride 5 MG tablet Commonly known as: PROSCAR TAKE ONE TABLET BY MOUTH ONCE DAILY PROSTATE.   metFORMIN 500 MG tablet Commonly known as: GLUCOPHAGE 1 qd   metoprolol succinate 25 MG 24 hr tablet Commonly known as: TOPROL-XL TAKE 1 TABLET BY MOUTH TWICE DAILY.   potassium chloride 10 MEQ tablet Commonly known as: KLOR-CON 2 qd   simvastatin 40 MG tablet Commonly known as: ZOCOR 1 qd   tamsulosin 0.4 MG Caps capsule Commonly known as: FLOMAX Take 0.4 mg by mouth. Take one qhs   torsemide 20 MG tablet Commonly known as: DEMADEX TAKE 1 TABLET BY MOUTH TWICE A DAY.       Allergies:  Allergies  Allergen Reactions  . Bee Venom Anaphylaxis    Honey bee     Family History: Family History  Problem Relation Age of Onset  . Lung  cancer Father   . Colon cancer Neg Hx     Social History:  reports that he has never smoked. He has never used smokeless tobacco. He reports previous alcohol use of about 2.0 standard drinks of alcohol per week. He reports that he does not use drugs.  ROS: All other review of systems were reviewed and are negative except what is noted above in HPI  Physical Exam: BP 106/61   Pulse 87   Temp 97.8 F (36.6 C)   Ht $R'6\' 1"'az$  (1.854 m)   Wt 188 lb (85.3 kg)   BMI 24.80 kg/m   Constitutional:  Alert and oriented, No acute distress. HEENT: [place] AT, moist mucus membranes.  Trachea midline, no masses. Cardiovascular: No clubbing, cyanosis, or  edema. Respiratory: Normal respiratory effort, no increased work of breathing. GI: Abdomen is soft, nontender, nondistended, no abdominal masses GU: No CVA tenderness.  Lymph: No cervical or inguinal lymphadenopathy. Skin: No rashes, bruises or suspicious lesions. Neurologic: Grossly intact, no focal deficits, moving all 4 extremities. Psychiatric: Normal mood and affect.  Laboratory Data: Lab Results  Component Value Date   WBC 7.7 10/16/2019   HGB 13.2 10/16/2019   HCT 41.3 10/16/2019   MCV 86 10/16/2019   PLT 165 10/16/2019    Lab Results  Component Value Date   CREATININE 1.35 (H) 10/16/2019    No results found for: PSA  No results found for: TESTOSTERONE  Lab Results  Component Value Date   HGBA1C 7.6 (H) 10/16/2019    Urinalysis    Component Value Date/Time   BILIRUBINUR neg 06/27/2019 1403   PROTEINUR Positive (A) 06/27/2019 1403   UROBILINOGEN 0.2 06/27/2019 1403   NITRITE neg 06/27/2019 1403   LEUKOCYTESUR Moderate (2+) (A) 06/27/2019 1403    Lab Results  Component Value Date   LABMICR 49.2 01/26/2018    Pertinent Imaging:  No results found for this or any previous visit.  No results found for this or any previous visit.  No results found for this or any previous visit.  No results found for this or any  previous visit.  No results found for this or any previous visit.  No results found for this or any previous visit.  No results found for this or any previous visit.  No results found for this or any previous visit.   Assessment & Plan:    1. Chronic cystitis with hematuria -Urine culture -Macrobid BID for 7 days  2. Incomplete emptying of bladder -Increase flomax to 0.$RemoveBef'4mg'himmIOrFIw$  BID -Continue finasteride   No follow-ups on file.  Nicolette Bang, MD  [facility] [place]

## 2019-12-31 LAB — URINE CULTURE

## 2020-01-01 ENCOUNTER — Telehealth: Payer: Self-pay

## 2020-01-01 NOTE — Telephone Encounter (Signed)
Pt daughter called and made aware 

## 2020-01-01 NOTE — Telephone Encounter (Signed)
-----   Message from Cleon Gustin, MD sent at 01/01/2020  8:52 AM EDT ----- Culture positive. Have him continue antibitoics ----- Message ----- From: Iris Pert, LPN Sent: 9/93/5701   8:10 AM EDT To: Cleon Gustin, MD  Please review

## 2020-01-15 ENCOUNTER — Other Ambulatory Visit: Payer: Self-pay | Admitting: Cardiology

## 2020-01-15 ENCOUNTER — Other Ambulatory Visit: Payer: Self-pay | Admitting: Student

## 2020-01-15 DIAGNOSIS — I5022 Chronic systolic (congestive) heart failure: Secondary | ICD-10-CM

## 2020-01-16 ENCOUNTER — Ambulatory Visit (INDEPENDENT_AMBULATORY_CARE_PROVIDER_SITE_OTHER): Payer: Medicare Other

## 2020-01-16 ENCOUNTER — Telehealth: Payer: Self-pay | Admitting: Student

## 2020-01-16 DIAGNOSIS — I5022 Chronic systolic (congestive) heart failure: Secondary | ICD-10-CM

## 2020-01-16 LAB — CUP PACEART REMOTE DEVICE CHECK
Battery Remaining Longevity: 11 mo
Battery Voltage: 2.83 V
Brady Statistic AP VP Percent: 0 %
Brady Statistic AP VS Percent: 0 %
Brady Statistic AS VP Percent: 99.29 %
Brady Statistic AS VS Percent: 0.71 %
Brady Statistic RA Percent Paced: 0.14 %
Brady Statistic RV Percent Paced: 99.29 %
Date Time Interrogation Session: 20211012013906
Implantable Lead Implant Date: 20170914
Implantable Lead Implant Date: 20170914
Implantable Lead Implant Date: 20170914
Implantable Lead Location: 753858
Implantable Lead Location: 753859
Implantable Lead Location: 753860
Implantable Lead Model: 4598
Implantable Lead Model: 5076
Implantable Lead Model: 5076
Implantable Pulse Generator Implant Date: 20170914
Lead Channel Impedance Value: 1083 Ohm
Lead Channel Impedance Value: 323 Ohm
Lead Channel Impedance Value: 437 Ohm
Lead Channel Impedance Value: 456 Ohm
Lead Channel Impedance Value: 456 Ohm
Lead Channel Impedance Value: 532 Ohm
Lead Channel Impedance Value: 532 Ohm
Lead Channel Impedance Value: 589 Ohm
Lead Channel Impedance Value: 627 Ohm
Lead Channel Impedance Value: 684 Ohm
Lead Channel Impedance Value: 779 Ohm
Lead Channel Impedance Value: 893 Ohm
Lead Channel Impedance Value: 931 Ohm
Lead Channel Impedance Value: 950 Ohm
Lead Channel Pacing Threshold Amplitude: 0.5 V
Lead Channel Pacing Threshold Amplitude: 0.625 V
Lead Channel Pacing Threshold Amplitude: 5.5 V
Lead Channel Pacing Threshold Pulse Width: 0.4 ms
Lead Channel Pacing Threshold Pulse Width: 0.4 ms
Lead Channel Pacing Threshold Pulse Width: 1 ms
Lead Channel Sensing Intrinsic Amplitude: 0.75 mV
Lead Channel Sensing Intrinsic Amplitude: 10 mV
Lead Channel Sensing Intrinsic Amplitude: 10 mV
Lead Channel Sensing Intrinsic Amplitude: 2.375 mV
Lead Channel Setting Pacing Amplitude: 2.5 V
Lead Channel Setting Pacing Amplitude: 5 V
Lead Channel Setting Pacing Pulse Width: 0.4 ms
Lead Channel Setting Pacing Pulse Width: 1 ms
Lead Channel Setting Sensing Sensitivity: 2.8 mV

## 2020-01-16 NOTE — Telephone Encounter (Signed)
Scheduled remote reviewed with ? Intermittent loss of LV capture with threshold 5.5V @ 1.0 ms with a programmed output of 5.0V @ 1.0 ms.  Discussed with daughter who is listed as pt contact.   Pt is available to come to device clinic 10/14 for evaluation of his LV threshold.   Legrand Como [address]" [place], PA-C  01/16/2020 1:23 PM

## 2020-01-17 NOTE — Progress Notes (Signed)
Remote pacemaker transmission.   

## 2020-01-18 ENCOUNTER — Other Ambulatory Visit: Payer: Self-pay

## 2020-01-18 ENCOUNTER — Ambulatory Visit (INDEPENDENT_AMBULATORY_CARE_PROVIDER_SITE_OTHER): Payer: Medicare Other | Admitting: Emergency Medicine

## 2020-01-18 DIAGNOSIS — I5022 Chronic systolic (congestive) heart failure: Secondary | ICD-10-CM

## 2020-01-18 LAB — CUP PACEART INCLINIC DEVICE CHECK
Battery Remaining Longevity: 11 mo
Battery Voltage: 2.83 V
Brady Statistic AP VP Percent: 0 %
Brady Statistic AP VS Percent: 0 %
Brady Statistic AS VP Percent: 99.3 %
Brady Statistic AS VS Percent: 0.7 %
Brady Statistic RA Percent Paced: 0.14 %
Brady Statistic RV Percent Paced: 99.3 %
Date Time Interrogation Session: 20211014095238
Implantable Lead Implant Date: 20170914
Implantable Lead Implant Date: 20170914
Implantable Lead Implant Date: 20170914
Implantable Lead Location: 753858
Implantable Lead Location: 753859
Implantable Lead Location: 753860
Implantable Lead Model: 4598
Implantable Lead Model: 5076
Implantable Lead Model: 5076
Implantable Pulse Generator Implant Date: 20170914
Lead Channel Impedance Value: 1045 Ohm
Lead Channel Impedance Value: 323 Ohm
Lead Channel Impedance Value: 437 Ohm
Lead Channel Impedance Value: 475 Ohm
Lead Channel Impedance Value: 494 Ohm
Lead Channel Impedance Value: 532 Ohm
Lead Channel Impedance Value: 551 Ohm
Lead Channel Impedance Value: 570 Ohm
Lead Channel Impedance Value: 665 Ohm
Lead Channel Impedance Value: 665 Ohm
Lead Channel Impedance Value: 722 Ohm
Lead Channel Impedance Value: 855 Ohm
Lead Channel Impedance Value: 912 Ohm
Lead Channel Impedance Value: 912 Ohm
Lead Channel Pacing Threshold Amplitude: 0.5 V
Lead Channel Pacing Threshold Amplitude: 4.5 V
Lead Channel Pacing Threshold Pulse Width: 0.4 ms
Lead Channel Pacing Threshold Pulse Width: 1.5 ms
Lead Channel Sensing Intrinsic Amplitude: 10 mV
Lead Channel Setting Pacing Amplitude: 2.5 V
Lead Channel Setting Pacing Amplitude: 5 V
Lead Channel Setting Pacing Pulse Width: 0.4 ms
Lead Channel Setting Pacing Pulse Width: 1.5 ms
Lead Channel Setting Sensing Sensitivity: 2.8 mV

## 2020-01-18 NOTE — Progress Notes (Signed)
CRT-P device check in clinic. Sensing, impedance consistent with previous measurements. In-clinic check of LC threshold, LV threshold now 4.5 V @ 1.5 ms. Pulse width extended to 4.5 ms.  Confirmed with [employer] rep. and A. Tillery, PA-C. Patient bi-ventricularly pacing 99.1% of the time. Device programmed with appropriate safety margins. Device heart failure diagnostics are within normal limits and stable over time. Estimated longevity 11 months. Patient enrolled in remote follow1/11/21. Will forward to Dr. Lovena Le to make aware.

## 2020-01-22 ENCOUNTER — Other Ambulatory Visit: Payer: Self-pay | Admitting: Student

## 2020-01-22 ENCOUNTER — Other Ambulatory Visit: Payer: Self-pay | Admitting: Family Medicine

## 2020-01-23 ENCOUNTER — Telehealth: Payer: Self-pay | Admitting: Student

## 2020-01-23 NOTE — Telephone Encounter (Signed)
Medication refilled via refill pool today.

## 2020-01-23 NOTE — Telephone Encounter (Signed)
New message      *STAT* If patient is at the pharmacy, call can be transferred to refill team.   1. Which medications need to be refilled? (please list name of each medication and dose if known) metoprolol succinate (TOPROL-XL) 25 MG 24 hr tablet  2. Which pharmacy/location (including street and city if local pharmacy) is medication to be sent to? [facility]  3. Do they need a 30 day or 90 day supply? [place]

## 2020-01-25 NOTE — Progress Notes (Signed)
Cardiology Office Note   Date:  01/26/2020   ID:  Anthony Lucas, DOB 1930-08-06, MRN [id number]  PCP:  Kathyrn Drown, MD Cardiologist:  Kate Sable, MD (Inactive) 12/23/23020 Change to Rozann Lesches, MD Electrphysiologist: Cristopher Peru, MD 11/22/2019 Rosaria Ferries, PA-C   No chief complaint on file.   History of Present Illness: Anthony Lucas is a 86 y.o. male with a history of Bivent CHF, s/p MDT CRT-P, Afib w/ controlled VR, no anticoag 2nd bleeding issues, OSA not on CPAP, HTN, HLD, DM2  Anthony Lucas presents for cardiology follow up.   He lives alone, his daughter lives with him in the winter.   Baseline wt at home is 182-183 lbs. Wt has been stable at home.   He ambulates around the house, does light work in the house and yard. No chest pain or SOB w/ exertion. Admits his exertion level is not high, he sits and watches Westerns on TV a great deal.   Does not wake w/ LE edema, no orthopnea or PND.   No palpitations, was not aware of the atrial fib.    Past Medical History:  Diagnosis Date  . Arthritis    "right knee" (12/19/2015)  . CHF (congestive heart failure) ([place])    Abnormal echo January 2017  . History of colon polyps   . History of gout   . Hyperlipidemia   . Hypertension   . Myocardial infarction [facility])    "they said I'd had a heart attack but didn't know it" (12/19/2015)  . Presence of permanent cardiac pacemaker   . Seasonal allergies   . Skin cancer    "arms/hands" (12/19/2015)  . Sleep apnea    supposed to wear CPAP, but doesn't (12/19/2015)  . Type II diabetes mellitus ([place])     Past Surgical History:  Procedure Laterality Date  . CATARACT EXTRACTION W/PHACO Right 09/08/2012   Procedure: CATARACT EXTRACTION PHACO AND INTRAOCULAR LENS PLACEMENT (IOC);  Surgeon: Tonny Branch, MD;  Location: [facility];  Service: Ophthalmology;  Laterality: Right;  CDE: 21.28  . CATARACT EXTRACTION W/PHACO Left 12/08/2012   Procedure: CATARACT EXTRACTION PHACO  AND INTRAOCULAR LENS PLACEMENT (IOC);  Surgeon: Tonny Branch, MD;  Location: [facility];  Service: Ophthalmology;  Laterality: Left;  CDE:25.72  . [place]  . COLONOSCOPY     4 procedures  . COLONOSCOPY  2012   Dr. Sharlett Iles: diverticulosis, lipoma in ascending colon.   Consuela Mimes WITH FULGERATION N/A 08/11/2017   Procedure: [place] OF PROSTATIC VARICES;  Surgeon: Cleon Gustin, MD;  Location: [facility];  Service: Urology;  Laterality: N/A;  . EP IMPLANTABLE DEVICE N/A 12/19/2015   Procedure: BiV Pacemaker Insertion CRT-P;  Surgeon: Evans Lance, MD;  Location: [place] CV LAB;  Service: Cardiovascular;  Laterality: N/A;  . INSERT / REPLACE / REMOVE PACEMAKER  12/19/2015  . JOINT REPLACEMENT Left    knee  . POLYPECTOMY    . TONSILLECTOMY    . TOTAL KNEE ARTHROPLASTY Left 2000s  . TRANSURETHRAL RESECTION OF PROSTATE    . YAG LASER APPLICATION Right 8/46/9629   Procedure: YAG LASER APPLICATION;  Surgeon: [name], MD;  Location: [facility];  Service: Ophthalmology;  Laterality: Right;  . YAG LASER APPLICATION Left 09/01/4130   Procedure: YAG LASER APPLICATION;  Surgeon: [name], MD;  Location: [facility];  Service: Ophthalmology;  Laterality: Left;    Current Outpatient Medications  Medication Sig Dispense Refill  . allopurinol (  ZYLOPRIM) 100 MG tablet 2 qd for gout prevention 60 tablet 5  . blood glucose meter kit and supplies Dispense based on patient and insurance preference. Use to check blood sugar once daily as directed. For E11.9. 1 each 5  . clobetasol cream (TEMOVATE) 0.05 % APPLY LIGHTLY TO AFFECTEDUSKIN TWICE DAILY FOR 14EDAYS    . digoxin (LANOXIN) 0.125 MG tablet TAKE ONE TABLET BY MOUTH EVERY DAY 90 tablet 1  . ENTRESTO 24-26 MG TAKE ONE TABLET BY MOUTH 2 TIMES A DAY. 60 tablet 6  . finasteride (PROSCAR) 5 MG tablet TAKE ONE TABLET BY MOUTH ONCE DAILY PROSTATE. 90 tablet 1  . metFORMIN (GLUCOPHAGE) 500 MG tablet TAKE ONE TABLET BY  MOUTH DAILY. PLEASE CONTACT OFFICE FOR SUMMER APPOINTMENT 90 tablet 0  . metoprolol succinate (TOPROL-XL) 25 MG 24 hr tablet TAKE 1 TABLET BY MOUTH TWICE DAILY. 60 tablet 11  . simvastatin (ZOCOR) 40 MG tablet 1 qd 90 tablet 1  . tamsulosin (FLOMAX) 0.4 MG CAPS capsule Take 1 capsule (0.4 mg total) by mouth in the morning and at bedtime. Take one qhs 60 capsule 11  . torsemide (DEMADEX) 20 MG tablet TAKE 1 TABLET BY MOUTH TWICE A DAY. 60 tablet 6  . potassium chloride (KLOR-CON) 10 MEQ tablet Take 1 tablet (10 mEq total) by mouth daily. 90 tablet 3   No current facility-administered medications for this visit.    Allergies:   Bee venom    Social History:  The patient  reports that he has never smoked. He has never used smokeless tobacco. He reports previous alcohol use of about 2.0 standard drinks of alcohol per week. He reports that he does not use drugs.   Family History:  The patient's family history includes Lung cancer in his father.  He indicated that his mother is deceased. He indicated that his father is deceased. He indicated that the status of his neg hx is unknown.   ROS:  Please see the history of present illness. All other systems are reviewed and negative.    PHYSICAL EXAM: VS:  BP (!) 110/56   Pulse 89   Resp 16   Ht 6' 1" (1.854 m)   Wt 186 lb 6.4 oz (84.6 kg)   SpO2 97%   BMI 24.59 kg/m  , BMI Body mass index is 24.59 kg/m. GEN: Well nourished, well developed, male in no acute distress HEENT: normal for age  Neck: no JVD, no carotid bruit, no masses Cardiac: RRR; no murmur, no rubs, or gallops Respiratory:  clear to auscultation bilaterally, normal work of breathing GI: soft, nontender, nondistended, + BS MS: no deformity or atrophy; no edema; distal pulses are 2+ in all 4 extremities  Skin: warm and dry, no rash Neuro:  Strength and sensation are intact Psych: euthymic mood, full affect  EKG:  EKG is not ordered today. The recent device download showed  atrial fib 100% of the time, VVIR mode, Biv pacing > 97% of the time  ECHO: 01/06/2019 1. Left ventricular ejection fraction, by visual estimation, is 50 to  55%. The left ventricle has normal function. There is mildly increased  left ventricular hypertrophy.  2. Left ventricular diastolic Doppler parameters are indeterminate  pattern of LV diastolic filling.  3. Global right ventricle has normal systolic function.The right  ventricular size is normal. Moderately increased right ventricular wall  thickness.  4. Left atrial size was normal.  5. Right atrial size was moderately dilated.  6. The mitral valve is  normal in structure. No evidence of mitral valve  regurgitation. No evidence of mitral stenosis.  7. The tricuspid valve is normal in structure. Tricuspid valve  regurgitation is trivial.  8. The aortic valve has an indeterminant number of cusps Aortic valve  regurgitation was not visualized by color flow Doppler. Mild to moderate  aortic valve sclerosis/calcification without any evidence of aortic  stenosis.  9. The pulmonic valve was not well visualized. Pulmonic valve  regurgitation is not visualized by color flow Doppler.  10. Mildly elevated pulmonary artery systolic pressure.  11. The inferior vena cava is normal in size with greater than 50%  respiratory variability, suggesting right atrial pressure of 3 mmHg.  12. The interatrial septum was not well visualized.   CATH: 11/14/2008 HEMODYNAMIC DATA:  1. Right atrial pressure 7.  2. RV 38/9.  3. Pulmonary artery 37/18, mean 27.  4. Pulmonary capillary wedge 14.  5. Thermodilution cardiac output 4.0 liters per minute.  6. Thermodilution cardiac index 1.7 liters per minute per meter      squared.  7. Fick cardiac output 5.3 liters per minute.  8. Fick cardiac index 2.3 liters per minute per meter squared.  9. Aortic saturation 96%.  10.Pulmonary artery saturation 68%.  11.Superior vena cava saturation  66%.   ANGIOGRAPHIC DATA:  1. Ventriculography done in the RAO projection revealed preserved with      low normal overall ejection fraction, estimated EF would be in the      range of 50-55%.  On one beat, there was some diastolic mitral      regurgitation but systolic MR was not thought to be prominent.  No      definite wall motion abnormalities were visualized.  2. The right coronary artery is a moderate-sized vessel.  It is      relatively smooth with minimal luminal irregularity.  There is a      large PDA and 2 smaller posterolateral branches, both of which are      without critical narrowing.  3. The left main coronary artery is free of critical disease.  4. The LAD courses to the apex.  After a small diagonal and septal,      there is a little bit of calcified plaque measuring about 30%      luminal reduction.  The distal LAD tapers but is without critical      disease.  5. The circumflex provides predominantly 2 large marginal branches,      both of which are free of critical disease.   CONCLUSIONS:  1. Mild pulmonary hypertension.  2. No critical coronary artery disease.  3. Low normal overall ejection fraction.  4. Abnormal chest x-ray.  Recent Labs: 10/16/2019: ALT 11; BUN 20; Creatinine, Ser 1.35; Hemoglobin 13.2; Platelets 165; Potassium 4.9; Sodium 143  CBC    Component Value Date/Time   WBC 7.7 10/16/2019 1447   WBC 6.5 08/10/2017 1518   RBC 4.79 10/16/2019 1447   RBC 4.60 08/10/2017 1518   HGB 13.2 10/16/2019 1447   HCT 41.3 10/16/2019 1447   PLT 165 10/16/2019 1447   MCV 86 10/16/2019 1447   MCH 27.6 10/16/2019 1447   MCH 27.6 08/10/2017 1518   MCHC 32.0 10/16/2019 1447   MCHC 31.8 08/10/2017 1518   RDW 13.2 10/16/2019 1447   LYMPHSABS 0.9 10/16/2019 1447   MONOABS 0.4 08/10/2017 1518   EOSABS 0.3 10/16/2019 1447   BASOSABS 0.1 10/16/2019 1447   CMP Latest Ref Rng & Units  10/16/2019 05/05/2019 12/23/2018  Glucose 65 - 99 mg/dL 110(H) 149(H) 148(H)   BUN 8 - 27 mg/dL 20 21 37(H)  Creatinine 0.76 - 1.27 mg/dL 1.35(H) 1.36(H) 1.52(H)  Sodium 134 - 144 mmol/L 143 142 140  Potassium 3.5 - 5.2 mmol/L 4.9 4.3 4.2  Chloride 96 - 106 mmol/L 99 101 102  CO2 20 - 29 mmol/L _0 Calcium 8.6 - 10.2 mg/dL 9.2 9.2 9.0  Total Protein 6.0 - 8.5 g/dL 7.3 7.4 -  Total Bilirubin 0.0 - 1.2 mg/dL 0.4 0.4 -  Alkaline Phos 48 - 121 IU/L 58 85 -  AST 0 - 40 IU/L 13 19 -  ALT 0 - 44 IU/L 11 29 -     Lipid Panel Lab Results  Component Value Date   CHOL 157 10/16/2019   HDL 42 10/16/2019   LDLCALC 76 10/16/2019   TRIG 240 (H) 10/16/2019   CHOLHDL 3.7 10/16/2019      Wt Readings from Last 3 Encounters:  01/26/20 186 lb 6.4 oz (84.6 kg)  12/27/19 188 lb (85.3 kg)  11/22/19 188 lb (85.3 kg)     Other studies Reviewed: Additional studies/ records that were reviewed today include: Office notes, hospital records and testing.  ASSESSMENT AND PLAN:  1.  CHF:  -Wt is stable, as are sx - he is compliant w/ rx including Entresto, dig, BB, K+ and torsemide - K+ was 4.9 when last checked, will decrease K+ to once daily - He may be taking K+ only once daily. He will check and if he is already only taking it once daily, he needs to decrease to QOD - EF at last echo was low-nl, improved from the nadir of 24% - since volume stable and wt unchanged, do not need further testing  2. Atrial fib:  - HR is regular because of the BiV PPM - HR stable - not on anticoag 2nd hx colon polyps and risk of bleeding, plus age - continue PPM checks as scheduled   3. S/p MDT BiV PPM - pt is compliant w/ device checks. - last check reviewed, normal function - followed by Dr Lovena Le  Current medicines are reviewed at length with the patient today.  The patient does not have concerns regarding medicines.  The following changes have been made:  no change  Encouraged him to increase activity as tolerated  Labs/ tests ordered today include:  No orders of the  defined types were placed in this encounter.    Disposition:   FU with Rozann Lesches, MD  Signed, Rosaria Ferries, PA-C  01/26/2020 3:09 PM    [place] Phone: [phone]; Fax: [phone]

## 2020-01-26 ENCOUNTER — Encounter: Payer: Self-pay | Admitting: Physician Assistant

## 2020-01-26 ENCOUNTER — Other Ambulatory Visit: Payer: Self-pay

## 2020-01-26 ENCOUNTER — Ambulatory Visit (INDEPENDENT_AMBULATORY_CARE_PROVIDER_SITE_OTHER): Payer: Medicare Other | Admitting: Physician Assistant

## 2020-01-26 VITALS — BP 110/56 | HR 89 | Resp 16 | Ht 73.0 in | Wt 186.4 lb

## 2020-01-26 DIAGNOSIS — Z950 Presence of cardiac pacemaker: Secondary | ICD-10-CM

## 2020-01-26 DIAGNOSIS — I5022 Chronic systolic (congestive) heart failure: Secondary | ICD-10-CM

## 2020-01-26 DIAGNOSIS — I4811 Longstanding persistent atrial fibrillation: Secondary | ICD-10-CM

## 2020-01-26 MED ORDER — POTASSIUM CHLORIDE ER 10 MEQ PO TBCR: 10.0000 meq | EXTENDED_RELEASE_TABLET | Freq: Every day | ORAL | 3 refills | Status: DC

## 2020-01-26 NOTE — Patient Instructions (Signed)
Medication Instructions:  Decrease your potassium to one time daily  *If you need a refill on your cardiac medications before your next appointment, please call your pharmacy*   Lab Work: NO LABS    Testing/Procedures: NO TESTING   Follow-Up: At [facility], you and your health needs are our priority.  As part of our continuing mission to provide you with exceptional heart care, we have created designated Provider Care Teams.  These Care Teams include your primary Cardiologist (physician) and Advanced Practice Providers (APPs -  Physician Assistants and Nurse Practitioners) who all work together to provide you with the care you need, when you need it.  We recommend signing up for the patient portal called "MyChart".  Sign up information is provided on this After Visit Summary.  MyChart is used to connect with patients for Virtual Visits (Telemedicine).  Patients are able to view lab/test results, encounter notes, upcoming appointments, etc.  Non-urgent messages can be sent to your provider as well.   To learn more about what you can do with MyChart, go to [email or web address].    Your next appointment:   1 year(s)  The format for your next appointment:   In Person  Provider:   Rozann Lesches, MD   Other Instructions

## 2020-02-01 ENCOUNTER — Ambulatory Visit: Payer: Medicare Other | Attending: Internal Medicine

## 2020-02-01 DIAGNOSIS — Z23 Encounter for immunization: Secondary | ICD-10-CM

## 2020-02-01 NOTE — Progress Notes (Signed)
   Covid-19 Vaccination Clinic  Name:  Anthony Lucas    MRN: [id number] DOB: 06-17-1930  02/01/2020  [name] was observed post Covid-19 immunization for 15 minutes without incident. He was provided with Vaccine Information Sheet and instruction to access the V-Safe system.   [name] was instructed to call 911 with any severe reactions post vaccine: Marland Kitchen Difficulty breathing  . Swelling of face and throat  . A fast heartbeat  . A bad rash all over body  . Dizziness and weakness

## 2020-02-07 ENCOUNTER — Ambulatory Visit (INDEPENDENT_AMBULATORY_CARE_PROVIDER_SITE_OTHER): Payer: Medicare Other | Admitting: Urology

## 2020-02-07 ENCOUNTER — Other Ambulatory Visit: Payer: Self-pay

## 2020-02-07 ENCOUNTER — Encounter: Payer: Self-pay | Admitting: Urology

## 2020-02-07 VITALS — BP 107/57 | HR 82 | Temp 98.5°F | Ht 73.0 in | Wt 186.4 lb

## 2020-02-07 DIAGNOSIS — R339 Retention of urine, unspecified: Secondary | ICD-10-CM | POA: Diagnosis not present

## 2020-02-07 DIAGNOSIS — N3021 Other chronic cystitis with hematuria: Secondary | ICD-10-CM | POA: Diagnosis not present

## 2020-02-07 LAB — MICROSCOPIC EXAMINATION
Epithelial Cells (non renal): NONE SEEN /hpf (ref 0–10)
Renal Epithel, UA: NONE SEEN /hpf
WBC, UA: >30 /hpf — AB (ref 0–5)

## 2020-02-07 LAB — URINALYSIS, ROUTINE W REFLEX MICROSCOPIC
Bilirubin, UA: NEGATIVE
Glucose, UA: NEGATIVE
Ketones, UA: NEGATIVE
Nitrite, UA: POSITIVE — AB
Protein,UA: NEGATIVE
Specific Gravity, UA: 1.015 (ref 1.005–1.030)
Urobilinogen, Ur: 0.2 mg/dL (ref 0.2–1.0)
pH, UA: 5.5 (ref 5.0–7.5)

## 2020-02-07 LAB — BLADDER SCAN AMB NON-IMAGING: Scan Result: 475

## 2020-02-07 MED ORDER — SILODOSIN 8 MG PO CAPS: 8.0000 mg | ORAL_CAPSULE | Freq: Every day | ORAL | 11 refills | Status: DC

## 2020-02-07 NOTE — Progress Notes (Signed)
02/07/2020 2:58 PM   [name] 16-Dec-1930 [id number]  Referring provider: Kathyrn Drown, MD [address] [place],  [place] [address]  followup incomplete emptying and UTI  HPI: Mr Liburd is a 86yo here for followup for incomplete emptying and chronic cystitis. PVR 475cc. He noted no improvement with increasing flomax to BID.  Urinary frequency every 2 hours. Nocturia 3-5x. Stream fair. No dysuria   PMH: Past Medical History:  Diagnosis Date  . Arthritis    "right knee" (12/19/2015)  . CHF (congestive heart failure) ([place])    Abnormal echo January 2017  . History of colon polyps   . History of gout   . Hyperlipidemia   . Hypertension   . Myocardial infarction [facility])    "they said I'd had a heart attack but didn't know it" (12/19/2015)  . Presence of permanent cardiac pacemaker   . Seasonal allergies   . Skin cancer    "arms/hands" (12/19/2015)  . Sleep apnea    supposed to wear CPAP, but doesn't (12/19/2015)  . Type II diabetes mellitus ([place])     Surgical History: Past Surgical History:  Procedure Laterality Date  . CATARACT EXTRACTION W/PHACO Right 09/08/2012   Procedure: CATARACT EXTRACTION PHACO AND INTRAOCULAR LENS PLACEMENT (IOC);  Surgeon: Tonny Branch, MD;  Location: [facility];  Service: Ophthalmology;  Laterality: Right;  CDE: 21.28  . CATARACT EXTRACTION W/PHACO Left 12/08/2012   Procedure: CATARACT EXTRACTION PHACO AND INTRAOCULAR LENS PLACEMENT (IOC);  Surgeon: Tonny Branch, MD;  Location: [facility];  Service: Ophthalmology;  Laterality: Left;  CDE:25.72  . [place]  . COLONOSCOPY     4 procedures  . COLONOSCOPY  2012   Dr. Sharlett Iles: diverticulosis, lipoma in ascending colon.   Consuela Mimes WITH FULGERATION N/A 08/11/2017   Procedure: [place] OF PROSTATIC VARICES;  Surgeon: Cleon Gustin, MD;  Location: [facility];  Service: Urology;  Laterality: N/A;  . EP IMPLANTABLE DEVICE N/A 12/19/2015   Procedure: BiV Pacemaker  Insertion CRT-P;  Surgeon: Evans Lance, MD;  Location: [place] CV LAB;  Service: Cardiovascular;  Laterality: N/A;  . INSERT / REPLACE / REMOVE PACEMAKER  12/19/2015  . JOINT REPLACEMENT Left    knee  . POLYPECTOMY    . TONSILLECTOMY    . TOTAL KNEE ARTHROPLASTY Left 2000s  . TRANSURETHRAL RESECTION OF PROSTATE    . YAG LASER APPLICATION Right 2/75/1700   Procedure: YAG LASER APPLICATION;  Surgeon: [name], MD;  Location: [facility];  Service: Ophthalmology;  Laterality: Right;  . YAG LASER APPLICATION Left 1/74/9449   Procedure: YAG LASER APPLICATION;  Surgeon: [name], MD;  Location: [facility];  Service: Ophthalmology;  Laterality: Left;    Home Medications:  Allergies as of 02/07/2020      Reactions   Bee Venom Anaphylaxis   Honey bee       Medication List       Accurate as of February 07, 2020  2:58 PM. If you have any questions, ask your nurse or doctor.        allopurinol 100 MG tablet Commonly known as: ZYLOPRIM 2 qd for gout prevention   blood glucose meter kit and supplies Dispense based on patient and insurance preference. Use to check blood sugar once daily as directed. For E11.9.   clobetasol cream 0.05 % Commonly known as: TEMOVATE APPLY LIGHTLY TO AFFECTEDUSKIN TWICE DAILY FOR 14EDAYS   digoxin 0.125 MG tablet Commonly known as: LANOXIN TAKE  ONE TABLET BY MOUTH EVERY DAY   Entresto 24-26 MG Generic drug: sacubitril-valsartan TAKE ONE TABLET BY MOUTH 2 TIMES A DAY.   finasteride 5 MG tablet Commonly known as: PROSCAR TAKE ONE TABLET BY MOUTH ONCE DAILY PROSTATE.   metFORMIN 500 MG tablet Commonly known as: GLUCOPHAGE TAKE ONE TABLET BY MOUTH DAILY. PLEASE CONTACT OFFICE FOR SUMMER APPOINTMENT   metoprolol succinate 25 MG 24 hr tablet Commonly known as: TOPROL-XL TAKE 1 TABLET BY MOUTH TWICE DAILY.   potassium chloride 10 MEQ tablet Commonly known as: KLOR-CON Take 1 tablet (10 mEq total) by mouth daily.   simvastatin 40 MG  tablet Commonly known as: ZOCOR 1 qd   tamsulosin 0.4 MG Caps capsule Commonly known as: FLOMAX Take 1 capsule (0.4 mg total) by mouth in the morning and at bedtime. Take one qhs   torsemide 20 MG tablet Commonly known as: DEMADEX TAKE 1 TABLET BY MOUTH TWICE A DAY.       Allergies:  Allergies  Allergen Reactions  . Bee Venom Anaphylaxis    Honey bee     Family History: Family History  Problem Relation Age of Onset  . Lung cancer Father   . Colon cancer Neg Hx     Social History:  reports that he has never smoked. He has never used smokeless tobacco. He reports previous alcohol use of about 2.0 standard drinks of alcohol per week. He reports that he does not use drugs.  ROS: All other review of systems were reviewed and are negative except what is noted above in HPI  Physical Exam: BP (!) 107/57   Pulse 82   Temp 98.5 F (36.9 C)   Ht $R'6\' 1"'yp$  (1.854 m)   Wt 186 lb 6.4 oz (84.6 kg)   BMI 24.59 kg/m   Constitutional:  Alert and oriented, No acute distress. HEENT:  AT, moist mucus membranes.  Trachea midline, no masses. Cardiovascular: No clubbing, cyanosis, or edema. Respiratory: Normal respiratory effort, no increased work of breathing. GI: Abdomen is soft, nontender, nondistended, no abdominal masses GU: No CVA tenderness.  Lymph: No cervical or inguinal lymphadenopathy. Skin: No rashes, bruises or suspicious lesions. Neurologic: Grossly intact, no focal deficits, moving all 4 extremities. Psychiatric: Normal mood and affect.  Laboratory Data: Lab Results  Component Value Date   WBC 7.7 10/16/2019   HGB 13.2 10/16/2019   HCT 41.3 10/16/2019   MCV 86 10/16/2019   PLT 165 10/16/2019    Lab Results  Component Value Date   CREATININE 1.35 (H) 10/16/2019    No results found for: PSA  No results found for: TESTOSTERONE  Lab Results  Component Value Date   HGBA1C 7.6 (H) 10/16/2019    Urinalysis    Component Value Date/Time   APPEARANCEUR  Cloudy (A) 12/27/2019 1441   GLUCOSEU Negative 12/27/2019 1441   BILIRUBINUR Negative 12/27/2019 1441   PROTEINUR Negative 12/27/2019 1441   UROBILINOGEN 0.2 06/27/2019 1403   NITRITE Negative 12/27/2019 1441   LEUKOCYTESUR 3+ (A) 12/27/2019 1441    Lab Results  Component Value Date   LABMICR See below: 12/27/2019   WBCUA >30 (A) 12/27/2019   LABEPIT 0-10 12/27/2019   BACTERIA Many (A) 12/27/2019    Pertinent Imaging:  No results found for this or any previous visit.  No results found for this or any previous visit.  No results found for this or any previous visit.  No results found for this or any previous visit.  No results found for this  or any previous visit.  No results found for this or any previous visit.  No results found for this or any previous visit.  No results found for this or any previous visit.   Assessment & Plan:    1. Incomplete emptying of bladder We will trial rapaflo $RemoveBefor'8mg'[id number]$  qhs - BLADDER SCAN AMB NON-IMAGING - Urinalysis, Routine w reflex microscopic  2. Chronic cystitis with hematuria -We will continue observation. If we failed to improve his emptying with rapaflo we will start prophylactic antibiotics   No follow-ups on file.  Nicolette Bang, MD  [facility] [place]

## 2020-02-07 NOTE — Progress Notes (Signed)
Bladder Scan Patient can void: 475 ml Performed By: Tykwon Fera,LPn   Urological Symptom Review  Patient is experiencing the following symptoms: Frequent urination Hard to postpone urination Get up at night to urinate Leakage of urine Stream starts and stops   Review of Systems  Gastrointestinal (upper)  : Negative for upper GI symptoms  Gastrointestinal (lower) : Negative for lower GI symptoms  Constitutional : Negative for symptoms  Skin: Negative for skin symptoms  Eyes: Negative for eye symptoms  Ear/Nose/Throat : Negative for Ear/Nose/Throat symptoms  Hematologic/Lymphatic: Negative for Hematologic/Lymphatic symptoms  Cardiovascular : Negative for cardiovascular symptoms  Respiratory : Negative for respiratory symptoms  Endocrine: Negative for endocrine symptoms  Musculoskeletal: Negative for musculoskeletal symptoms  Neurological: Negative for neurological symptoms  Psychologic: Negative for psychiatric symptoms

## 2020-02-07 NOTE — Addendum Note (Signed)
Addended by: Iris Pert on: 02/07/2020 04:41 PM   Modules accepted: Orders

## 2020-02-07 NOTE — Patient Instructions (Signed)

## 2020-02-09 ENCOUNTER — Other Ambulatory Visit: Payer: Self-pay

## 2020-02-09 ENCOUNTER — Ambulatory Visit (INDEPENDENT_AMBULATORY_CARE_PROVIDER_SITE_OTHER): Payer: Medicare Other | Admitting: Family Medicine

## 2020-02-09 ENCOUNTER — Encounter: Payer: Self-pay | Admitting: Family Medicine

## 2020-02-09 VITALS — BP 116/64 | HR 55 | Temp 97.2°F | Ht 73.0 in | Wt 184.4 lb

## 2020-02-09 DIAGNOSIS — I5022 Chronic systolic (congestive) heart failure: Secondary | ICD-10-CM | POA: Diagnosis not present

## 2020-02-09 DIAGNOSIS — M1000 Idiopathic gout, unspecified site: Secondary | ICD-10-CM

## 2020-02-09 DIAGNOSIS — Z23 Encounter for immunization: Secondary | ICD-10-CM | POA: Diagnosis not present

## 2020-02-09 DIAGNOSIS — Z0000 Encounter for general adult medical examination without abnormal findings: Secondary | ICD-10-CM | POA: Diagnosis not present

## 2020-02-09 DIAGNOSIS — I10 Essential (primary) hypertension: Secondary | ICD-10-CM | POA: Diagnosis not present

## 2020-02-09 DIAGNOSIS — E785 Hyperlipidemia, unspecified: Secondary | ICD-10-CM | POA: Diagnosis not present

## 2020-02-09 DIAGNOSIS — E7849 Other hyperlipidemia: Secondary | ICD-10-CM

## 2020-02-09 DIAGNOSIS — Z79899 Other long term (current) drug therapy: Secondary | ICD-10-CM

## 2020-02-09 DIAGNOSIS — E1169 Type 2 diabetes mellitus with other specified complication: Secondary | ICD-10-CM

## 2020-02-09 NOTE — Progress Notes (Signed)
Subjective:    Patient ID: Anthony Lucas, male    DOB: 20-Aug-1930, 86 y.o.   MRN: [id number]  HPI AWV- Annual Wellness Visit  The patient was seen for their annual wellness visit. The patient's past medical history, surgical history, and family history were reviewed. Pertinent vaccines were reviewed ( tetanus, pneumonia, shingles, flu) The patient's medication list was reviewed and updated.  The height and weight were entered.  BMI recorded in electronic record elsewhere  Cognitive screening was completed. Outcome of Mini - Cog: pass   Falls /depression screening electronically recorded within record elsewhere  Current tobacco usage: none (All patients who use tobacco were given written and verbal information on quitting)  Recent listing of emergency department/hospitalizations over the past year were reviewed.  current specialist the patient sees on a regular basis: nephrologist, cardiologist, dermatology    Medicare annual wellness visit patient questionnaire was reviewed.  A written screening schedule for the patient for the next 5-10 years was given. Appropriate discussion of followup regarding next visit was discussed.  Chronic systolic heart failure (HCC) - Plan: Basic Metabolic Panel (BMET), Hemoglobin A1c, Digoxin level  Controlled type 2 diabetes mellitus with other specified complication, without long-term current use of insulin ([place]) - Plan: Basic Metabolic Panel (BMET), Hemoglobin A1c, Digoxin level  Other hyperlipidemia - Plan: Basic Metabolic Panel (BMET), Hemoglobin A1c, Digoxin level  Need for vaccination - Plan: Flu Vaccine QUAD High Dose(Fluad)  High risk medication use - Plan: Basic Metabolic Panel (BMET), Hemoglobin A1c, Digoxin level  Idiopathic gout, unspecified chronicity, unspecified site  Patient does take his cholesterol medicine regular basis. He is followed by urology on a regular basis for enlarged prostate history of prostate cancer Patient  does try to keep his A1c under good control by diet and is on Metformin he is compliant with the medicine Sees cardiology every 6 to 12 months is on heart failure medicine and currently feels like this is stable   Review of Systems  Constitutional: Negative for diaphoresis and fatigue.  HENT: Negative for congestion and rhinorrhea.   Respiratory: Negative for cough and shortness of breath.   Cardiovascular: Negative for chest pain and leg swelling.  Gastrointestinal: Negative for abdominal pain and diarrhea.  Skin: Negative for color change and rash.  Neurological: Negative for dizziness and headaches.  Psychiatric/Behavioral: Negative for behavioral problems and confusion.       Objective:   Physical Exam Vitals reviewed.  Constitutional:      General: He is not in acute distress. HENT:     Head: Normocephalic and atraumatic.  Eyes:     General:        Right eye: No discharge.        Left eye: No discharge.  Neck:     Trachea: No tracheal deviation.  Cardiovascular:     Rate and Rhythm: Normal rate and regular rhythm.     Heart sounds: Normal heart sounds. No murmur heard.   Pulmonary:     Effort: Pulmonary effort is normal. No respiratory distress.     Breath sounds: Normal breath sounds.  Lymphadenopathy:     Cervical: No cervical adenopathy.  Skin:    General: Skin is warm and dry.  Neurological:     Mental Status: He is alert.     Coordination: Coordination normal.  Psychiatric:        Behavior: Behavior normal.           Assessment & Plan:  1. Chronic systolic heart  failure ([place]) Heart failure under good control continue current measures.  See cardiology on a regular basis check digoxin level because of being on Lanoxin - Basic Metabolic Panel (BMET) - Hemoglobin A1c - Digoxin level  2. Controlled type 2 diabetes mellitus with other specified complication, without long-term current use of insulin ([place]) Takes his Metformin lab work is look good in the  past we will recheck labs watch diet minimize starches in diet stay active - Basic Metabolic Panel (BMET) - Hemoglobin A1c - Digoxin level  3. Other hyperlipidemia Takes his medication watches his diet continue current measures - Basic Metabolic Panel (BMET) - Hemoglobin A1c - Digoxin level  4. Need for vaccination Flu vaccine today - Flu Vaccine QUAD High Dose(Fluad)  5. High risk medication use Check labs - Basic Metabolic Panel (BMET) - Hemoglobin A1c - Digoxin level  6. Idiopathic gout, unspecified chronicity, unspecified site History of gout under good control  7. Encounter for subsequent annual wellness visit (AWV) in Medicare patient Adult wellness-complete.wellness physical was conducted today. Importance of diet and exercise were discussed in detail.  In addition to this a discussion regarding safety was also covered. We also reviewed over immunizations and gave recommendations regarding current immunization needed for age.  In addition to this additional areas were also touched on including: Preventative health exams needed:  Colonoscopy not indicated  Patient was advised yearly wellness exam Patient does have hyperlipidemia associated with diabetes on medication He will follow-up in the spring sooner if any issues

## 2020-02-10 DIAGNOSIS — E1169 Type 2 diabetes mellitus with other specified complication: Secondary | ICD-10-CM | POA: Insufficient documentation

## 2020-02-10 MED ORDER — METFORMIN HCL 500 MG PO TABS
ORAL_TABLET | ORAL | 1 refills | Status: DC
Start: 2020-02-10 — End: 2020-07-09

## 2020-02-10 MED ORDER — SIMVASTATIN 40 MG PO TABS
ORAL_TABLET | ORAL | 1 refills | Status: DC
Start: 2020-02-10 — End: 2020-06-03

## 2020-02-11 LAB — URINE CULTURE

## 2020-02-12 ENCOUNTER — Other Ambulatory Visit: Payer: Self-pay

## 2020-02-12 ENCOUNTER — Telehealth: Payer: Self-pay

## 2020-02-12 DIAGNOSIS — N390 Urinary tract infection, site not specified: Secondary | ICD-10-CM

## 2020-02-12 MED ORDER — SULFAMETHOXAZOLE-TRIMETHOPRIM 800-160 MG PO TABS: 1.0000 | ORAL_TABLET | Freq: Two times a day (BID) | ORAL | 0 refills | Status: DC

## 2020-02-12 NOTE — Telephone Encounter (Signed)
-----   Message from Cleon Gustin, MD sent at 02/12/2020  2:42 PM EST ----- Bactrim DS BID for 7 days ----- Message ----- From: Dorisann Frames, RN Sent: 02/12/2020  12:16 PM EST To: Cleon Gustin, MD  Please review

## 2020-02-12 NOTE — Telephone Encounter (Signed)
rx sent in . Pt notified.

## 2020-02-13 DIAGNOSIS — B351 Tinea unguium: Secondary | ICD-10-CM | POA: Diagnosis not present

## 2020-02-13 DIAGNOSIS — E1142 Type 2 diabetes mellitus with diabetic polyneuropathy: Secondary | ICD-10-CM | POA: Diagnosis not present

## 2020-02-27 DIAGNOSIS — Z79899 Other long term (current) drug therapy: Secondary | ICD-10-CM | POA: Diagnosis not present

## 2020-02-27 DIAGNOSIS — E7849 Other hyperlipidemia: Secondary | ICD-10-CM | POA: Diagnosis not present

## 2020-02-27 DIAGNOSIS — I5022 Chronic systolic (congestive) heart failure: Secondary | ICD-10-CM | POA: Diagnosis not present

## 2020-02-27 DIAGNOSIS — E1169 Type 2 diabetes mellitus with other specified complication: Secondary | ICD-10-CM | POA: Diagnosis not present

## 2020-02-28 LAB — BASIC METABOLIC PANEL
BUN/Creatinine Ratio: 14 (ref 10–24)
BUN: 17 mg/dL (ref 8–27)
CO2: 29 mmol/L (ref 20–29)
Calcium: 9 mg/dL (ref 8.6–10.2)
Chloride: 102 mmol/L (ref 96–106)
Creatinine, Ser: 1.23 mg/dL (ref 0.76–1.27)
GFR calc Af Amer: 61 mL/min/{1.73_m2} (ref >59)
GFR calc non Af Amer: 53 mL/min/{1.73_m2} — ABNORMAL LOW (ref >59)
Glucose: 120 mg/dL — ABNORMAL HIGH (ref 65–99)
Potassium: 4.3 mmol/L (ref 3.5–5.2)
Sodium: 143 mmol/L (ref 134–144)

## 2020-02-28 LAB — DIGOXIN LEVEL: Digoxin, Serum: 1 ng/mL — ABNORMAL HIGH (ref 0.5–0.9)

## 2020-02-28 LAB — HEMOGLOBIN A1C
Est. average glucose Bld gHb Est-mCnc: 171 mg/dL
Hgb A1c MFr Bld: 7.6 % — ABNORMAL HIGH (ref 4.8–5.6)

## 2020-03-02 ENCOUNTER — Other Ambulatory Visit: Payer: Self-pay | Admitting: Internal Medicine

## 2020-03-04 NOTE — Telephone Encounter (Signed)
This is a [place] pt.  °

## 2020-03-08 ENCOUNTER — Other Ambulatory Visit: Payer: Self-pay

## 2020-03-08 DIAGNOSIS — N390 Urinary tract infection, site not specified: Secondary | ICD-10-CM

## 2020-03-08 MED ORDER — SULFAMETHOXAZOLE-TRIMETHOPRIM 800-160 MG PO TABS: 1.0000 | ORAL_TABLET | Freq: Two times a day (BID) | ORAL | 0 refills | Status: DC

## 2020-03-10 ENCOUNTER — Telehealth: Payer: Self-pay | Admitting: Physician Assistant

## 2020-03-10 NOTE — Telephone Encounter (Signed)
   Lab results reviewed.  His digoxin level was borderline elevated.  Dr. Domenic Polite and Dr. Lovena Le both reviewed the results and felt that he could stop the digoxin.  I called Anthony Lucas and advised him of this.  He will take the medication out of his pillbox and stop it immediately.  No other issues, keep all scheduled follow-up appointments.  Rosaria Ferries, PA-C 03/10/2020 1:49 PM

## 2020-04-02 ENCOUNTER — Other Ambulatory Visit: Payer: Self-pay | Admitting: Family Medicine

## 2020-04-11 ENCOUNTER — Other Ambulatory Visit: Payer: Self-pay

## 2020-04-11 ENCOUNTER — Ambulatory Visit (INDEPENDENT_AMBULATORY_CARE_PROVIDER_SITE_OTHER): Payer: Medicare Other | Admitting: Urology

## 2020-04-11 ENCOUNTER — Encounter: Payer: Self-pay | Admitting: Urology

## 2020-04-11 VITALS — BP 139/87 | HR 73 | Temp 97.9°F | Ht 73.0 in | Wt 185.0 lb

## 2020-04-11 DIAGNOSIS — R339 Retention of urine, unspecified: Secondary | ICD-10-CM

## 2020-04-11 DIAGNOSIS — N3021 Other chronic cystitis with hematuria: Secondary | ICD-10-CM

## 2020-04-11 MED ORDER — SILODOSIN 8 MG PO CAPS: 8.0000 mg | ORAL_CAPSULE | Freq: Two times a day (BID) | ORAL | 11 refills | Status: DC

## 2020-04-11 MED ORDER — FINASTERIDE 5 MG PO TABS: ORAL_TABLET | ORAL | 3 refills | Status: DC

## 2020-04-11 NOTE — Progress Notes (Signed)
Bladder Scan Patient can void: 555 ml Performed By: Sufian Ravi,lpn   Urological Symptom Review  Patient is experiencing the following symptoms: Frequent urination Hard to postpone urination Get up at night to urinate Leakage of urine Stream starts and stops   Review of Systems  Gastrointestinal (upper)  : Negative for upper GI symptoms  Gastrointestinal (lower) : Negative for lower GI symptoms  Constitutional : Negative for symptoms  Skin: Negative for skin symptoms  Eyes: Negative for eye symptoms  Ear/Nose/Throat : Negative for Ear/Nose/Throat symptoms  Hematologic/Lymphatic: Negative for Hematologic/Lymphatic symptoms  Cardiovascular : Negative for cardiovascular symptoms  Respiratory : Negative for respiratory symptoms  Endocrine: Negative for endocrine symptoms  Musculoskeletal: Negative for musculoskeletal symptoms  Neurological: Negative for neurological symptoms  Psychologic: Negative for psychiatric symptoms

## 2020-04-11 NOTE — Progress Notes (Signed)
04/11/2020 2:44 PM   [name] [birth year] [id number]  Referring provider: Kathyrn Drown, MD [address] [place],  [place] [address]  Incomplete emptying  HPI: Anthony Lucas is a 87yo here for followup for incomplete emptying. PVR 555cc on rapaflo 81m. Stream is stronger. Urinary frequency every 2 hours. Nocturia 1-3x. He continues to have dribbling and wears 1 pad per day. No UTIs since last visit   PMH: Past Medical History:  Diagnosis Date  . Arthritis    "right knee" (12/19/2015)  . CHF (congestive heart failure) [place]    Abnormal echo January 2017  . History of colon polyps   . History of gout   . Hyperlipidemia   . Hypertension   . Myocardial infarction ([facility]    "they said I'd had a heart attack but didn't know it" (12/19/2015)  . Presence of permanent cardiac pacemaker   . Seasonal allergies   . Skin cancer    "arms/hands" (12/19/2015)  . Sleep apnea    supposed to wear CPAP, but doesn't (12/19/2015)  . Type II diabetes mellitus ([place]     Surgical History: Past Surgical History:  Procedure Laterality Date  . CATARACT EXTRACTION W/PHACO Right 09/08/2012   Procedure: CATARACT EXTRACTION PHACO AND INTRAOCULAR LENS PLACEMENT (IOC);  Surgeon: KTonny Branch MD;  Location: [facility];  Service: Ophthalmology;  Laterality: Right;  CDE: 21.28  . CATARACT EXTRACTION W/PHACO Left 12/08/2012   Procedure: CATARACT EXTRACTION PHACO AND INTRAOCULAR LENS PLACEMENT (IOC);  Surgeon: KTonny Branch MD;  Location: [facility];  Service: Ophthalmology;  Laterality: Left;  CDE:25.72  . [place] . COLONOSCOPY     4 procedures  . COLONOSCOPY  2012   Dr. PSharlett Iles diverticulosis, lipoma in ascending colon.   .Consuela MimesWITH FULGERATION N/A 08/11/2017   Procedure: C[place] CloudOF PROSTATIC VARICES;  Surgeon: MCleon Gustin MD;  Location: [facility];  Service: Urology;  Laterality: N/A;  . EP IMPLANTABLE DEVICE N/A 12/19/2015   Procedure: BiV Pacemaker  Insertion CRT-P;  Surgeon: GEvans Lance MD;  [facility];  Service: Cardiovascular;  Laterality: N/A;  . INSERT / REPLACE / REMOVE PACEMAKER  12/19/2015  . JOINT REPLACEMENT Left    knee  . POLYPECTOMY    . TONSILLECTOMY    . TOTAL KNEE ARTHROPLASTY Left 2000s  . TRANSURETHRAL RESECTION OF PROSTATE    . YAG LASER APPLICATION Right 36/75/4492  Procedure: YAG LASER APPLICATION;  Surgeon: MRutherford Guys MD;  Location: [facility] ORS;  Service: Ophthalmology;  Laterality: Right;  . YAG LASER APPLICATION Left 30/01/711  Procedure: YAG LASER APPLICATION;  Surgeon: MRutherford Guys MD;  Location: [facility];  Service: Ophthalmology;  Laterality: Left;    Home Medications:  Allergies as of 04/11/2020      Reactions   Bee Venom Anaphylaxis   Honey bee       Medication List       Accurate as of April 11, 2020  2:44 PM. If you have any questions, ask your nurse or doctor.        allopurinol 100 MG tablet Commonly known as: ZYLOPRIM 2 qd for gout prevention   blood glucose meter kit and supplies Dispense based on patient and insurance preference. Use to check blood sugar once daily as directed. For E11.9.   clobetasol cream 0.05 % Commonly known as: TEMOVATE APPLY LIGHTLY TO AFFECTEDUSKIN TWICE DAILY FOR 14EDAYS   Entresto 24-26 MG Generic drug: sacubitril-valsartan TAKE  ONE TABLET BY MOUTH 2 TIMES A DAY.   finasteride 5 MG tablet Commonly known as: PROSCAR TAKE ONE TABLET BY MOUTH ONCE DAILY   metFORMIN 500 MG tablet Commonly known as: GLUCOPHAGE TAKE ONE TABLET BY MOUTH DAILY. PLEASE CONTACT OFFICE FOR SUMMER APPOINTMENT   metoprolol succinate 25 MG 24 hr tablet Commonly known as: TOPROL-XL TAKE 1 TABLET BY MOUTH TWICE DAILY.   potassium chloride 10 MEQ tablet Commonly known as: KLOR-CON Take 1 tablet (10 mEq total) by mouth daily.   silodosin 8 MG Caps capsule Commonly known as: RAPAFLO Take 1 capsule (8 mg total) by mouth at bedtime.   simvastatin 40 MG  tablet Commonly known as: ZOCOR 1 qd   sulfamethoxazole-trimethoprim 800-160 MG tablet Commonly known as: BACTRIM DS Take 1 tablet by mouth every 12 (twelve) hours.   torsemide 20 MG tablet Commonly known as: DEMADEX TAKE 1 TABLET BY MOUTH TWICE A DAY.       Allergies:  Allergies  Allergen Reactions  . Bee Venom Anaphylaxis    Honey bee     Family History: Family History  Problem Relation Age of Onset  . Lung cancer Father   . Colon cancer Neg Hx     Social History:  reports that he has never smoked. He has never used smokeless tobacco. He reports previous alcohol use of about 2.0 standard drinks of alcohol per week. He reports that he does not use drugs.  ROS: All other review of systems were reviewed and are negative except what is noted above in HPI  Physical Exam: BP 139/87   Pulse 73   Temp 97.9 F (36.6 C)   Ht _0  (1.854 m)   Wt 185 lb (83.9 kg)   BMI 24.41 kg/m   Constitutional:  Alert and oriented, No acute distress. HEENT: [place] AT, moist mucus membranes.  Trachea midline, no masses. Cardiovascular: No clubbing, cyanosis, or edema. Respiratory: Normal respiratory effort, no increased work of breathing. GI: Abdomen is soft, nontender, nondistended, no abdominal masses GU: No CVA tenderness.  Lymph: No cervical or inguinal lymphadenopathy. Skin: No rashes, bruises or suspicious lesions. Neurologic: Grossly intact, no focal deficits, moving all 4 extremities. Psychiatric: Normal mood and affect.  Laboratory Data: Lab Results  Component Value Date   WBC 7.7 10/16/2019   HGB 13.2 10/16/2019   HCT 41.3 10/16/2019   MCV 86 10/16/2019   PLT 165 10/16/2019    Lab Results  Component Value Date   CREATININE 1.23 02/27/2020    No results found for: PSA  No results found for: TESTOSTERONE  Lab Results  Component Value Date   HGBA1C 7.6 (H) 02/27/2020    Urinalysis    Component Value Date/Time   APPEARANCEUR Cloudy (A) 02/07/2020 1518    GLUCOSEU Negative 02/07/2020 1518   BILIRUBINUR Negative 02/07/2020 1518   PROTEINUR Negative 02/07/2020 1518   UROBILINOGEN 0.2 06/27/2019 1403   NITRITE Positive (A) 02/07/2020 1518   LEUKOCYTESUR 3+ (A) 02/07/2020 1518    Lab Results  Component Value Date   LABMICR See below: 02/07/2020   WBCUA >30 (A) 02/07/2020   LABEPIT None seen 02/07/2020   BACTERIA Many (A) 02/07/2020    Pertinent Imaging:  No results found for this or any previous visit.  No results found for this or any previous visit.  No results found for this or any previous visit.  No results found for this or any previous visit.  No results found for this or any previous visit.  No results found for this or any previous visit.  No results found for this or any previous visit.  No results found for this or any previous visit.   Assessment & Plan:    1. Incomplete emptying of bladder -We will increase rapaflo to BID - Urinalysis, Routine w reflex microscopic - BLADDER SCAN AMB NON-IMAGING  2. Chronic cystitis with hematuria -continue observation   No follow-ups on file.  Nicolette Bang, MD  [facility]rology [place]

## 2020-04-11 NOTE — Patient Instructions (Signed)

## 2020-04-12 LAB — URINALYSIS, ROUTINE W REFLEX MICROSCOPIC
Bilirubin, UA: NEGATIVE
Glucose, UA: NEGATIVE
Ketones, UA: NEGATIVE
Nitrite, UA: NEGATIVE
Protein,UA: NEGATIVE
Specific Gravity, UA: 1.015 (ref 1.005–1.030)
Urobilinogen, Ur: 0.2 mg/dL (ref 0.2–1.0)
pH, UA: 5.5 (ref 5.0–7.5)

## 2020-04-12 LAB — MICROSCOPIC EXAMINATION
RBC, Urine: NONE SEEN /hpf (ref 0–2)
WBC, UA: >30 /hpf — ABNORMAL HIGH (ref 0–5)

## 2020-04-14 LAB — URINE CULTURE

## 2020-04-16 ENCOUNTER — Telehealth: Payer: Self-pay

## 2020-04-16 ENCOUNTER — Other Ambulatory Visit: Payer: Self-pay

## 2020-04-16 ENCOUNTER — Telehealth: Payer: Self-pay | Admitting: Physician Assistant

## 2020-04-16 LAB — CUP PACEART REMOTE DEVICE CHECK
Battery Remaining Longevity: 5 mo
Battery Voltage: 2.74 V
Brady Statistic AP VP Percent: 0 %
Brady Statistic AP VS Percent: 0 %
Brady Statistic AS VP Percent: 98.08 %
Brady Statistic AS VS Percent: 1.92 %
Brady Statistic RA Percent Paced: 0 %
Brady Statistic RV Percent Paced: 98.07 %
Date Time Interrogation Session: 20220111005017
Implantable Lead Implant Date: 20170914
Implantable Lead Implant Date: 20170914
Implantable Lead Implant Date: 20170914
Implantable Lead Location: 753858
Implantable Lead Location: 753859
Implantable Lead Location: 753860
Implantable Lead Model: 4598
Implantable Lead Model: 5076
Implantable Lead Model: 5076
Implantable Pulse Generator Implant Date: 20170914
Lead Channel Impedance Value: 323 Ohm
Lead Channel Impedance Value: 380 Ohm
Lead Channel Impedance Value: 418 Ohm
Lead Channel Impedance Value: 418 Ohm
Lead Channel Impedance Value: 475 Ohm
Lead Channel Impedance Value: 494 Ohm
Lead Channel Impedance Value: 532 Ohm
Lead Channel Impedance Value: 570 Ohm
Lead Channel Impedance Value: 589 Ohm
Lead Channel Impedance Value: 684 Ohm
Lead Channel Impedance Value: 760 Ohm
Lead Channel Impedance Value: 893 Ohm
Lead Channel Impedance Value: 893 Ohm
Lead Channel Impedance Value: 969 Ohm
Lead Channel Pacing Threshold Amplitude: 0.5 V
Lead Channel Pacing Threshold Amplitude: 0.625 V
Lead Channel Pacing Threshold Amplitude: 5 V
Lead Channel Pacing Threshold Pulse Width: 0.4 ms
Lead Channel Pacing Threshold Pulse Width: 0.4 ms
Lead Channel Pacing Threshold Pulse Width: 1.5 ms
Lead Channel Sensing Intrinsic Amplitude: 0.75 mV
Lead Channel Sensing Intrinsic Amplitude: 2.375 mV
Lead Channel Sensing Intrinsic Amplitude: 9.75 mV
Lead Channel Sensing Intrinsic Amplitude: 9.75 mV
Lead Channel Setting Pacing Amplitude: 2.5 V
Lead Channel Setting Pacing Amplitude: 5 V
Lead Channel Setting Pacing Pulse Width: 0.4 ms
Lead Channel Setting Pacing Pulse Width: 1.5 ms
Lead Channel Setting Sensing Sensitivity: 2.8 mV

## 2020-04-16 MED ORDER — NITROFURANTOIN MONOHYD MACRO 100 MG PO CAPS
100.0000 mg | ORAL_CAPSULE | Freq: Two times a day (BID) | ORAL | 0 refills | Status: DC
Start: 2020-04-16 — End: 2020-05-07

## 2020-04-16 NOTE — Telephone Encounter (Signed)
-----   Message from Cleon Gustin, MD sent at 04/16/2020 10:32 AM EST ----- Macrobid 100mg  BID for 7 days ----- Message ----- From: Valentina Lucks, LPN Sent: 9/57/4734   9:19 AM EST To: Cleon Gustin, MD  Pls review.

## 2020-04-16 NOTE — Telephone Encounter (Signed)
remote reviewed. battery estimate to ERI is 20mo LV threshold is high, appears chronic. Optivol is up Called and spoke to the patient's daughter, the patient can hear me as well. he is feeling "OK", no CP or overt SOB, but does feel bloated and has gained about 5lbs or so in the last week or 2. They confirm he has stopped the dig as instructed back in Nov, and is taking his torsemide BID and K+ daily as written. (as well as his other medicines.) I instructed them to take Torsemide 2tabs in the AM (40mg ) and one PM (20mg ) and K+ 10meq BID for those two days as well.  then resume his usual dose/schedule. last BMET was stable I have messaged the scheduler to call and make follow up in the next week or so to follow up. I have messaged the device clinic in box to put him on monthly battery checks.  Tommye Standard, PA-C

## 2020-04-16 NOTE — Telephone Encounter (Signed)
Pt notified abx sent in.

## 2020-05-07 ENCOUNTER — Encounter: Payer: Self-pay | Admitting: Internal Medicine

## 2020-05-07 ENCOUNTER — Other Ambulatory Visit: Payer: Self-pay

## 2020-05-07 ENCOUNTER — Ambulatory Visit (INDEPENDENT_AMBULATORY_CARE_PROVIDER_SITE_OTHER): Payer: Medicare Other | Admitting: Internal Medicine

## 2020-05-07 VITALS — BP 130/68 | HR 84 | Ht 73.5 in | Wt 187.0 lb

## 2020-05-07 DIAGNOSIS — I5022 Chronic systolic (congestive) heart failure: Secondary | ICD-10-CM | POA: Diagnosis not present

## 2020-05-07 DIAGNOSIS — I4811 Longstanding persistent atrial fibrillation: Secondary | ICD-10-CM

## 2020-05-07 NOTE — Progress Notes (Signed)
HPI Mr. Hubbert returns today for followup. He has a h/o chronic systolic heartf ailure, s/p biv PPM insertion. His LV lead is chronically elevated in pacing threshold. His other vectors have diaghragmatic stimulation.  He has developed atrial fib with a controlled Vr. The patient has had worsening CHF symptoms. He has had problems with bleeding on systemic anti-coagulation. He had his diuretic switched from lasix to torsemide.He has done surprisingly well since his last visit. He admits to sodium indiscretion. He is approaching ERI.  Allergies  Allergen Reactions  . Bee Venom Anaphylaxis    Honey bee      Current Outpatient Medications  Medication Sig Dispense Refill  . allopurinol (ZYLOPRIM) 100 MG tablet 2 qd for gout prevention 60 tablet 5  . blood glucose meter kit and supplies Dispense based on patient and insurance preference. Use to check blood sugar once daily as directed. For E11.9. 1 each 5  . clobetasol cream (TEMOVATE) 0.05 % APPLY LIGHTLY TO AFFECTEDUSKIN TWICE DAILY FOR 14EDAYS    . ENTRESTO 24-26 MG TAKE ONE TABLET BY MOUTH 2 TIMES A DAY. 60 tablet 6  . finasteride (PROSCAR) 5 MG tablet TAKE ONE TABLET BY MOUTH ONCE DAILY 90 tablet 3  . metFORMIN (GLUCOPHAGE) 500 MG tablet TAKE ONE TABLET BY MOUTH DAILY. PLEASE CONTACT OFFICE FOR SUMMER APPOINTMENT 90 tablet 1  . metoprolol succinate (TOPROL-XL) 25 MG 24 hr tablet TAKE 1 TABLET BY MOUTH TWICE DAILY. 60 tablet 11  . potassium chloride (KLOR-CON) 10 MEQ tablet Take 1 tablet (10 mEq total) by mouth daily. 90 tablet 3  . silodosin (RAPAFLO) 8 MG CAPS capsule Take 1 capsule (8 mg total) by mouth in the morning and at bedtime. 60 capsule 11  . simvastatin (ZOCOR) 40 MG tablet 1 qd 90 tablet 1  . torsemide (DEMADEX) 20 MG tablet TAKE 1 TABLET BY MOUTH TWICE A DAY. 60 tablet 6   No current facility-administered medications for this visit.     Past Medical History:  Diagnosis Date  . Arthritis    "right knee" (12/19/2015)   . CHF (congestive heart failure) ([place])    Abnormal echo January 2017  . History of colon polyps   . History of gout   . Hyperlipidemia   . Hypertension   . Myocardial infarction [facility])    "they said I'd had a heart attack but didn't know it" (12/19/2015)  . Presence of permanent cardiac pacemaker   . Seasonal allergies   . Skin cancer    "arms/hands" (12/19/2015)  . Sleep apnea    supposed to wear CPAP, but doesn't (12/19/2015)  . Type II diabetes mellitus (HCC)     ROS:   All systems reviewed and negative except as noted in the HPI.   Past Surgical History:  Procedure Laterality Date  . CATARACT EXTRACTION W/PHACO Right 09/08/2012   Procedure: CATARACT EXTRACTION PHACO AND INTRAOCULAR LENS PLACEMENT (IOC);  Surgeon: Tonny Branch, MD;  Location: [facility];  Service: Ophthalmology;  Laterality: Right;  CDE: 21.28  . CATARACT EXTRACTION W/PHACO Left 12/08/2012   Procedure: CATARACT EXTRACTION PHACO AND INTRAOCULAR LENS PLACEMENT (IOC);  Surgeon: Tonny Branch, MD;  Location: [facility];  Service: Ophthalmology;  Laterality: Left;  CDE:25.72  . [place]  . COLONOSCOPY     4 procedures  . COLONOSCOPY  2012   Dr. Sharlett Iles: diverticulosis, lipoma in ascending colon.   . CYSTOSCOPY WITH FULGERATION N/A 08/11/2017   Procedure: CYSTOSCOPY WITH FULGERATION OF PROSTATIC  VARICES;  Surgeon: Cleon Gustin, MD;  Location: [facility];  Service: Urology;  Laterality: N/A;  . EP IMPLANTABLE DEVICE N/A 12/19/2015   Procedure: BiV Pacemaker Insertion CRT-P;  Surgeon: Evans Lance, MD;  Location: [place] CV LAB;  Service: Cardiovascular;  Laterality: N/A;  . INSERT / REPLACE / REMOVE PACEMAKER  12/19/2015  . JOINT REPLACEMENT Left    knee  . POLYPECTOMY    . TONSILLECTOMY    . TOTAL KNEE ARTHROPLASTY Left 2000s  . TRANSURETHRAL RESECTION OF PROSTATE    . YAG LASER APPLICATION Right 2/59/5638   Procedure: YAG LASER APPLICATION;  Surgeon: [name], MD;  Location: [facility] ORS;  Service:  Ophthalmology;  Laterality: Right;  . YAG LASER APPLICATION Left 7/56/4332   Procedure: YAG LASER APPLICATION;  Surgeon: [name], MD;  Location: [facility] ORS;  Service: Ophthalmology;  Laterality: Left;     Family History  Problem Relation Age of Onset  . Lung cancer Father   . Colon cancer Neg Hx      Social History   Socioeconomic History  . Marital status: Widowed    Spouse name: Not on file  . Number of children: Not on file  . Years of education: Not on file  . Highest education level: Not on file  Occupational History  . Not on file  Tobacco Use  . Smoking status: Never Smoker  . Smokeless tobacco: Never Used  Vaping Use  . Vaping Use: Never used  Substance and Sexual Activity  . Alcohol use: Not Currently    Alcohol/week: 2.0 standard drinks    Types: 2 Glasses of wine per week  . Drug use: No  . Sexual activity: Not Currently    Birth control/protection: None  Other Topics Concern  . Not on file  Social History Narrative  . Not on file   Social Determinants of Health   Financial Resource Strain: Not on file  Food Insecurity: Not on file  Transportation Needs: Not on file  Physical Activity: Not on file  Stress: Not on file  Social Connections: Not on file  Intimate Partner Violence: Not on file     BP 130/68   Pulse 84   Ht 6' 1.5" (1.867 m)   Wt 187 lb (84.8 kg)   SpO2 97%   BMI 24.34 kg/m   Physical Exam:  Well appearing NAD HEENT: Unremarkable Neck:  No JVD, no thyromegally Lymphatics:  No adenopathy Back:  No CVA tenderness Lungs:  Clear with no wheezes HEART:  Regular rate rhythm, no murmurs, no rubs, no clicks Abd:  soft, positive bowel sounds, no organomegally, no rebound, no guarding Ext:  2 plus pulses, no edema, no cyanosis, no clubbing Skin:  No rashes no nodules Neuro:  CN II through XII intact, motor grossly intact   DEVICE  Normal device function except for elevated LV pacing threshold.  See PaceArt for details.    Assess/Plan: 1. Chronic systolic heart failure - his symptoms are class 2 on medical therapy. He will follow. 2. BIV PPM - his device is approaching ERI. I discussed the treatment options and would recommend left bundle area pacing though I discussed epicardial lead placement as well as a straight gen change out. We will plan for left bundle area pacing if his LSCV is patent. 3. CHB - he has no escape today, s/p PPM. 4. Atrial fib - his VR is well controlled. He will continue his current meds. He is not a candidate for systemic  anti-coag due to bleeding.   Carleene Overlie Taylor,MD

## 2020-05-07 NOTE — Patient Instructions (Signed)
Medication Instructions:  Your physician recommends that you continue on your current medications as directed. Please refer to the Current Medication list given to you today.  *If you need a refill on your cardiac medications before your next appointment, please call your pharmacy*   Lab Work: NONE   If you have labs (blood work) drawn today and your tests are completely normal, you will receive your results only by: Marland Kitchen MyChart Message (if you have MyChart) OR . A paper copy in the mail If you have any lab test that is abnormal or we need to change your treatment, we will call you to review the results.   Testing/Procedures: NONE    Follow-Up: At [facility], you and your health needs are our priority.  As part of our continuing mission to provide you with exceptional heart care, we have created designated Provider Care Teams.  These Care Teams include your primary Cardiologist (physician) and Advanced Practice Providers (APPs -  Physician Assistants and Nurse Practitioners) who all work together to provide you with the care you need, when you need it.  We recommend signing up for the patient portal called "MyChart".  Sign up information is provided on this After Visit Summary.  MyChart is used to connect with patients for Virtual Visits (Telemedicine).  Patients are able to view lab/test results, encounter notes, upcoming appointments, etc.  Non-urgent messages can be sent to your provider as well.   To learn more about what you can do with MyChart, go to [email or web address].    Your next appointment:    Pending device replacement   The format for your next appointment:   In Person  Provider:   Cristopher Peru, MD   Other Instructions Thank you for choosing [facility]!

## 2020-05-10 DIAGNOSIS — L851 Acquired keratosis [keratoderma] palmaris et plantaris: Secondary | ICD-10-CM | POA: Diagnosis not present

## 2020-05-10 DIAGNOSIS — B351 Tinea unguium: Secondary | ICD-10-CM | POA: Diagnosis not present

## 2020-05-10 DIAGNOSIS — E1142 Type 2 diabetes mellitus with diabetic polyneuropathy: Secondary | ICD-10-CM | POA: Diagnosis not present

## 2020-06-03 ENCOUNTER — Other Ambulatory Visit: Payer: Self-pay | Admitting: Family Medicine

## 2020-06-17 ENCOUNTER — Ambulatory Visit (INDEPENDENT_AMBULATORY_CARE_PROVIDER_SITE_OTHER): Payer: Medicare Other

## 2020-06-17 DIAGNOSIS — I43 Cardiomyopathy in diseases classified elsewhere: Secondary | ICD-10-CM

## 2020-06-19 LAB — CUP PACEART REMOTE DEVICE CHECK
Battery Remaining Longevity: 2 mo
Battery Voltage: 2.63 V
Brady Statistic AP VP Percent: 0 %
Brady Statistic AP VS Percent: 0 %
Brady Statistic AS VP Percent: 98.22 %
Brady Statistic AS VS Percent: 1.78 %
Brady Statistic RA Percent Paced: 0 %
Brady Statistic RV Percent Paced: 98.22 %
Date Time Interrogation Session: 20220314014156
Implantable Lead Implant Date: 20170914
Implantable Lead Implant Date: 20170914
Implantable Lead Implant Date: 20170914
Implantable Lead Location: 753858
Implantable Lead Location: 753859
Implantable Lead Location: 753860
Implantable Lead Model: 4598
Implantable Lead Model: 5076
Implantable Lead Model: 5076
Implantable Pulse Generator Implant Date: 20170914
Lead Channel Impedance Value: 1026 Ohm
Lead Channel Impedance Value: 323 Ohm
Lead Channel Impedance Value: 456 Ohm
Lead Channel Impedance Value: 475 Ohm
Lead Channel Impedance Value: 475 Ohm
Lead Channel Impedance Value: 532 Ohm
Lead Channel Impedance Value: 532 Ohm
Lead Channel Impedance Value: 551 Ohm
Lead Channel Impedance Value: 627 Ohm
Lead Channel Impedance Value: 646 Ohm
Lead Channel Impedance Value: 798 Ohm
Lead Channel Impedance Value: 874 Ohm
Lead Channel Impedance Value: 950 Ohm
Lead Channel Impedance Value: 969 Ohm
Lead Channel Pacing Threshold Amplitude: 0.625 V
Lead Channel Pacing Threshold Amplitude: 0.625 V
Lead Channel Pacing Threshold Amplitude: 5 V
Lead Channel Pacing Threshold Pulse Width: 0.4 ms
Lead Channel Pacing Threshold Pulse Width: 0.4 ms
Lead Channel Pacing Threshold Pulse Width: 1.5 ms
Lead Channel Sensing Intrinsic Amplitude: 0.75 mV
Lead Channel Sensing Intrinsic Amplitude: 11.875 mV
Lead Channel Sensing Intrinsic Amplitude: 11.875 mV
Lead Channel Sensing Intrinsic Amplitude: 2.375 mV
Lead Channel Setting Pacing Amplitude: 2.5 V
Lead Channel Setting Pacing Amplitude: 5 V
Lead Channel Setting Pacing Pulse Width: 0.4 ms
Lead Channel Setting Pacing Pulse Width: 1.5 ms
Lead Channel Setting Sensing Sensitivity: 2.8 mV

## 2020-06-20 ENCOUNTER — Ambulatory Visit: Payer: Medicare Other | Admitting: Urology

## 2020-06-25 NOTE — Progress Notes (Signed)
Remote pacemaker transmission.   

## 2020-06-25 NOTE — Addendum Note (Signed)
Addended by: Cheri Kearns A on: 06/25/2020 01:59 PM   Modules accepted: Level of Service

## 2020-06-28 ENCOUNTER — Ambulatory Visit: Payer: Medicare Other | Admitting: Urology

## 2020-07-02 ENCOUNTER — Ambulatory Visit: Payer: Medicare Other | Admitting: Urology

## 2020-07-09 ENCOUNTER — Encounter: Payer: Self-pay | Admitting: Family Medicine

## 2020-07-09 ENCOUNTER — Other Ambulatory Visit: Payer: Self-pay

## 2020-07-09 ENCOUNTER — Ambulatory Visit (INDEPENDENT_AMBULATORY_CARE_PROVIDER_SITE_OTHER): Payer: Medicare Other | Admitting: Family Medicine

## 2020-07-09 VITALS — BP 118/72 | Temp 96.8°F | Wt 193.0 lb

## 2020-07-09 DIAGNOSIS — I10 Essential (primary) hypertension: Secondary | ICD-10-CM | POA: Diagnosis not present

## 2020-07-09 DIAGNOSIS — I25118 Atherosclerotic heart disease of native coronary artery with other forms of angina pectoris: Secondary | ICD-10-CM | POA: Insufficient documentation

## 2020-07-09 DIAGNOSIS — M1000 Idiopathic gout, unspecified site: Secondary | ICD-10-CM

## 2020-07-09 DIAGNOSIS — E1169 Type 2 diabetes mellitus with other specified complication: Secondary | ICD-10-CM

## 2020-07-09 DIAGNOSIS — E7849 Other hyperlipidemia: Secondary | ICD-10-CM

## 2020-07-09 DIAGNOSIS — E785 Hyperlipidemia, unspecified: Secondary | ICD-10-CM

## 2020-07-09 MED ORDER — METFORMIN HCL 500 MG PO TABS: ORAL_TABLET | ORAL | 1 refills | Status: DC

## 2020-07-09 MED ORDER — ALLOPURINOL 100 MG PO TABS: ORAL_TABLET | ORAL | 5 refills | Status: DC

## 2020-07-09 MED ORDER — SIMVASTATIN 40 MG PO TABS: ORAL_TABLET | ORAL | 1 refills | Status: DC

## 2020-07-09 NOTE — Progress Notes (Signed)
   Subjective:    Patient ID: Anthony Lucas, male    DOB: 09-18-1930, 87 y.o.   MRN: [id number]  Hypertension This is a chronic problem. Risk factors for coronary artery disease include diabetes mellitus and dyslipidemia. There are no compliance problems.   Pt states his blood pressure has been doing well. Pt still having issues with not being able to empty bladder fully sometimes but has been prescribed med.  Essential hypertension - Plan: Comprehensive metabolic panel  Hyperlipidemia associated with type 2 diabetes mellitus (HCC)  Idiopathic gout, unspecified chronicity, unspecified site - Plan: Uric acid  Other hyperlipidemia - Plan: Lipid panel  Controlled type 2 diabetes mellitus with other specified complication, without long-term current use of insulin (HCC) - Plan: Hemoglobin A1c  Coronary artery disease of native artery of native heart with stable angina pectoris [name])  Patient denies any chest tightness pain or pressure currently he is being followed for heart failure with cardiology   Review of Systems     Objective:   Physical Exam Vitals reviewed.  Constitutional:      General: He is not in acute distress. HENT:     Head: Normocephalic and atraumatic.  Eyes:     General:        Right eye: No discharge.        Left eye: No discharge.  Neck:     Trachea: No tracheal deviation.  Cardiovascular:     Rate and Rhythm: Normal rate.     Heart sounds: Normal heart sounds. No murmur heard.   Pulmonary:     Effort: Pulmonary effort is normal. No respiratory distress.     Breath sounds: Normal breath sounds.  Lymphadenopathy:     Cervical: No cervical adenopathy.  Skin:    General: Skin is warm and dry.  Neurological:     Mental Status: He is alert.     Coordination: Coordination normal.  Psychiatric:        Behavior: Behavior normal.           Assessment & Plan:  1. Essential hypertension Blood pressure decent control continue current measures check  labs we will share these with cardiology - Comprehensive metabolic panel  2. Hyperlipidemia associated with type 2 diabetes mellitus ([name]) Cholesterol will be ordered.  Healthy diet recommended.  3. Idiopathic gout, unspecified chronicity, unspecified site Healthy diet recommended check uric acid continue allopurinol await creatinine - Uric acid  4. Other hyperlipidemia Cholesterol check as stated above - Lipid panel  5. Controlled type 2 diabetes mellitus with other specified complication, without long-term current use of insulin (HCC) Continue current medication watch diet check creatinine check A1c - Hemoglobin A1c  6. Coronary artery disease of native artery of native heart with stable angina pectoris [name] Follow through with cardiology on a regular basis Recheck here in 4 to 6 months

## 2020-07-10 ENCOUNTER — Encounter: Payer: Self-pay | Admitting: Family Medicine

## 2020-07-10 LAB — COMPREHENSIVE METABOLIC PANEL
ALT: 16 IU/L (ref 0–44)
AST: 17 IU/L (ref 0–40)
Albumin/Globulin Ratio: 1.4 (ref 1.2–2.2)
Albumin: 4.2 g/dL (ref 3.6–4.6)
Alkaline Phosphatase: 65 IU/L (ref 44–121)
BUN/Creatinine Ratio: 19 (ref 10–24)
BUN: 21 mg/dL (ref 8–27)
Bilirubin Total: 0.4 mg/dL (ref 0.0–1.2)
CO2: 25 mmol/L (ref 20–29)
Calcium: 9.2 mg/dL (ref 8.6–10.2)
Chloride: 101 mmol/L (ref 96–106)
Creatinine, Ser: 1.08 mg/dL (ref 0.76–1.27)
Globulin, Total: 3.1 g/dL (ref 1.5–4.5)
Glucose: 92 mg/dL (ref 65–99)
Potassium: 4.6 mmol/L (ref 3.5–5.2)
Sodium: 142 mmol/L (ref 134–144)
Total Protein: 7.3 g/dL (ref 6.0–8.5)
eGFR: 66 mL/min/{1.73_m2} (ref >59)

## 2020-07-10 LAB — HEMOGLOBIN A1C
Est. average glucose Bld gHb Est-mCnc: 160 mg/dL
Hgb A1c MFr Bld: 7.2 % — ABNORMAL HIGH (ref 4.8–5.6)

## 2020-07-10 LAB — LIPID PANEL
Chol/HDL Ratio: 3.1 ratio (ref 0.0–5.0)
Cholesterol, Total: 147 mg/dL (ref 100–199)
HDL: 48 mg/dL (ref >39)
LDL Chol Calc (NIH): 69 mg/dL (ref 0–99)
Triglycerides: 177 mg/dL — ABNORMAL HIGH (ref 0–149)
VLDL Cholesterol Cal: 30 mg/dL (ref 5–40)

## 2020-07-10 LAB — URIC ACID: Uric Acid: 7.4 mg/dL (ref 3.8–8.4)

## 2020-07-11 ENCOUNTER — Ambulatory Visit: Payer: Medicare Other | Admitting: Urology

## 2020-07-11 ENCOUNTER — Telehealth: Payer: Self-pay | Admitting: Internal Medicine

## 2020-07-11 NOTE — Telephone Encounter (Signed)
Patient's daughter states the battery was low the last time he was seen and the patient was told he may need a procedure in April- June to have it replaced. She states he is concerned, because it is April and he hasn't heard anything.

## 2020-07-11 NOTE — Telephone Encounter (Signed)
Spoke to patients daughter Malachy Mood [facility]). Advised that on 06/17/20 patient had 2 months left on battery. Next remote is scheduled on 07/22/20. Advised we will check the battery every month and once ERI is reached the device has 3 months left of battery to allow time for a gen change to be completed. Advised to call abck with further questions or concerns. Thanked me for call.

## 2020-07-15 ENCOUNTER — Other Ambulatory Visit: Payer: Self-pay | Admitting: Cardiology

## 2020-07-15 DIAGNOSIS — I5022 Chronic systolic (congestive) heart failure: Secondary | ICD-10-CM

## 2020-07-19 DIAGNOSIS — B351 Tinea unguium: Secondary | ICD-10-CM | POA: Diagnosis not present

## 2020-07-19 DIAGNOSIS — E1142 Type 2 diabetes mellitus with diabetic polyneuropathy: Secondary | ICD-10-CM | POA: Diagnosis not present

## 2020-07-22 ENCOUNTER — Ambulatory Visit (INDEPENDENT_AMBULATORY_CARE_PROVIDER_SITE_OTHER): Payer: Medicare Other

## 2020-07-22 DIAGNOSIS — I5022 Chronic systolic (congestive) heart failure: Secondary | ICD-10-CM

## 2020-07-22 DIAGNOSIS — I43 Cardiomyopathy in diseases classified elsewhere: Secondary | ICD-10-CM

## 2020-07-23 LAB — CUP PACEART REMOTE DEVICE CHECK
Battery Remaining Longevity: 1 mo
Battery Voltage: 2.61 V
Brady Statistic AP VP Percent: 0 %
Brady Statistic AP VS Percent: 0 %
Brady Statistic AS VP Percent: 98.55 %
Brady Statistic AS VS Percent: 1.45 %
Brady Statistic RA Percent Paced: 0 %
Brady Statistic RV Percent Paced: 98.54 %
Date Time Interrogation Session: 20220418013019
Implantable Lead Implant Date: 20170914
Implantable Lead Implant Date: 20170914
Implantable Lead Implant Date: 20170914
Implantable Lead Location: 753858
Implantable Lead Location: 753859
Implantable Lead Location: 753860
Implantable Lead Model: 4598
Implantable Lead Model: 5076
Implantable Lead Model: 5076
Implantable Pulse Generator Implant Date: 20170914
Lead Channel Impedance Value: 1026 Ohm
Lead Channel Impedance Value: 323 Ohm
Lead Channel Impedance Value: 418 Ohm
Lead Channel Impedance Value: 418 Ohm
Lead Channel Impedance Value: 437 Ohm
Lead Channel Impedance Value: 494 Ohm
Lead Channel Impedance Value: 513 Ohm
Lead Channel Impedance Value: 532 Ohm
Lead Channel Impedance Value: 627 Ohm
Lead Channel Impedance Value: 627 Ohm
Lead Channel Impedance Value: 760 Ohm
Lead Channel Impedance Value: 817 Ohm
Lead Channel Impedance Value: 950 Ohm
Lead Channel Impedance Value: 969 Ohm
Lead Channel Pacing Threshold Amplitude: 0.625 V
Lead Channel Pacing Threshold Amplitude: 0.625 V
Lead Channel Pacing Threshold Amplitude: 4.5 V
Lead Channel Pacing Threshold Pulse Width: 0.4 ms
Lead Channel Pacing Threshold Pulse Width: 0.4 ms
Lead Channel Pacing Threshold Pulse Width: 1.5 ms
Lead Channel Sensing Intrinsic Amplitude: 0.75 mV
Lead Channel Sensing Intrinsic Amplitude: 2.375 mV
Lead Channel Sensing Intrinsic Amplitude: 8 mV
Lead Channel Sensing Intrinsic Amplitude: 8 mV
Lead Channel Setting Pacing Amplitude: 2.5 V
Lead Channel Setting Pacing Amplitude: 5 V
Lead Channel Setting Pacing Pulse Width: 0.4 ms
Lead Channel Setting Pacing Pulse Width: 1.5 ms
Lead Channel Setting Sensing Sensitivity: 2.8 mV

## 2020-07-29 ENCOUNTER — Other Ambulatory Visit: Payer: Self-pay

## 2020-07-29 ENCOUNTER — Ambulatory Visit (INDEPENDENT_AMBULATORY_CARE_PROVIDER_SITE_OTHER): Payer: Medicare Other | Admitting: Urology

## 2020-07-29 VITALS — BP 115/66 | HR 87 | Temp 97.7°F | Ht 73.0 in | Wt 185.0 lb

## 2020-07-29 DIAGNOSIS — R339 Retention of urine, unspecified: Secondary | ICD-10-CM | POA: Diagnosis not present

## 2020-07-29 DIAGNOSIS — I25118 Atherosclerotic heart disease of native coronary artery with other forms of angina pectoris: Secondary | ICD-10-CM | POA: Diagnosis not present

## 2020-07-29 DIAGNOSIS — N3021 Other chronic cystitis with hematuria: Secondary | ICD-10-CM | POA: Diagnosis not present

## 2020-07-29 LAB — BLADDER SCAN AMB NON-IMAGING: Scan Result: 517

## 2020-07-29 LAB — MICROSCOPIC EXAMINATION
Renal Epithel, UA: NONE SEEN /hpf
WBC, UA: >30 /hpf — AB (ref 0–5)

## 2020-07-29 LAB — URINALYSIS, ROUTINE W REFLEX MICROSCOPIC
Bilirubin, UA: NEGATIVE
Glucose, UA: NEGATIVE
Ketones, UA: NEGATIVE
Nitrite, UA: POSITIVE — AB
Protein,UA: NEGATIVE
Specific Gravity, UA: 1.01 (ref 1.005–1.030)
Urobilinogen, Ur: 0.2 mg/dL (ref 0.2–1.0)
pH, UA: 5.5 (ref 5.0–7.5)

## 2020-07-29 MED ORDER — SILODOSIN 8 MG PO CAPS: 8.0000 mg | ORAL_CAPSULE | Freq: Two times a day (BID) | ORAL | 11 refills | Status: DC

## 2020-07-29 MED ORDER — FINASTERIDE 5 MG PO TABS: ORAL_TABLET | ORAL | 3 refills | Status: DC

## 2020-07-29 NOTE — Progress Notes (Signed)
post void residual= 517  Urological Symptom Review  Patient is experiencing the following symptoms: Get up at night to urinate Leakage of urine Urine stream start and stop at the end  Review of Systems  Gastrointestinal (upper)  : Negative for upper GI symptoms  Gastrointestinal (lower) : Negative for lower GI symptoms  Constitutional : Negative for symptoms  Skin: Negative for skin symptoms  Eyes: Negative for eye symptoms  Ear/Nose/Throat : Negative for Ear/Nose/Throat symptoms  Hematologic/Lymphatic: Negative for Hematologic/Lymphatic symptoms  Cardiovascular : Negative for cardiovascular symptoms  Respiratory : Negative for respiratory symptoms  Endocrine: Negative for endocrine symptoms  Musculoskeletal: Negative for musculoskeletal symptoms  Neurological: Negative for neurological symptoms  Psychologic: Negative for psychiatric symptoms

## 2020-07-29 NOTE — Progress Notes (Signed)
07/29/2020 2:41 PM   Anthony Lucas 09/12/1930 [id number]  Referring provider: Kathyrn Drown, MD [address] [place],  [place] [address]  Followup BPH and UTIs  HPI: Mr Anthony Lucas is a 87yo here for followup for BPH and recurrent UTI. He has had no UTIs since last visit. He has stable LUTS on rapaflo 74m BID and finasteride IPSS 12 QOL 3. Nocturia 2-3x based on fluid consumption. Stream strong. No dysuria   PMH: Past Medical History:  Diagnosis Date  . Arthritis    "right knee" (12/19/2015)  . CHF (congestive heart failure) ([facility]    Abnormal echo January 2017  . History of colon polyps   . History of gout   . Hyperlipidemia   . Hypertension   . Myocardial infarction ([facility]    "they said I'd had a heart attack but didn't know it" (12/19/2015)  . Presence of permanent cardiac pacemaker   . Seasonal allergies   . Skin cancer    "arms/hands" (12/19/2015)  . Sleep apnea    supposed to wear CPAP, but doesn't (12/19/2015)  . Type II diabetes mellitus ([facility]     Surgical History: Past Surgical History:  Procedure Laterality Date  . CATARACT EXTRACTION W/PHACO Right 09/08/2012   Procedure: CATARACT EXTRACTION PHACO AND INTRAOCULAR LENS PLACEMENT (IOC);  Surgeon: KTonny Branch MD;  Location: [facility];  Service: Ophthalmology;  Laterality: Right;  CDE: 21.28  . CATARACT EXTRACTION W/PHACO Left 12/08/2012   Procedure: CATARACT EXTRACTION PHACO AND INTRAOCULAR LENS PLACEMENT (IOC);  Surgeon: KTonny Branch MD;  Location: [facility];  Service: Ophthalmology;  Laterality: Left;  CDE:25.72  . [place] . COLONOSCOPY     4 procedures  . COLONOSCOPY  2012   Dr. PSharlett Iles diverticulosis, lipoma in ascending colon.   .Consuela MimesWITH FULGERATION N/A 08/11/2017   Procedure: CIselinOF PROSTATIC VARICES;  Surgeon: MCleon Gustin MD;  Location: [facility];  Service: Urology;  Laterality: N/A;  . EP IMPLANTABLE DEVICE N/A 12/19/2015   Procedure: BiV Pacemaker  Insertion CRT-P;  Surgeon: GEvans Lance MD;  [place] [place];  Service: Cardiovascular;  Laterality: N/A;  . INSERT / REPLACE / REMOVE PACEMAKER  12/19/2015  . JOINT REPLACEMENT Left    knee  . POLYPECTOMY    . TONSILLECTOMY    . TOTAL KNEE ARTHROPLASTY Left 2000s  . TRANSURETHRAL RESECTION OF PROSTATE    . YAG LASER APPLICATION Right 36/44/0347  Procedure: YAG LASER APPLICATION;  Surgeon: MRutherford Guys MD;  Location: [facility];  Service: Ophthalmology;  Laterality: Right;  . YAG LASER APPLICATION Left 34/25/9563  Procedure: YAG LASER APPLICATION;  Surgeon: MRutherford Guys MD;  Location: [facility];  Service: Ophthalmology;  Laterality: Left;    Home Medications:  Allergies as of 07/29/2020      Reactions   Bee Venom Anaphylaxis   Honey bee       Medication List       Accurate as of July 29, 2020  2:41 PM. If you have any questions, ask your nurse or doctor.        allopurinol 100 MG tablet Commonly known as: ZYLOPRIM 2 qd for gout prevention   blood glucose meter kit and supplies Dispense based on patient and insurance preference. Use to check blood sugar once daily as directed. For E11.9.   clobetasol cream 0.05 % Commonly known as: TEMOVATE APPLY LIGHTLY TO AFFECTEDUSKIN TWICE DAILY FOR 14EDAYS   Entresto 24-26  MG Generic drug: sacubitril-valsartan TAKE ONE TABLET BY MOUTH 2 TIMES A DAY.   finasteride 5 MG tablet Commonly known as: PROSCAR TAKE ONE TABLET BY MOUTH ONCE DAILY   metFORMIN 500 MG tablet Commonly known as: GLUCOPHAGE TAKE ONE TABLET BY MOUTH DAILY. PLEASE CONTACT OFFICE FOR SUMMER APPOINTMENT   metoprolol succinate 25 MG 24 hr tablet Commonly known as: TOPROL-XL TAKE 1 TABLET BY MOUTH TWICE DAILY.   potassium chloride 10 MEQ tablet Commonly known as: KLOR-CON Take 1 tablet (10 mEq total) by mouth daily.   silodosin 8 MG Caps capsule Commonly known as: RAPAFLO Take 1 capsule (8 mg total) by mouth in the morning and at bedtime.    simvastatin 40 MG tablet Commonly known as: ZOCOR TAKE ONE TABLET BY MOUTH EVERY DAY   torsemide 20 MG tablet Commonly known as: DEMADEX TAKE 1 TABLET BY MOUTH TWICE A DAY.       Allergies:  Allergies  Allergen Reactions  . Bee Venom Anaphylaxis    Honey bee     Family History: Family History  Problem Relation Age of Onset  . Lung cancer Father   . Colon cancer Neg Hx     Social History:  reports that he has never smoked. He has never used smokeless tobacco. He reports previous alcohol use of about 2.0 standard drinks of alcohol per week. He reports that he does not use drugs.  ROS: All other review of systems were reviewed and are negative except what is noted above in HPI  Physical Exam: BP 115/66   Pulse 87   Temp 97.7 F (36.5 C)   Ht _0  (1.854 m)   Wt 185 lb (83.9 kg)   BMI 24.41 kg/m   Constitutional:  Alert and oriented, No acute distress. HEENT:  AT, moist mucus membranes.  Trachea midline, no masses. Cardiovascular: No clubbing, cyanosis, or edema. Respiratory: Normal respiratory effort, no increased work of breathing. GI: Abdomen is soft, nontender, nondistended, no abdominal masses GU: No CVA tenderness.  Lymph: No cervical or inguinal lymphadenopathy. Skin: No rashes, bruises or suspicious lesions. Neurologic: Grossly intact, no focal deficits, moving all 4 extremities. Psychiatric: Normal mood and affect.  Laboratory Data: Lab Results  Component Value Date   WBC 7.7 10/16/2019   HGB 13.2 10/16/2019   HCT 41.3 10/16/2019   MCV 86 10/16/2019   PLT 165 10/16/2019    Lab Results  Component Value Date   CREATININE 1.08 07/09/2020    No results found for: PSA  No results found for: TESTOSTERONE  Lab Results  Component Value Date   HGBA1C 7.2 (H) 07/09/2020    Urinalysis    Component Value Date/Time   APPEARANCEUR Cloudy (A) 07/29/2020 1410   GLUCOSEU Negative 07/29/2020 1410   BILIRUBINUR Negative 07/29/2020 1410    PROTEINUR Negative 07/29/2020 1410   UROBILINOGEN 0.2 06/27/2019 1403   NITRITE Positive (A) 07/29/2020 1410   LEUKOCYTESUR 2+ (A) 07/29/2020 1410    Lab Results  Component Value Date   LABMICR See below: 07/29/2020   WBCUA >30 (A) 07/29/2020   LABEPIT 0-10 07/29/2020   BACTERIA Many (A) 07/29/2020    Pertinent Imaging:  No results found for this or any previous visit.  No results found for this or any previous visit.  No results found for this or any previous visit.  No results found for this or any previous visit.  No results found for this or any previous visit.  No results found for this or any  previous visit.  No results found for this or any previous visit.  No results found for this or any previous visit.   Assessment & Plan:    1. Incomplete emptying of bladder -continue finasteride 28m and rapaflo 828mBID - Urinalysis, Routine w reflex microscopic - BLADDER SCAN AMB NON-IMAGING - silodosin (RAPAFLO) 8 MG CAPS capsule; Take 1 capsule (8 mg total) by mouth in the morning and at bedtime.  Dispense: 60 capsule; Refill: 11  2. Chronic cystitis with hematuria -no UTIs since last visit.    No follow-ups on file.  PaNicolette BangMD  [facility] [place]

## 2020-08-02 ENCOUNTER — Telehealth: Payer: Self-pay

## 2020-08-02 DIAGNOSIS — N390 Urinary tract infection, site not specified: Secondary | ICD-10-CM

## 2020-08-02 LAB — URINE CULTURE

## 2020-08-02 MED ORDER — NITROFURANTOIN MONOHYD MACRO 100 MG PO CAPS: 100.0000 mg | ORAL_CAPSULE | Freq: Two times a day (BID) | ORAL | 0 refills | Status: DC

## 2020-08-02 MED ORDER — NITROFURANTOIN MACROCRYSTAL 50 MG PO CAPS: 50.0000 mg | ORAL_CAPSULE | Freq: Every evening | ORAL | 11 refills | Status: DC

## 2020-08-02 NOTE — Telephone Encounter (Signed)
Rx sent. Patient called and made aware. 

## 2020-08-02 NOTE — Telephone Encounter (Signed)
-----   Message from Cleon Gustin, MD sent at 08/02/2020 10:50 AM EDT ----- Macrobid 100mg  BID for 7 days then 50mg  qhs. ----- Message ----- From: Iris Pert, LPN Sent: 3/36/1224  10:45 AM EDT To: Cleon Gustin, MD  Please review

## 2020-08-07 NOTE — Progress Notes (Signed)
Remote pacemaker transmission.   

## 2020-08-07 NOTE — Addendum Note (Signed)
Addended by: Cheri Kearns A on: 08/07/2020 11:13 AM   Modules accepted: Level of Service

## 2020-08-12 ENCOUNTER — Other Ambulatory Visit: Payer: Self-pay | Admitting: Cardiology

## 2020-08-13 ENCOUNTER — Encounter: Payer: Self-pay | Admitting: Urology

## 2020-08-13 NOTE — Patient Instructions (Signed)

## 2020-09-17 ENCOUNTER — Telehealth: Payer: Self-pay

## 2020-09-17 NOTE — Telephone Encounter (Signed)
Patient informed due to his urinary retention he can not take Myrbetriq. Patient made aware that he has an active rx for Rapaflo and he will need to call pharmacy and request a refill. Patient voiced understanding.

## 2020-09-17 NOTE — Telephone Encounter (Signed)
  Patients brother takes Myrbepriq for leakage.  Patient wants to know if he can start taking this as well to help him since he has leakage.  Needing a refill on:  silodosin (RAPAFLO) 8 MG CAPS capsule He wants to know if needs to keep taking this.   Fill at: [facility], [place] Phone:  [phone]  Fax:  [phone]      Please advise.  Wants a call back today if possible.  Call back:  [phone] (H)   Thanks, Helene Kelp

## 2020-09-27 ENCOUNTER — Ambulatory Visit (INDEPENDENT_AMBULATORY_CARE_PROVIDER_SITE_OTHER): Payer: Medicare Other

## 2020-09-27 DIAGNOSIS — I5022 Chronic systolic (congestive) heart failure: Secondary | ICD-10-CM

## 2020-09-27 LAB — CUP PACEART REMOTE DEVICE CHECK
Battery Remaining Longevity: 1 mo — CL
Battery Voltage: 2.6 V
Brady Statistic AP VP Percent: 0 %
Brady Statistic AP VS Percent: 0 %
Brady Statistic AS VP Percent: 97.89 %
Brady Statistic AS VS Percent: 2.11 %
Brady Statistic RA Percent Paced: 0 %
Brady Statistic RV Percent Paced: 97.88 %
Date Time Interrogation Session: 20220624014255
Implantable Lead Implant Date: 20170914
Implantable Lead Implant Date: 20170914
Implantable Lead Implant Date: 20170914
Implantable Lead Location: 753858
Implantable Lead Location: 753859
Implantable Lead Location: 753860
Implantable Lead Model: 4598
Implantable Lead Model: 5076
Implantable Lead Model: 5076
Implantable Pulse Generator Implant Date: 20170914
Lead Channel Impedance Value: 1007 Ohm
Lead Channel Impedance Value: 1064 Ohm
Lead Channel Impedance Value: 323 Ohm
Lead Channel Impedance Value: 437 Ohm
Lead Channel Impedance Value: 475 Ohm
Lead Channel Impedance Value: 494 Ohm
Lead Channel Impedance Value: 513 Ohm
Lead Channel Impedance Value: 532 Ohm
Lead Channel Impedance Value: 551 Ohm
Lead Channel Impedance Value: 646 Ohm
Lead Channel Impedance Value: 646 Ohm
Lead Channel Impedance Value: 836 Ohm
Lead Channel Impedance Value: 893 Ohm
Lead Channel Impedance Value: 988 Ohm
Lead Channel Pacing Threshold Amplitude: 0.5 V
Lead Channel Pacing Threshold Amplitude: 0.625 V
Lead Channel Pacing Threshold Amplitude: 4.75 V
Lead Channel Pacing Threshold Pulse Width: 0.4 ms
Lead Channel Pacing Threshold Pulse Width: 0.4 ms
Lead Channel Pacing Threshold Pulse Width: 1.5 ms
Lead Channel Sensing Intrinsic Amplitude: 0.75 mV
Lead Channel Sensing Intrinsic Amplitude: 2.375 mV
Lead Channel Sensing Intrinsic Amplitude: 8.125 mV
Lead Channel Sensing Intrinsic Amplitude: 8.125 mV
Lead Channel Setting Pacing Amplitude: 2.5 V
Lead Channel Setting Pacing Amplitude: 5 V
Lead Channel Setting Pacing Pulse Width: 0.4 ms
Lead Channel Setting Pacing Pulse Width: 1.5 ms
Lead Channel Setting Sensing Sensitivity: 2.8 mV

## 2020-10-10 ENCOUNTER — Other Ambulatory Visit: Payer: Self-pay | Admitting: Family Medicine

## 2020-10-11 NOTE — Progress Notes (Signed)
Remote pacemaker transmission.   

## 2020-10-19 ENCOUNTER — Other Ambulatory Visit: Payer: Self-pay | Admitting: Family Medicine

## 2020-10-25 ENCOUNTER — Telehealth: Payer: Self-pay

## 2020-10-25 DIAGNOSIS — Z950 Presence of cardiac pacemaker: Secondary | ICD-10-CM

## 2020-10-25 DIAGNOSIS — I5022 Chronic systolic (congestive) heart failure: Secondary | ICD-10-CM

## 2020-10-25 NOTE — Telephone Encounter (Signed)
Carelink alert received- PPM has reached ERI.  Device was previously programmed [id number], now running VVI 65.    Pt had not noticed that ERI was met.   LOV 05/07/20.  Replacement discussed. "BIV PPM - his device is approaching ERI. I discussed the treatment options and would recommend left bundle area pacing though I discussed epicardial lead placement as well as a straight gen change out. We will plan for left bundle area pacing if his LSCV is patent."   Advised pt I would forward to Dr. Tanna Furry nurse to contact him for scheduling procedure.

## 2020-10-28 ENCOUNTER — Ambulatory Visit (INDEPENDENT_AMBULATORY_CARE_PROVIDER_SITE_OTHER): Payer: Medicare Other

## 2020-10-28 DIAGNOSIS — I442 Atrioventricular block, complete: Secondary | ICD-10-CM

## 2020-10-28 DIAGNOSIS — I43 Cardiomyopathy in diseases classified elsewhere: Secondary | ICD-10-CM

## 2020-10-28 NOTE — Telephone Encounter (Signed)
Need to discuss potential procedure prior to scheduling.  Daughter aware will call her tomorrow to schedule Pt.

## 2020-10-28 NOTE — Telephone Encounter (Signed)
Sonia Baller, lets get him in to be seen so we can schedule him a lead revision.

## 2020-10-30 LAB — CUP PACEART REMOTE DEVICE CHECK
Battery Remaining Longevity: 1 mo — CL
Battery Voltage: 2.59 V
Brady Statistic AP VP Percent: 0 %
Brady Statistic AP VS Percent: 0 %
Brady Statistic AS VP Percent: 99.64 %
Brady Statistic AS VS Percent: 0.36 %
Brady Statistic RA Percent Paced: 0 %
Brady Statistic RV Percent Paced: 99.64 %
Date Time Interrogation Session: 20220725014345
Implantable Lead Implant Date: 20170914
Implantable Lead Implant Date: 20170914
Implantable Lead Implant Date: 20170914
Implantable Lead Location: 753858
Implantable Lead Location: 753859
Implantable Lead Location: 753860
Implantable Lead Model: 4598
Implantable Lead Model: 5076
Implantable Lead Model: 5076
Implantable Pulse Generator Implant Date: 20170914
Lead Channel Impedance Value: 323 Ohm
Lead Channel Impedance Value: 399 Ohm
Lead Channel Impedance Value: 418 Ohm
Lead Channel Impedance Value: 418 Ohm
Lead Channel Impedance Value: 475 Ohm
Lead Channel Impedance Value: 494 Ohm
Lead Channel Impedance Value: 532 Ohm
Lead Channel Impedance Value: 589 Ohm
Lead Channel Impedance Value: 608 Ohm
Lead Channel Impedance Value: 703 Ohm
Lead Channel Impedance Value: 760 Ohm
Lead Channel Impedance Value: 912 Ohm
Lead Channel Impedance Value: 912 Ohm
Lead Channel Impedance Value: 969 Ohm
Lead Channel Pacing Threshold Amplitude: 0.5 V
Lead Channel Pacing Threshold Amplitude: 0.625 V
Lead Channel Pacing Threshold Amplitude: 4.75 V
Lead Channel Pacing Threshold Pulse Width: 0.4 ms
Lead Channel Pacing Threshold Pulse Width: 0.4 ms
Lead Channel Pacing Threshold Pulse Width: 1.5 ms
Lead Channel Sensing Intrinsic Amplitude: 0.75 mV
Lead Channel Sensing Intrinsic Amplitude: 2.375 mV
Lead Channel Sensing Intrinsic Amplitude: 8.375 mV
Lead Channel Sensing Intrinsic Amplitude: 8.375 mV
Lead Channel Setting Pacing Amplitude: 2.5 V
Lead Channel Setting Pacing Amplitude: 5 V
Lead Channel Setting Pacing Pulse Width: 0.4 ms
Lead Channel Setting Pacing Pulse Width: 1.5 ms
Lead Channel Setting Sensing Sensitivity: 2.8 mV

## 2020-10-30 NOTE — Addendum Note (Signed)
Addended by: Willeen Cass A on: 10/30/2020 02:04 PM   Modules accepted: Orders

## 2020-10-30 NOTE — Telephone Encounter (Signed)
Outreach made to Pt's daughter.  Discussed potential tx plan per Dr. Lovena Le-  will obtain venogram at gen change procedure.  If vein open will attempt to place a left bundle area pacing lead with new generator.  If vein is not open, will proceed with generator change along.    Daughter indicates understanding and in agreement with plan.  Will get lab work November 12, 2020 and then go to [employer] to pick up surgical scrub and instructions.  Pt scheduled for gen change and possible lead placement on November 14, 2020 at 1:30 pm-arrival 11:30 am.  Instruction letter sent via mychart.  Work up complete.

## 2020-11-12 ENCOUNTER — Other Ambulatory Visit (HOSPITAL_COMMUNITY)
Admission: RE | Admit: 2020-11-12 | Discharge: 2020-11-12 | Disposition: A | Discharge status: Home / Self Care | Payer: Medicare Other | Source: Ambulatory Visit | Attending: Internal Medicine | Admitting: Internal Medicine

## 2020-11-12 ENCOUNTER — Other Ambulatory Visit: Payer: Self-pay

## 2020-11-12 DIAGNOSIS — Z950 Presence of cardiac pacemaker: Secondary | ICD-10-CM | POA: Insufficient documentation

## 2020-11-12 DIAGNOSIS — I5022 Chronic systolic (congestive) heart failure: Secondary | ICD-10-CM | POA: Diagnosis not present

## 2020-11-12 LAB — CBC WITH DIFFERENTIAL/PLATELET
Abs Immature Granulocytes: 0.01 10*3/uL (ref 0.00–0.07)
Basophils Absolute: 0.1 10*3/uL (ref 0.0–0.1)
Basophils Relative: 1 %
Eosinophils Absolute: 0.2 10*3/uL (ref 0.0–0.5)
Eosinophils Relative: 3 %
HCT: 39.6 % (ref 39.0–52.0)
Hemoglobin: 12.3 g/dL — ABNORMAL LOW (ref 13.0–17.0)
Immature Granulocytes: 0 %
Lymphocytes Relative: 12 %
Lymphs Abs: 0.8 10*3/uL (ref 0.7–4.0)
MCH: 28 pg (ref 26.0–34.0)
MCHC: 31.1 g/dL (ref 30.0–36.0)
MCV: 90.2 fL (ref 80.0–100.0)
Monocytes Absolute: 0.6 10*3/uL (ref 0.1–1.0)
Monocytes Relative: 8 %
Neutro Abs: 5.1 10*3/uL (ref 1.7–7.7)
Neutrophils Relative %: 76 %
Platelets: 171 10*3/uL (ref 150–400)
RBC: 4.39 MIL/uL (ref 4.22–5.81)
RDW: 14.4 % (ref 11.5–15.5)
WBC: 6.8 10*3/uL (ref 4.0–10.5)
nRBC: 0 % (ref 0.0–0.2)

## 2020-11-12 LAB — BASIC METABOLIC PANEL
Anion gap: 7 (ref 5–15)
BUN: 23 mg/dL (ref 8–23)
CO2: 27 mmol/L (ref 22–32)
Calcium: 8.6 mg/dL — ABNORMAL LOW (ref 8.9–10.3)
Chloride: 106 mmol/L (ref 98–111)
Creatinine, Ser: 1.23 mg/dL (ref 0.61–1.24)
GFR, Estimated: 57 mL/min — ABNORMAL LOW (ref >60)
Glucose, Bld: 127 mg/dL — ABNORMAL HIGH (ref 70–99)
Potassium: 4.1 mmol/L (ref 3.5–5.1)
Sodium: 140 mmol/L (ref 135–145)

## 2020-11-14 ENCOUNTER — Other Ambulatory Visit: Payer: Self-pay

## 2020-11-14 ENCOUNTER — Ambulatory Visit (HOSPITAL_COMMUNITY): Payer: Medicare Other

## 2020-11-14 ENCOUNTER — Ambulatory Visit (HOSPITAL_COMMUNITY)
Admission: RE | Admit: 2020-11-14 | Discharge: 2020-11-14 | Disposition: A | Discharge status: Home / Self Care | Payer: Medicare Other | Source: Ambulatory Visit | Attending: Internal Medicine | Admitting: Internal Medicine

## 2020-11-14 ENCOUNTER — Ambulatory Visit (HOSPITAL_COMMUNITY)
Admission: RE | Disposition: A | Discharge status: Home / Self Care | Payer: Self-pay | Source: Ambulatory Visit | Attending: Internal Medicine

## 2020-11-14 DIAGNOSIS — I442 Atrioventricular block, complete: Secondary | ICD-10-CM | POA: Insufficient documentation

## 2020-11-14 DIAGNOSIS — J984 Other disorders of lung: Secondary | ICD-10-CM | POA: Diagnosis not present

## 2020-11-14 DIAGNOSIS — I5022 Chronic systolic (congestive) heart failure: Secondary | ICD-10-CM | POA: Insufficient documentation

## 2020-11-14 DIAGNOSIS — Z79899 Other long term (current) drug therapy: Secondary | ICD-10-CM | POA: Insufficient documentation

## 2020-11-14 DIAGNOSIS — Z91030 Bee allergy status: Secondary | ICD-10-CM | POA: Diagnosis not present

## 2020-11-14 DIAGNOSIS — Z45010 Encounter for checking and testing of cardiac pacemaker pulse generator [battery]: Secondary | ICD-10-CM | POA: Diagnosis not present

## 2020-11-14 DIAGNOSIS — Z7984 Long term (current) use of oral hypoglycemic drugs: Secondary | ICD-10-CM | POA: Diagnosis not present

## 2020-11-14 DIAGNOSIS — R918 Other nonspecific abnormal finding of lung field: Secondary | ICD-10-CM | POA: Diagnosis not present

## 2020-11-14 DIAGNOSIS — I4891 Unspecified atrial fibrillation: Secondary | ICD-10-CM | POA: Diagnosis not present

## 2020-11-14 DIAGNOSIS — I252 Old myocardial infarction: Secondary | ICD-10-CM | POA: Diagnosis not present

## 2020-11-14 DIAGNOSIS — I700 Atherosclerosis of aorta: Secondary | ICD-10-CM | POA: Diagnosis not present

## 2020-11-14 DIAGNOSIS — Z950 Presence of cardiac pacemaker: Secondary | ICD-10-CM

## 2020-11-14 DIAGNOSIS — I110 Hypertensive heart disease with heart failure: Secondary | ICD-10-CM | POA: Diagnosis not present

## 2020-11-14 HISTORY — PX: LEAD REVISION/REPAIR: EP1213

## 2020-11-14 HISTORY — PX: BIV PACEMAKER GENERATOR CHANGEOUT: EP1198

## 2020-11-14 LAB — GLUCOSE, CAPILLARY
Glucose-Capillary: 118 mg/dL — ABNORMAL HIGH (ref 70–99)
Glucose-Capillary: 106 mg/dL — ABNORMAL HIGH (ref 70–99)
Glucose-Capillary: 113 mg/dL — ABNORMAL HIGH (ref 70–99)

## 2020-11-14 SURGERY — BIV PACEMAKER GENERATOR CHANGEOUT

## 2020-11-14 MED ORDER — CEFAZOLIN SODIUM-DEXTROSE 2-4 GM/100ML-% IV SOLN
2.0000 g | INTRAVENOUS | Status: AC
  Administered 2020-11-14: 2 g via INTRAVENOUS

## 2020-11-14 MED ORDER — LIDOCAINE HCL (PF) 1 % IJ SOLN
INTRAMUSCULAR | Status: DC | PRN
  Administered 2020-11-14: 45 mL

## 2020-11-14 MED ORDER — CHLORHEXIDINE GLUCONATE 4 % EX LIQD
4.0000 | Freq: Once | CUTANEOUS | Status: DC
Start: 2020-11-14 — End: 2020-11-15

## 2020-11-14 MED ORDER — LIDOCAINE HCL 1 % IJ SOLN
INTRAMUSCULAR | Status: AC
  Filled 2020-11-14: qty 60

## 2020-11-14 MED ORDER — CEFAZOLIN SODIUM-DEXTROSE 2-4 GM/100ML-% IV SOLN
INTRAVENOUS | Status: AC
  Filled 2020-11-14: qty 100

## 2020-11-14 MED ORDER — SODIUM CHLORIDE 0.9 % IV SOLN
INTRAVENOUS | Status: AC
  Filled 2020-11-14: qty 2

## 2020-11-14 MED ORDER — HEPARIN (PORCINE) IN NACL 1000-0.9 UT/500ML-% IV SOLN
INTRAVENOUS | Status: DC | PRN
  Administered 2020-11-14: 500 mL

## 2020-11-14 MED ORDER — POVIDONE-IODINE 10 % EX SWAB
2.0000 "application " | Freq: Once | CUTANEOUS | Status: AC
  Administered 2020-11-14: 2 via TOPICAL

## 2020-11-14 MED ORDER — ACETAMINOPHEN 325 MG PO TABS
325.0000 mg | ORAL_TABLET | ORAL | Status: DC | PRN
  Filled 2020-11-14: qty 2

## 2020-11-14 MED ORDER — ONDANSETRON HCL 4 MG/2ML IJ SOLN: 4.0000 mg | Freq: Four times a day (QID) | INTRAMUSCULAR | Status: DC | PRN

## 2020-11-14 MED ORDER — FENTANYL CITRATE (PF) 100 MCG/2ML IJ SOLN
INTRAMUSCULAR | Status: AC
  Filled 2020-11-14: qty 2

## 2020-11-14 MED ORDER — MIDAZOLAM HCL 5 MG/5ML IJ SOLN
INTRAMUSCULAR | Status: AC
  Filled 2020-11-14: qty 5

## 2020-11-14 MED ORDER — SODIUM CHLORIDE 0.9 % IV SOLN
80.0000 mg | INTRAVENOUS | Status: AC
  Administered 2020-11-14: 80 mg

## 2020-11-14 MED ORDER — SODIUM CHLORIDE 0.9 % IV SOLN: INTRAVENOUS | Status: DC

## 2020-11-14 MED ORDER — MIDAZOLAM HCL 5 MG/5ML IJ SOLN
INTRAMUSCULAR | Status: DC | PRN
  Administered 2020-11-14: 1 mg via INTRAVENOUS

## 2020-11-14 MED ORDER — FENTANYL CITRATE (PF) 100 MCG/2ML IJ SOLN
INTRAMUSCULAR | Status: DC | PRN
  Administered 2020-11-14: 12.5 ug via INTRAVENOUS

## 2020-11-14 SURGICAL SUPPLY — 11 items
CABLE SURGICAL S-101-97-12 (CABLE) ×2 IMPLANT
CATH RIGHTSITE C315HIS02 (CATHETERS) ×1 IMPLANT
GUIDEWIRE ANGLED .035X150CM (WIRE) ×1 IMPLANT
LEAD SELECT SECURE 3830 383069 (Lead) IMPLANT
PACEMAKER PRCT MRI CRTP W1TR01 (Pacemaker) IMPLANT
PAD PRO RADIOLUCENT 2001M-C (PAD) ×2 IMPLANT
PPM PRECEPTA MRI CRT-P W1TR01 (Pacemaker) ×2 IMPLANT
SELECT SECURE 3830 383069 (Lead) ×2 IMPLANT
SHEATH 7FR PRELUDE SNAP 13 (SHEATH) ×1 IMPLANT
SLITTER 6232ADJ (MISCELLANEOUS) ×1 IMPLANT
TRAY PACEMAKER INSERTION (PACKS) ×2 IMPLANT

## 2020-11-14 NOTE — Discharge Instructions (Addendum)
    Supplemental Discharge Instructions for  Pacemaker/Defibrillator Patients  Tomorrow, 11/15/20, send in a device transmission  Activity No heavy lifting or vigorous activity with your left/right arm for 6 to 8 weeks.  Do not raise your left/right arm above your head for one week.  Gradually raise your affected arm as drawn below.              11/15/20                   11/20/20                     11/21/20                 11/22/20 __  NO DRIVING for  1 week   ; you may begin driving on  [id number]  .  WOUND CARE Keep the wound area clean and dry.  Do not get this area wet , no showers for one week; you may shower on  11/22/20   .  The tape/steri-strips on your wound will fall off; do not pull them off.  No bandage is needed on the site.  DO  NOT apply any creams, oils, or ointments to the wound area. If you notice any drainage or discharge from the wound, any swelling or bruising at the site, or you develop a fever > 101? F after you are discharged home, call the office at once.  Special Instructions You are still able to use cellular telephones; use the ear opposite the side where you have your pacemaker/defibrillator.  Avoid carrying your cellular phone near your device. When traveling through airports, show security personnel your identification card to avoid being screened in the metal detectors.  Ask the security personnel to use the hand wand. Avoid arc welding equipment, MRI testing (magnetic resonance imaging), TENS units (transcutaneous nerve stimulators).  Call the office for questions about other devices. Avoid electrical appliances that are in poor condition or are not properly grounded. Microwave ovens are safe to be near or to operate.

## 2020-11-14 NOTE — H&P (Signed)
    HPI [name] returns today for followup. He has a h/o chronic systolic heartf ailure, s/p biv PPM insertion. His LV lead is chronically elevated in pacing threshold. His other vectors have diaghragmatic stimulation.  He has developed atrial fib with a controlled Vr. The patient has had worsening CHF symptoms. He has had problems with bleeding on systemic anti-coagulation. He had his diuretic switched from lasix to torsemide.He has done surprisingly well since his last visit. He admits to sodium indiscretion. He is approaching ERI.  Allergies  Allergen Reactions  . Bee Venom Anaphylaxis    Honey bee      Current Outpatient Medications  Medication Sig Dispense Refill  . allopurinol (ZYLOPRIM) 100 MG tablet 2 qd for gout prevention 60 tablet 5  . blood glucose meter kit and supplies Dispense based on patient and insurance preference. Use to check blood sugar once daily as directed. For E11.9. 1 each 5  . clobetasol cream (TEMOVATE) 0.05 % APPLY LIGHTLY TO AFFECTEDUSKIN TWICE DAILY FOR 14EDAYS    . ENTRESTO 24-26 MG TAKE ONE TABLET BY MOUTH 2 TIMES A DAY. 60 tablet 6  . finasteride (PROSCAR) 5 MG tablet TAKE ONE TABLET BY MOUTH ONCE DAILY 90 tablet 3  . metFORMIN (GLUCOPHAGE) 500 MG tablet TAKE ONE TABLET BY MOUTH DAILY. PLEASE CONTACT OFFICE FOR SUMMER APPOINTMENT 90 tablet 1  . metoprolol succinate (TOPROL-XL) 25 MG 24 hr tablet TAKE 1 TABLET BY MOUTH TWICE DAILY. 60 tablet 11  . potassium chloride (KLOR-CON) 10 MEQ tablet Take 1 tablet (10 mEq total) by mouth daily. 90 tablet 3  . silodosin (RAPAFLO) 8 MG CAPS capsule Take 1 capsule (8 mg total) by mouth in the morning and at bedtime. 60 capsule 11  . simvastatin (ZOCOR) 40 MG tablet 1 qd 90 tablet 1  . torsemide (DEMADEX) 20 MG tablet TAKE 1 TABLET BY MOUTH TWICE A DAY. 60 tablet 6   No current facility-administered medications for this visit.     Past Medical History:  Diagnosis Date  . Arthritis    "right knee" (12/19/2015)   . CHF (congestive heart failure) (HCC)    Abnormal echo January 2017  . History of colon polyps   . History of gout   . Hyperlipidemia   . Hypertension   . Myocardial infarction (HCC)    "they said I'd had a heart attack but didn't know it" (12/19/2015)  . Presence of permanent cardiac pacemaker   . Seasonal allergies   . Skin cancer    "arms/hands" (12/19/2015)  . Sleep apnea    supposed to wear CPAP, but doesn't (12/19/2015)  . Type II diabetes mellitus (HCC)     ROS:   All systems reviewed and negative except as noted in the HPI.   Past Surgical History:  Procedure Laterality Date  . CATARACT EXTRACTION W/PHACO Right 09/08/2012   Procedure: CATARACT EXTRACTION PHACO AND INTRAOCULAR LENS PLACEMENT (IOC);  Surgeon: Anthony Hunt, Lucas;  Location: [facility];  Service: Ophthalmology;  Laterality: Right;  CDE: 21.28  . CATARACT EXTRACTION W/PHACO Left 12/08/2012   Procedure: CATARACT EXTRACTION PHACO AND INTRAOCULAR LENS PLACEMENT (IOC);  Surgeon: Anthony Hunt, Lucas;  Location: [facility];  Service: Ophthalmology;  Laterality: Left;  CDE:25.72  . CHOLECYSTECTOMY OPEN  1968  . COLONOSCOPY     4 procedures  . COLONOSCOPY  2012   Dr. Patterson: diverticulosis, lipoma in ascending colon.   . CYSTOSCOPY WITH FULGERATION N/A 08/11/2017   Procedure: CYSTOSCOPY WITH FULGERATION OF PROSTATIC   VARICES;  Surgeon: Anthony Lucas, Anthony Lucas;  Location: [facility];  Service: Urology;  Laterality: N/A;  . EP IMPLANTABLE DEVICE N/A 12/19/2015   Procedure: BiV Pacemaker Insertion CRT-P;  Surgeon: Anthony Locicero W Ori Trejos, Lucas;  [facility];  Service: Cardiovascular;  Laterality: N/A;  . INSERT / REPLACE / REMOVE PACEMAKER  12/19/2015  . JOINT REPLACEMENT Left    knee  . POLYPECTOMY    . TONSILLECTOMY    . TOTAL KNEE ARTHROPLASTY Left 2000s  . TRANSURETHRAL RESECTION OF PROSTATE    . YAG LASER APPLICATION Right 06/16/2016   Procedure: YAG LASER APPLICATION;  Surgeon: Anthony Shapiro, Lucas;  Location: [facility];  Service:  Ophthalmology;  Laterality: Right;  . YAG LASER APPLICATION Left 06/30/2016   Procedure: YAG LASER APPLICATION;  Surgeon: Anthony Shapiro, Lucas;  Location: [facility];  Service: Ophthalmology;  Laterality: Left;     Family History  Problem Relation Age of Onset  . Lung cancer Father   . Colon cancer Neg Hx      Social History   Socioeconomic History  . Marital status: Widowed    Spouse name: Not on file  . Number of children: Not on file  . Years of education: Not on file  . Highest education level: Not on file  Occupational History  . Not on file  Tobacco Use  . Smoking status: Never Smoker  . Smokeless tobacco: Never Used  Vaping Use  . Vaping Use: Never used  Substance and Sexual Activity  . Alcohol use: Not Currently    Alcohol/week: 2.0 standard drinks    Types: 2 Glasses of wine per week  . Drug use: No  . Sexual activity: Not Currently    Birth control/protection: None  Other Topics Concern  . Not on file  Social History Narrative  . Not on file   Social Determinants of Health   Financial Resource Strain: Not on file  Food Insecurity: Not on file  Transportation Needs: Not on file  Physical Activity: Not on file  Stress: Not on file  Social Connections: Not on file  Intimate Partner Violence: Not on file     BP 130/68   Pulse 84   Ht 6' 1.5" (1.867 m)   Wt 187 lb (84.8 kg)   SpO2 97%   BMI 24.34 kg/m   Physical Exam:  Well appearing NAD HEENT: Unremarkable Neck:  No JVD, no thyromegally Lymphatics:  No adenopathy Back:  No CVA tenderness Lungs:  Clear with no wheezes HEART:  Regular rate rhythm, no murmurs, no rubs, no clicks Abd:  soft, positive bowel sounds, no organomegally, no rebound, no guarding Ext:  2 plus pulses, no edema, no cyanosis, no clubbing Skin:  No rashes no nodules Neuro:  CN II through XII intact, motor grossly intact   DEVICE  Normal device function except for elevated LV pacing threshold.  See PaceArt for details.    Assess/Plan: 1. Chronic systolic heart failure - his symptoms are class 2 on medical therapy. He will follow. 2. BIV PPM - his device is approaching ERI. I discussed the treatment options and would recommend left bundle area pacing though I discussed epicardial lead placement as well as a straight gen change out. We will plan for left bundle area pacing if his LSCV is patent. 3. CHB - he has no escape today, s/p PPM. 4. Atrial fib - his VR is well controlled. He will continue his current meds. He is not a candidate for systemic   anti-coag due to bleeding.   Naiya Corral,Lucas 

## 2020-11-15 ENCOUNTER — Encounter: Payer: Medicare Other | Admitting: Internal Medicine

## 2020-11-15 ENCOUNTER — Telehealth: Payer: Self-pay

## 2020-11-15 ENCOUNTER — Encounter (HOSPITAL_COMMUNITY): Payer: Self-pay | Admitting: Internal Medicine

## 2020-11-15 MED FILL — Lidocaine HCl Local Inj 1%: INTRAMUSCULAR | Qty: 45 | Status: AC

## 2020-11-15 NOTE — Telephone Encounter (Signed)
-----   Message from [facility], [place] sent at 11/15/2020 12:38 PM EDT ----- This is also a same day discharge from yesterday   Though he is coming in to see GT at 1:00 today for removal of his pressure dressing  MDT CRT upgrade/gen change

## 2020-11-15 NOTE — Telephone Encounter (Signed)
Attempted to contact patient about same ay d/c. No answer - unable to leave VM.   Will need implant sheet from [employer].

## 2020-11-18 ENCOUNTER — Encounter (HOSPITAL_COMMUNITY): Payer: Self-pay | Admitting: Internal Medicine

## 2020-11-19 ENCOUNTER — Encounter (HOSPITAL_COMMUNITY): Payer: Self-pay | Admitting: Internal Medicine

## 2020-11-19 ENCOUNTER — Telehealth: Payer: Self-pay

## 2020-11-19 NOTE — Telephone Encounter (Signed)
Patient and his wife called in stating that the monitor is not working so we called tech support and they are sending a new handheld and a cell stick. The patient should get it in 7-10 days

## 2020-11-19 NOTE — Telephone Encounter (Signed)
Follow-up after same day discharge: Implant date: 11/14/20 MD: Cristopher Peru, MD Device: [employer] CRT-P Location: Left chest   Wound check visit: 11/27/20 90 day MD follow-up: 02/25/21  Remote Transmission received:No- Carelink updated with device information.  Patient daughter tried to send remote transmission.  Error message received.  She will try again later.  Provided with [employer] tech support # to call if issue continues.    Dressing removed: Steri-strips are in-tact per daughter.  Pt v/u to sponge bath until wound check appt.

## 2020-11-21 ENCOUNTER — Other Ambulatory Visit: Payer: Self-pay

## 2020-11-21 ENCOUNTER — Ambulatory Visit (INDEPENDENT_AMBULATORY_CARE_PROVIDER_SITE_OTHER): Payer: Medicare Other | Admitting: Urology

## 2020-11-21 ENCOUNTER — Encounter: Payer: Self-pay | Admitting: Urology

## 2020-11-21 VITALS — BP 133/67 | HR 76

## 2020-11-21 DIAGNOSIS — R300 Dysuria: Secondary | ICD-10-CM

## 2020-11-21 DIAGNOSIS — N3020 Other chronic cystitis without hematuria: Secondary | ICD-10-CM | POA: Diagnosis not present

## 2020-11-21 DIAGNOSIS — N3021 Other chronic cystitis with hematuria: Secondary | ICD-10-CM | POA: Diagnosis not present

## 2020-11-21 DIAGNOSIS — I25118 Atherosclerotic heart disease of native coronary artery with other forms of angina pectoris: Secondary | ICD-10-CM

## 2020-11-21 LAB — MICROSCOPIC EXAMINATION
RBC, Urine: NONE SEEN /hpf (ref 0–2)
WBC, UA: >30 /hpf — ABNORMAL HIGH (ref 0–5)

## 2020-11-21 LAB — URINALYSIS, ROUTINE W REFLEX MICROSCOPIC
Bilirubin, UA: NEGATIVE
Glucose, UA: NEGATIVE
Ketones, UA: NEGATIVE
Nitrite, UA: POSITIVE — AB
Specific Gravity, UA: 1.015 (ref 1.005–1.030)
Urobilinogen, Ur: 0.2 mg/dL (ref 0.2–1.0)
pH, UA: 5.5 (ref 5.0–7.5)

## 2020-11-21 MED ORDER — SULFAMETHOXAZOLE-TRIMETHOPRIM 800-160 MG PO TABS: 1.0000 | ORAL_TABLET | Freq: Two times a day (BID) | ORAL | 0 refills | Status: DC

## 2020-11-21 NOTE — Progress Notes (Signed)
11/21/2020 12:14 PM   Anthony Lucas 1931/01/14 [id number]  Referring provider: Kathyrn Drown, MD [address],  [place] [address]  Followup BPH and UTIs  HPI: Anthony Lucas is a 87yo here for a 2wk history of mild dysuria and he is concerned about a recurrent UTI which he has a few times a year.  He had a culture in 4/22 that grew staph epi and he was given nitrofurantoin for treatment and then nightly for suppression but he didn't continue it after the initial treatment. His UA looks infected today.  He has stable LUTS on rapaflo $RemoveBe'8mg'izHpAcRbV$  BID and finasteride IPSS 12 QOL 3. Nocturia 2-3x based on fluid consumption. Stream strong.  His Cr was 1.23 on 11/12/20.  He was given a dose of ancef and gent on 8/11 when he had a pacemaker procedure.   Prior records, recent labs and cultures reviewed.   History updated.   PMH: Past Medical History:  Diagnosis Date   Arthritis    "right knee" (12/19/2015)   CHF (congestive heart failure) (HCC)    Abnormal echo January 2017   History of colon polyps    History of gout    Hyperlipidemia    Hypertension    Myocardial infarction [facility])    "they said I'd had a heart attack but didn't know it" (12/19/2015)   Presence of permanent cardiac pacemaker    Seasonal allergies    Skin cancer    "arms/hands" (12/19/2015)   Sleep apnea    supposed to wear CPAP, but doesn't (12/19/2015)   Type II diabetes mellitus ([place])     Surgical History: Past Surgical History:  Procedure Laterality Date   BIV PACEMAKER GENERATOR CHANGEOUT N/A 11/14/2020   Procedure: BIV PACEMAKER GENERATOR CHANGEOUT;  Surgeon: Evans Lance, MD;  Location: [place] [facility];  Service: Cardiovascular;  Laterality: N/A;   CATARACT EXTRACTION W/PHACO Right 09/08/2012   Procedure: CATARACT EXTRACTION PHACO AND INTRAOCULAR LENS PLACEMENT (IOC);  Surgeon: Tonny Branch, MD;  Location: [facility];  Service: Ophthalmology;  Laterality: Right;  CDE: 21.28   CATARACT EXTRACTION W/PHACO Left  12/08/2012   Procedure: CATARACT EXTRACTION PHACO AND INTRAOCULAR LENS PLACEMENT (IOC);  Surgeon: Tonny Branch, MD;  Location: [facility];  Service: Ophthalmology;  Laterality: Left;  CDE:25.72   CHOLECYSTECTOMY OPEN  1968   COLONOSCOPY     4 procedures   COLONOSCOPY  2012   Dr. Sharlett Iles: diverticulosis, lipoma in ascending colon.    CYSTOSCOPY WITH FULGERATION N/A 08/11/2017   Procedure: CYSTOSCOPY WITH FULGERATION OF PROSTATIC VARICES;  Surgeon: Cleon Gustin, MD;  Location: [facility];  Service: Urology;  Laterality: N/A;   EP IMPLANTABLE DEVICE N/A 12/19/2015   Procedure: BiV Pacemaker Insertion CRT-P;  Surgeon: Evans Lance, MD;  Location: [place] [facility];  Service: Cardiovascular;  Laterality: N/A;   INSERT / REPLACE / REMOVE PACEMAKER  12/19/2015   JOINT REPLACEMENT Left    knee   LEAD REVISION/REPAIR N/A 11/14/2020   Procedure: LEAD REVISION/REPAIR;  Surgeon: Evans Lance, MD;  Location: [place] [facility];  Service: Cardiovascular;  Laterality: N/A;   POLYPECTOMY     TONSILLECTOMY     TOTAL KNEE ARTHROPLASTY Left 2000s   TRANSURETHRAL RESECTION OF PROSTATE     YAG LASER APPLICATION Right 7/61/6073   Procedure: YAG LASER APPLICATION;  Surgeon: [name], MD;  Location: [facility];  Service: Ophthalmology;  Laterality: Right;   YAG LASER APPLICATION Left 10/14/6267   Procedure:  YAG LASER APPLICATION;  Surgeon: Jethro Bolus, MD;  Location: [facility];  Service: Ophthalmology;  Laterality: Left;    Home Medications:  Allergies as of 11/21/2020       Reactions   Bee Venom Anaphylaxis   Honey bee         Medication List        Accurate as of November 21, 2020 12:14 PM. If you have any questions, ask your nurse or doctor.          STOP taking these medications    nitrofurantoin (macrocrystal-monohydrate) 100 MG capsule Commonly known as: MACROBID Stopped by: Anthony Pippin, MD   nitrofurantoin 50 MG capsule Commonly known as: Macrodantin Stopped by: Anthony Pippin, MD        TAKE these medications    allopurinol 100 MG tablet Commonly known as: ZYLOPRIM 2 qd for gout prevention   blood glucose meter kit and supplies Dispense based on patient and insurance preference. Use to check blood sugar once daily as directed. For E11.9.   clobetasol cream 0.05 % Commonly known as: TEMOVATE Apply 1 application topically 2 (two) times daily as needed (burns on hands).   docusate sodium 100 MG capsule Commonly known as: COLACE Take 100 mg by mouth daily.   Entresto 24-26 MG Generic drug: sacubitril-valsartan TAKE ONE TABLET BY MOUTH 2 TIMES A DAY.   finasteride 5 MG tablet Commonly known as: PROSCAR TAKE ONE TABLET BY MOUTH ONCE DAILY   metFORMIN 500 MG tablet Commonly known as: GLUCOPHAGE TAKE ONE TABLET BY MOUTH DAILY. PLEASE CONTACT OFFICE FOR SUMMER APPOINTMENT   metoprolol succinate 25 MG 24 hr tablet Commonly known as: TOPROL-XL TAKE 1 TABLET BY MOUTH TWICE DAILY. What changed: when to take this   potassium chloride 10 MEQ tablet Commonly known as: KLOR-CON Take 10 mEq by mouth daily.   silodosin 8 MG Caps capsule Commonly known as: RAPAFLO Take 1 capsule (8 mg total) by mouth in the morning and at bedtime.   simvastatin 40 MG tablet Commonly known as: ZOCOR TAKE ONE TABLET BY MOUTH EVERY DAY   sulfamethoxazole-trimethoprim 800-160 MG tablet Commonly known as: BACTRIM DS Take 1 tablet by mouth every 12 (twelve) hours. Started by: Anthony Pippin, MD   torsemide 20 MG tablet Commonly known as: DEMADEX TAKE 1 TABLET BY MOUTH TWICE A DAY. What changed: when to take this        Allergies:  Allergies  Allergen Reactions   Bee Venom Anaphylaxis    Honey bee     Family History: Family History  Problem Relation Age of Onset   Lung cancer Father    Colon cancer Neg Hx     Social History:  reports that he has never smoked. He has never used smokeless tobacco. He reports that he does not currently use alcohol after a past usage of  about 2.0 standard drinks per week. He reports that he does not use drugs.  ROS: All other review of systems were reviewed and are negative except what is noted above in HPI  Physical Exam: BP 133/67   Pulse 76   Constitutional:  Alert and oriented, No acute distress.   Laboratory Data: Lab Results  Component Value Date   WBC 6.8 11/12/2020   HGB 12.3 (L) 11/12/2020   HCT 39.6 11/12/2020   MCV 90.2 11/12/2020   PLT 171 11/12/2020    Lab Results  Component Value Date   CREATININE 1.23 11/12/2020    No results found for: PSA  No results found for: TESTOSTERONE  Lab Results  Component Value Date   HGBA1C 7.2 (H) 07/09/2020    Urinalysis    Component Value Date/Time   APPEARANCEUR Cloudy (A) 07/29/2020 1410   GLUCOSEU Negative 07/29/2020 1410   BILIRUBINUR Negative 07/29/2020 1410   PROTEINUR Negative 07/29/2020 1410   UROBILINOGEN 0.2 06/27/2019 1403   NITRITE Positive (A) 07/29/2020 1410   LEUKOCYTESUR 2+ (A) 07/29/2020 1410    Lab Results  Component Value Date   LABMICR See below: 07/29/2020   WBCUA >30 (A) 07/29/2020   LABEPIT 0-10 07/29/2020   BACTERIA Many (A) 07/29/2020   No results found for this or any previous visit (from the past 24 hour(s)).   UA has >30 WBC and bacteria today.   Result entry pending.  Pertinent Imaging:  No results found for this or any previous visit.  No results found for this or any previous visit.  No results found for this or any previous visit.  No results found for this or any previous visit.  No results found for this or any previous visit.  No results found for this or any previous visit.  No results found for this or any previous visit.  No results found for this or any previous visit.   Assessment & Plan:      1. Chronic cystitis with an acute flair -Culture today -Bactrim ds bid x 7d sent.  -Urine recheck in 3-4 weeks.   2. Incomplete emptying of bladder -continue finasteride 5mg  and rapaflo  8mg  BID   Return in about 4 weeks (around 12/19/2020) for f/u with Dr. Alyson Ingles in 3-4 weeks.  Irine Seal, MD  [facility]  Patient ID: Anthony Lucas, male   DOB: 03-Nov-1930, 87 y.o.   MRN: [id number]

## 2020-11-21 NOTE — Progress Notes (Signed)
Remote pacemaker transmission.   

## 2020-11-21 NOTE — Progress Notes (Signed)
Urological Symptom Review  Patient is experiencing the following symptoms: Burning/pain with urination   Review of Systems  Gastrointestinal (upper)  : Negative for upper GI symptoms  Gastrointestinal (lower) : Negative for lower GI symptoms  Constitutional : Negative for symptoms  Skin: Negative for skin symptoms  Eyes: Negative for eye symptoms  Ear/Nose/Throat : Negative for Ear/Nose/Throat symptoms  Hematologic/Lymphatic: Negative for Hematologic/Lymphatic symptoms  Cardiovascular : Negative for cardiovascular symptoms  Respiratory : Negative for respiratory symptoms  Endocrine: Negative for endocrine symptoms  Musculoskeletal: Negative for musculoskeletal symptoms  Neurological: Negative for neurological symptoms  Psychologic: Negative for psychiatric symptoms 

## 2020-11-21 NOTE — Addendum Note (Signed)
Addended by: Cheri Kearns A on: 11/21/2020 10:37 AM   Modules accepted: Level of Service

## 2020-11-24 LAB — URINE CULTURE

## 2020-11-26 DIAGNOSIS — L308 Other specified dermatitis: Secondary | ICD-10-CM | POA: Diagnosis not present

## 2020-11-26 DIAGNOSIS — D225 Melanocytic nevi of trunk: Secondary | ICD-10-CM | POA: Diagnosis not present

## 2020-11-26 DIAGNOSIS — L570 Actinic keratosis: Secondary | ICD-10-CM | POA: Diagnosis not present

## 2020-11-26 DIAGNOSIS — X32XXXD Exposure to sunlight, subsequent encounter: Secondary | ICD-10-CM | POA: Diagnosis not present

## 2020-11-27 ENCOUNTER — Other Ambulatory Visit: Payer: Self-pay

## 2020-11-27 ENCOUNTER — Ambulatory Visit (INDEPENDENT_AMBULATORY_CARE_PROVIDER_SITE_OTHER): Payer: Medicare Other

## 2020-11-27 DIAGNOSIS — I5020 Unspecified systolic (congestive) heart failure: Secondary | ICD-10-CM

## 2020-11-27 LAB — CUP PACEART INCLINIC DEVICE CHECK
Battery Remaining Longevity: 120 mo
Battery Voltage: 3.17 V
Brady Statistic RA Percent Paced: 0 %
Brady Statistic RV Percent Paced: 97.72 %
Date Time Interrogation Session: 20220824142035
Implantable Lead Implant Date: 20170914
Implantable Lead Implant Date: 20170914
Implantable Lead Implant Date: 20220811
Implantable Lead Location: 753858
Implantable Lead Location: 753859
Implantable Lead Location: 753860
Implantable Lead Model: 3830
Implantable Lead Model: 5076
Implantable Lead Model: 5076
Implantable Pulse Generator Implant Date: 20220811
Lead Channel Impedance Value: 247 Ohm
Lead Channel Impedance Value: 323 Ohm
Lead Channel Impedance Value: 361 Ohm
Lead Channel Impedance Value: 380 Ohm
Lead Channel Impedance Value: 437 Ohm
Lead Channel Impedance Value: 513 Ohm
Lead Channel Impedance Value: 532 Ohm
Lead Channel Impedance Value: 551 Ohm
Lead Channel Impedance Value: 570 Ohm
Lead Channel Pacing Threshold Amplitude: 0.5 V
Lead Channel Pacing Threshold Amplitude: 1 V
Lead Channel Pacing Threshold Pulse Width: 0.4 ms
Lead Channel Pacing Threshold Pulse Width: 0.5 ms
Lead Channel Sensing Intrinsic Amplitude: 1 mV
Lead Channel Setting Pacing Amplitude: 2 V
Lead Channel Setting Pacing Amplitude: 2.25 V
Lead Channel Setting Pacing Pulse Width: 0.4 ms
Lead Channel Setting Pacing Pulse Width: 0.5 ms
Lead Channel Setting Sensing Sensitivity: 2.8 mV

## 2020-11-27 NOTE — Patient Instructions (Signed)
   After Your Pacemaker   Monitor your pacemaker site for redness, swelling, and drainage. Call the device clinic at [phone] if you experience these symptoms or fever/chills.  Your incision was closed with Steri-strips or staples:  You may shower 7 days after your procedure and wash your incision with soap and water. Avoid lotions, ointments, or perfumes over your incision until it is well-healed.  You may use a hot tub or a pool after your wound check appointment if the incision is completely closed.  Do not lift, push or pull greater than 10 pounds with the affected arm until 6 weeks after your procedure. There are no other restrictions in arm movement after your wound check appointment.  You may drive, unless driving has been restricted by your healthcare providers.  Your Pacemaker is not MRI compatible.  Remote monitoring is used to monitor your pacemaker from home. This monitoring is scheduled every 91 days by our office. It allows [place] to keep an eye on the functioning of your device to ensure it is working properly. You will routinely see your Electrophysiologist annually (more often if necessary).

## 2020-11-27 NOTE — Progress Notes (Signed)
Wound check appointment. Steri-strips removed. Wound without redness or edema. Incision edges approximated, wound well healed. Normal device function. Thresholds, P wave sensing, and impedances consistent with implant measurements. Device programmed at appropriate safety margin for chronic leads. Histogram distribution appropriate for patient and level of activity. AF Burden 100%, known issue.  No high ventricular rates noted. Patient educated about wound care, arm mobility, lifting restrictions. Patient is enrolled in remote monitoring, next scheduled check 02/18/21.  ROV with Dr. Lovena Le on 11/22 in [place].

## 2020-11-28 ENCOUNTER — Telehealth: Payer: Self-pay

## 2020-11-28 NOTE — Telephone Encounter (Signed)
Patient's daughter Malachy Mood, calling for patient.  Finished medication for UTI today.  Still having symptoms.  Wanting to know if patient is needing more medication?  Please advise.  Call back:  Daughter Malachy Mood at [phone]  Thanks, Helene Kelp

## 2020-11-29 ENCOUNTER — Other Ambulatory Visit: Payer: Self-pay

## 2020-11-29 ENCOUNTER — Other Ambulatory Visit: Payer: Medicare Other

## 2020-11-29 DIAGNOSIS — N390 Urinary tract infection, site not specified: Secondary | ICD-10-CM

## 2020-11-29 LAB — MICROSCOPIC EXAMINATION
Bacteria, UA: NONE SEEN
Renal Epithel, UA: NONE SEEN /hpf

## 2020-11-29 LAB — URINALYSIS, ROUTINE W REFLEX MICROSCOPIC
Bilirubin, UA: NEGATIVE
Glucose, UA: NEGATIVE
Ketones, UA: NEGATIVE
Nitrite, UA: NEGATIVE
Protein,UA: NEGATIVE
Specific Gravity, UA: 1.015 (ref 1.005–1.030)
Urobilinogen, Ur: 0.2 mg/dL (ref 0.2–1.0)
pH, UA: 5.5 (ref 5.0–7.5)

## 2020-11-29 MED ORDER — SULFAMETHOXAZOLE-TRIMETHOPRIM 800-160 MG PO TABS: 1.0000 | ORAL_TABLET | Freq: Two times a day (BID) | ORAL | 0 refills | Status: DC

## 2020-11-29 NOTE — Telephone Encounter (Signed)
Patient's daughter is calling back to check on medication. Wants a call back to let her know if this can be done and before the weekend.  Thanks, Helene Kelp

## 2020-11-29 NOTE — Telephone Encounter (Signed)
Patient will come leave urine sample today to reassess per Dr. Alyson Ingles and then determine if medication is needed. Daughter voiced understanding.

## 2020-11-29 NOTE — Progress Notes (Signed)
Patient here for ua/uc after finishing antibiotic medication yesterday he still felt like he had UTI symptoms. Dr. Alyson Ingles reviewed UA result and will send in another 5 days pending urine culture. Patient aware medication sent to pharmacy.

## 2020-12-01 LAB — URINE CULTURE: Organism ID, Bacteria: NO GROWTH

## 2020-12-16 ENCOUNTER — Other Ambulatory Visit: Payer: Self-pay | Admitting: Urology

## 2020-12-16 DIAGNOSIS — N390 Urinary tract infection, site not specified: Secondary | ICD-10-CM

## 2020-12-20 ENCOUNTER — Encounter: Payer: Self-pay | Admitting: Urology

## 2020-12-20 ENCOUNTER — Ambulatory Visit (INDEPENDENT_AMBULATORY_CARE_PROVIDER_SITE_OTHER): Payer: Medicare Other | Admitting: Urology

## 2020-12-20 ENCOUNTER — Other Ambulatory Visit: Payer: Self-pay

## 2020-12-20 VITALS — BP 110/62 | HR 80

## 2020-12-20 DIAGNOSIS — I25118 Atherosclerotic heart disease of native coronary artery with other forms of angina pectoris: Secondary | ICD-10-CM

## 2020-12-20 DIAGNOSIS — N3020 Other chronic cystitis without hematuria: Secondary | ICD-10-CM

## 2020-12-20 DIAGNOSIS — R339 Retention of urine, unspecified: Secondary | ICD-10-CM | POA: Diagnosis not present

## 2020-12-20 LAB — URINALYSIS, ROUTINE W REFLEX MICROSCOPIC
Bilirubin, UA: NEGATIVE
Glucose, UA: NEGATIVE
Ketones, UA: NEGATIVE
Nitrite, UA: POSITIVE — AB
Protein,UA: NEGATIVE
Specific Gravity, UA: 1.015 (ref 1.005–1.030)
Urobilinogen, Ur: 0.2 mg/dL (ref 0.2–1.0)
pH, UA: 5.5 (ref 5.0–7.5)

## 2020-12-20 LAB — MICROSCOPIC EXAMINATION
Renal Epithel, UA: NONE SEEN /hpf
WBC, UA: >30 /hpf — AB (ref 0–5)

## 2020-12-20 MED ORDER — FINASTERIDE 5 MG PO TABS: ORAL_TABLET | ORAL | 3 refills | Status: DC

## 2020-12-20 MED ORDER — SILODOSIN 8 MG PO CAPS: 8.0000 mg | ORAL_CAPSULE | Freq: Two times a day (BID) | ORAL | 11 refills | Status: DC

## 2020-12-20 NOTE — Patient Instructions (Signed)
Benign Prostatic Hyperplasia Benign prostatic hyperplasia (BPH) is an enlarged prostate gland that is caused by the normal aging process and not by cancer. The prostate is a walnut-sized gland that is involved in the production of semen. It is located in front of the rectum and below the bladder. The bladder stores urine and the urethra is the tube that carries the urine out of the body. The prostate may get bigger as a man gets older. An enlarged prostate can press on the urethra. This can make it harder to pass urine. The build-up of urine in the bladder can cause infection. Back pressure and infection may progress to bladder damage and kidney (renal) failure. What are the causes? This condition is part of a normal aging process. However, not all men develop problems from this condition. If the prostate enlarges away from the urethra, urine flow will not be blocked. If it enlarges toward the urethra and compresses it, there will be problems passing urine. What increases the risk? This condition is more likely to develop in men over the age of 50 years. What are the signs or symptoms? Symptoms of this condition include: Getting up often during the night to urinate. Needing to urinate frequently during the day. Difficulty starting urine flow. Decrease in size and strength of your urine stream. Leaking (dribbling) after urinating. Inability to pass urine. This needs immediate treatment. Inability to completely empty your bladder. Pain when you pass urine. This is more common if there is also an infection. Urinary tract infection (UTI). How is this diagnosed? This condition is diagnosed based on your medical history, a physical exam, and your symptoms. Tests will also be done, such as: A post-void bladder scan. This measures any amount of urine that may remain in your bladder after you finish urinating. A digital rectal exam. In a rectal exam, your health care provider checks your prostate by  putting a lubricated, gloved finger into your rectum to feel the back of your prostate gland. This exam detects the size of your gland and any abnormal lumps or growths. An exam of your urine (urinalysis). A prostate specific antigen (PSA) screening. This is a blood test used to screen for prostate cancer. An ultrasound. This test uses sound waves to electronically produce a picture of your prostate gland. Your health care provider may refer you to a specialist in kidney and prostate diseases (urologist). How is this treated? Once symptoms begin, your health care provider will monitor your condition (active surveillance or watchful waiting). Treatment for this condition will depend on the severity of your condition. Treatment may include: Observation and yearly exams. This may be the only treatment needed if your condition and symptoms are mild. Medicines to relieve your symptoms, including: Medicines to shrink the prostate. Medicines to relax the muscle of the prostate. Surgery in severe cases. Surgery may include: Prostatectomy. In this procedure, the prostate tissue is removed completely through an open incision or with a laparoscope or robotics. Transurethral resection of the prostate (TURP). In this procedure, a tool is inserted through the opening at the tip of the penis (urethra). It is used to cut away tissue of the inner core of the prostate. The pieces are removed through the same opening of the penis. This removes the blockage. Transurethral incision (TUIP). In this procedure, small cuts are made in the prostate. This lessens the prostate's pressure on the urethra. Transurethral microwave thermotherapy (TUMT). This procedure uses microwaves to create heat. The heat destroys and removes a   small amount of prostate tissue. Transurethral needle ablation (TUNA). This procedure uses radio frequencies to destroy and remove a small amount of prostate tissue. Interstitial laser coagulation (ILC).  This procedure uses a laser to destroy and remove a small amount of prostate tissue. Transurethral electrovaporization (TUVP). This procedure uses electrodes to destroy and remove a small amount of prostate tissue. Prostatic urethral lift. This procedure inserts an implant to push the lobes of the prostate away from the urethra. Follow these instructions at home: Take over-the-counter and prescription medicines only as told by your health care provider. Monitor your symptoms for any changes. Contact your health care provider with any changes. Avoid drinking large amounts of liquid before going to bed or out in public. Avoid or reduce how much caffeine or alcohol you drink. Give yourself time when you urinate. Keep all follow-up visits as told by your health care provider. This is important. Contact a health care provider if: You have unexplained back pain. Your symptoms do not get better with treatment. You develop side effects from the medicine you are taking. Your urine becomes very dark or has a bad smell. Your lower abdomen becomes distended and you have trouble passing your urine. Get help right away if: You have a fever or chills. You suddenly cannot urinate. You feel lightheaded, or very dizzy, or you faint. There are large amounts of blood or clots in the urine. Your urinary problems become hard to manage. You develop moderate to severe low back or flank pain. The flank is the side of your body between the ribs and the hip. These symptoms may represent a serious problem that is an emergency. Do not wait to see if the symptoms will go away. Get medical help right away. Call your local emergency services (911 in the [place] Do not drive yourself to the hospital. Summary Benign prostatic hyperplasia (BPH) is an enlarged prostate that is caused by the normal aging process and not by cancer. An enlarged prostate can press on the urethra. This can make it hard to pass urine. This  condition is part of a normal aging process and is more likely to develop in men over the age of 50 years. Get help right away if you suddenly cannot urinate. This information is not intended to replace advice given to you by your health care provider. Make sure you discuss any questions you have with your health care provider. Document Revised: 07/03/2020 Document Reviewed: 11/30/2019 Elsevier Patient Education  2022 [employer]  

## 2020-12-20 NOTE — Progress Notes (Signed)
12/20/2020 10:46 AM   Anthony Lucas 1930/05/09 [id number]  Referring provider: Kathyrn Drown, MD [address] [place],  [place] [address]  Followup UTI and BPH   HPI: Anthony Lucas is a 87yo here for followup for BPH with incomplete emptying and recurrent UTI. He remains on rapaflo $RemoveBe'8mg'CqKHmgePn$  BID and finasteride. PVR 478cc. Urine stream fair. Nocturia 2-3x. No feeling of incomplete emptying. He was treated for a UTI in 11/2020. He denies any dysuria or hematuria. No toehr complaints.    PMH: Past Medical History:  Diagnosis Date   Arthritis    "right knee" (12/19/2015)   CHF (congestive heart failure) (HCC)    Abnormal echo January 2017   History of colon polyps    History of gout    Hyperlipidemia    Hypertension    Myocardial infarction [facility])    "they said I'd had a heart attack but didn't know it" (12/19/2015)   Presence of permanent cardiac pacemaker    Seasonal allergies    Skin cancer    "arms/hands" (12/19/2015)   Sleep apnea    supposed to wear CPAP, but doesn't (12/19/2015)   Type II diabetes mellitus ([place])     Surgical History: Past Surgical History:  Procedure Laterality Date   BIV PACEMAKER GENERATOR CHANGEOUT N/A 11/14/2020   Procedure: BIV PACEMAKER GENERATOR CHANGEOUT;  Surgeon: Evans Lance, MD;  Location: [place] [facility];  Service: Cardiovascular;  Laterality: N/A;   CATARACT EXTRACTION W/PHACO Right 09/08/2012   Procedure: CATARACT EXTRACTION PHACO AND INTRAOCULAR LENS PLACEMENT (IOC);  Surgeon: Tonny Branch, MD;  Location: [facility];  Service: Ophthalmology;  Laterality: Right;  CDE: 21.28   CATARACT EXTRACTION W/PHACO Left 12/08/2012   Procedure: CATARACT EXTRACTION PHACO AND INTRAOCULAR LENS PLACEMENT (IOC);  Surgeon: Tonny Branch, MD;  Location: [facility];  Service: Ophthalmology;  Laterality: Left;  CDE:25.72   CHOLECYSTECTOMY OPEN  1968   COLONOSCOPY     4 procedures   COLONOSCOPY  2012   Dr. Sharlett Iles: diverticulosis, lipoma in ascending colon.     CYSTOSCOPY WITH FULGERATION N/A 08/11/2017   Procedure: CYSTOSCOPY WITH FULGERATION OF PROSTATIC VARICES;  Surgeon: Cleon Gustin, MD;  Location: [facility];  Service: Urology;  Laterality: N/A;   EP IMPLANTABLE DEVICE N/A 12/19/2015   Procedure: BiV Pacemaker Insertion CRT-P;  Surgeon: Evans Lance, MD;  Location: [place] [facility];  Service: Cardiovascular;  Laterality: N/A;   INSERT / REPLACE / REMOVE PACEMAKER  12/19/2015   JOINT REPLACEMENT Left    knee   LEAD REVISION/REPAIR N/A 11/14/2020   Procedure: LEAD REVISION/REPAIR;  Surgeon: Evans Lance, MD;  Location: [place] [facility];  Service: Cardiovascular;  Laterality: N/A;   POLYPECTOMY     TONSILLECTOMY     TOTAL KNEE ARTHROPLASTY Left 2000s   TRANSURETHRAL RESECTION OF PROSTATE     YAG LASER APPLICATION Right 4/88/8916   Procedure: YAG LASER APPLICATION;  Surgeon: [name], MD;  Location: [facility];  Service: Ophthalmology;  Laterality: Right;   YAG LASER APPLICATION Left 9/45/0388   Procedure: YAG LASER APPLICATION;  Surgeon: [name], MD;  Location: [facility];  Service: Ophthalmology;  Laterality: Left;    Home Medications:  Allergies as of 12/20/2020       Reactions   Bee Venom Anaphylaxis   Honey bee         Medication List        Accurate as of December 20, 2020 10:46 AM. If you have  any questions, ask your nurse or doctor.          allopurinol 100 MG tablet Commonly known as: ZYLOPRIM 2 qd for gout prevention   blood glucose meter kit and supplies Dispense based on patient and insurance preference. Use to check blood sugar once daily as directed. For E11.9.   clobetasol cream 0.05 % Commonly known as: TEMOVATE Apply 1 application topically 2 (two) times daily as needed (burns on hands).   docusate sodium 100 MG capsule Commonly known as: COLACE Take 100 mg by mouth daily.   Entresto 24-26 MG Generic drug: sacubitril-valsartan TAKE ONE TABLET BY MOUTH 2 TIMES A DAY.   finasteride 5 MG  tablet Commonly known as: PROSCAR TAKE ONE TABLET BY MOUTH ONCE DAILY   metFORMIN 500 MG tablet Commonly known as: GLUCOPHAGE TAKE ONE TABLET BY MOUTH DAILY. PLEASE CONTACT OFFICE FOR SUMMER APPOINTMENT   metoprolol succinate 25 MG 24 hr tablet Commonly known as: TOPROL-XL TAKE 1 TABLET BY MOUTH TWICE DAILY. What changed: when to take this   potassium chloride 10 MEQ tablet Commonly known as: KLOR-CON Take 10 mEq by mouth daily.   silodosin 8 MG Caps capsule Commonly known as: RAPAFLO Take 1 capsule (8 mg total) by mouth in the morning and at bedtime.   simvastatin 40 MG tablet Commonly known as: ZOCOR TAKE ONE TABLET BY MOUTH EVERY DAY   sulfamethoxazole-trimethoprim 800-160 MG tablet Commonly known as: BACTRIM DS TAKE (1) TABLET BY MOUTH EVERY 12 HOURS   torsemide 20 MG tablet Commonly known as: DEMADEX TAKE 1 TABLET BY MOUTH TWICE A DAY. What changed: when to take this        Allergies:  Allergies  Allergen Reactions   Bee Venom Anaphylaxis    Honey bee     Family History: Family History  Problem Relation Age of Onset   Lung cancer Father    Colon cancer Neg Hx     Social History:  reports that he has never smoked. He has never used smokeless tobacco. He reports that he does not currently use alcohol after a past usage of about 2.0 standard drinks per week. He reports that he does not use drugs.  ROS: All other review of systems were reviewed and are negative except what is noted above in HPI  Physical Exam: BP 110/62   Pulse 80   Constitutional:  Alert and oriented, No acute distress. HEENT: [place] AT, moist mucus membranes.  Trachea midline, no masses. Cardiovascular: No clubbing, cyanosis, or edema. Respiratory: Normal respiratory effort, no increased work of breathing. GI: Abdomen is soft, nontender, nondistended, no abdominal masses GU: No CVA tenderness.  Lymph: No cervical or inguinal lymphadenopathy. Skin: No rashes, bruises or suspicious  lesions. Neurologic: Grossly intact, no focal deficits, moving all 4 extremities. Psychiatric: Normal mood and affect.  Laboratory Data: Lab Results  Component Value Date   WBC 6.8 11/12/2020   HGB 12.3 (L) 11/12/2020   HCT 39.6 11/12/2020   MCV 90.2 11/12/2020   PLT 171 11/12/2020    Lab Results  Component Value Date   CREATININE 1.23 11/12/2020    No results found for: PSA  No results found for: TESTOSTERONE  Lab Results  Component Value Date   HGBA1C 7.2 (H) 07/09/2020    Urinalysis    Component Value Date/Time   APPEARANCEUR Clear 11/29/2020 1156   GLUCOSEU Negative 11/29/2020 1156   BILIRUBINUR Negative 11/29/2020 1156   PROTEINUR Negative 11/29/2020 1156   UROBILINOGEN 0.2 06/27/2019 1403  NITRITE Negative 11/29/2020 1156   LEUKOCYTESUR Trace (A) 11/29/2020 1156    Lab Results  Component Value Date   LABMICR See below: 11/29/2020   WBCUA 6-10 (A) 11/29/2020   LABEPIT 0-10 11/29/2020   BACTERIA None seen 11/29/2020    Pertinent Imaging:  No results found for this or any previous visit.  No results found for this or any previous visit.  No results found for this or any previous visit.  No results found for this or any previous visit.  No results found for this or any previous visit.  No results found for this or any previous visit.  No results found for this or any previous visit.  No results found for this or any previous visit.   Assessment & Plan:    1. Chronic cystitis without hematuria -Urine for culture, will call with results. If it is positive we will start prophylactic antibiotics.  2. Incomplete emptying of bladder -Continue rapaflo $RemoveBeforeDEI'8mg'kOQdyMaPWnNhJfRB$  BID and finasteride. - Urinalysis, Routine w reflex microscopic - BLADDER SCAN AMB NON-IMAGING   No follow-ups on file.  Nicolette Bang, MD  [facility] [place]

## 2020-12-20 NOTE — Progress Notes (Signed)
post void residual=478  Urological Symptom Review  Patient is experiencing the following symptoms: none   Review of Systems  Gastrointestinal (upper)  : Negative for upper GI symptoms  Gastrointestinal (lower) : Negative for lower GI symptoms  Constitutional : Negative for symptoms  Skin: Negative for skin symptoms  Eyes: Negative for eye symptoms  Ear/Nose/Throat : Negative for Ear/Nose/Throat symptoms  Hematologic/Lymphatic: Negative for Hematologic/Lymphatic symptoms  Cardiovascular : Negative for cardiovascular symptoms  Respiratory : Negative for respiratory symptoms  Endocrine: Negative for endocrine symptoms  Musculoskeletal: Negative for musculoskeletal symptoms  Neurological: Negative for neurological symptoms  Psychologic: Negative for psychiatric symptoms

## 2020-12-23 ENCOUNTER — Telehealth: Payer: Self-pay

## 2020-12-23 DIAGNOSIS — N390 Urinary tract infection, site not specified: Secondary | ICD-10-CM

## 2020-12-23 LAB — URINE CULTURE

## 2020-12-23 MED ORDER — NITROFURANTOIN MACROCRYSTAL 50 MG PO CAPS: 50.0000 mg | ORAL_CAPSULE | Freq: Every day | ORAL | 5 refills | Status: DC

## 2020-12-23 MED ORDER — NITROFURANTOIN MONOHYD MACRO 100 MG PO CAPS: 100.0000 mg | ORAL_CAPSULE | Freq: Two times a day (BID) | ORAL | 0 refills | Status: DC

## 2020-12-23 NOTE — Telephone Encounter (Addendum)
New prescriptions sent to pharmacy. Daughter aware of changes and voiced understanding. -      ---- Message from Cleon Gustin, MD sent at 12/23/2020  9:51 AM EDT ----- Please give macrobid '100mg'$  bid for 7 days and then start '50mg'$  qhs ----- Message ----- From: Dorisann Frames, RN Sent: 12/23/2020   8:55 AM EDT To: Cleon Gustin, MD  Please advise- given Bactrim 9/15

## 2020-12-24 DIAGNOSIS — E1142 Type 2 diabetes mellitus with diabetic polyneuropathy: Secondary | ICD-10-CM | POA: Diagnosis not present

## 2020-12-24 DIAGNOSIS — B351 Tinea unguium: Secondary | ICD-10-CM | POA: Diagnosis not present

## 2021-01-06 ENCOUNTER — Ambulatory Visit (INDEPENDENT_AMBULATORY_CARE_PROVIDER_SITE_OTHER): Payer: Medicare Other | Admitting: Cardiology

## 2021-01-06 ENCOUNTER — Encounter: Payer: Self-pay | Admitting: Cardiology

## 2021-01-06 ENCOUNTER — Other Ambulatory Visit: Payer: Self-pay

## 2021-01-06 VITALS — BP 124/72 | HR 88 | Ht 73.0 in | Wt 188.2 lb

## 2021-01-06 DIAGNOSIS — I25118 Atherosclerotic heart disease of native coronary artery with other forms of angina pectoris: Secondary | ICD-10-CM | POA: Diagnosis not present

## 2021-01-06 DIAGNOSIS — I10 Essential (primary) hypertension: Secondary | ICD-10-CM | POA: Diagnosis not present

## 2021-01-06 DIAGNOSIS — I5032 Chronic diastolic (congestive) heart failure: Secondary | ICD-10-CM

## 2021-01-06 DIAGNOSIS — I4821 Permanent atrial fibrillation: Secondary | ICD-10-CM

## 2021-01-06 DIAGNOSIS — Z23 Encounter for immunization: Secondary | ICD-10-CM | POA: Diagnosis not present

## 2021-01-06 DIAGNOSIS — I428 Other cardiomyopathies: Secondary | ICD-10-CM

## 2021-01-06 NOTE — Progress Notes (Signed)
Cardiology Office Note   Date:  01/06/2021   ID:  Anthony Lucas, DOB Oct 29, 1930, MRN [id number]  PCP:  Kathyrn Drown, MD Cardiologist: Rozann Lesches, MD Electrphysiologist: Cristopher Peru, MD 11/22/2019  Chief Complaint  Patient presents with   Cardiomyopathy   Congestive Heart Failure   Atrial Fibrillation   Hypertension     History of Present Illness: Anthony Lucas is a 87 y.o. male with a history of Nonischemic DCM (EF initially 20-25% in 2017 but improved to 50-55% in 2020) with Bivent CHF, chronic LBBB, s/p CRT-P with recent battery change out for ERI in Aug by Dr. Lovena Le.  He has a hx of chronic Afib w/ controlled VR, no anticoag 2nd bleeding issues, OSA not on CPAP, HTN, HLD, DM2.    He is here today for followup and is doing well.  He denies any chest pain or pressure, SOB, DOE, PND, orthopnea, LE edema, dizziness (except when getting up too fast), palpitations or syncope. He is compliant with his meds and is tolerating meds with no SE.    Past Medical History:  Diagnosis Date   Arthritis    "right knee" (12/19/2015)   Chronic combined systolic (congestive) and diastolic (congestive) heart failure (HCC)    EF 50-55% in 2020 echo   Complete heart block (HCC)    S/P BiVPPM   History of colon polyps    History of gout    Hyperlipidemia    Hypertension    Myocardial infarction [facility])    "they said I'd had a heart attack but didn't know it" (12/19/2015).  No CAD on cath   Seasonal allergies    Skin cancer    "arms/hands" (12/19/2015)   Sleep apnea    supposed to wear CPAP, but doesn't (12/19/2015)   Type II diabetes mellitus [facility])     Past Surgical History:  Procedure Laterality Date   BIV PACEMAKER GENERATOR CHANGEOUT N/A 11/14/2020   Procedure: BIV PACEMAKER GENERATOR CHANGEOUT;  Surgeon: Evans Lance, MD;  Location: [place] [facility];  Service: Cardiovascular;  Laterality: N/A;   CATARACT EXTRACTION W/PHACO Right 09/08/2012   Procedure: CATARACT EXTRACTION  PHACO AND INTRAOCULAR LENS PLACEMENT (IOC);  Surgeon: Tonny Branch, MD;  Location: [facility];  Service: Ophthalmology;  Laterality: Right;  CDE: 21.28   CATARACT EXTRACTION W/PHACO Left 12/08/2012   Procedure: CATARACT EXTRACTION PHACO AND INTRAOCULAR LENS PLACEMENT (IOC);  Surgeon: Tonny Branch, MD;  Location: [facility];  Service: Ophthalmology;  Laterality: Left;  CDE:25.72   CHOLECYSTECTOMY OPEN  1968   COLONOSCOPY     4 procedures   COLONOSCOPY  2012   Dr. Sharlett Iles: diverticulosis, lipoma in ascending colon.    CYSTOSCOPY WITH FULGERATION N/A 08/11/2017   Procedure: CYSTOSCOPY WITH FULGERATION OF PROSTATIC VARICES;  Surgeon: Cleon Gustin, MD;  Location: [facility];  Service: Urology;  Laterality: N/A;   EP IMPLANTABLE DEVICE N/A 12/19/2015   Procedure: BiV Pacemaker Insertion CRT-P;  Surgeon: Evans Lance, MD;  Location: [place] [facility];  Service: Cardiovascular;  Laterality: N/A;   INSERT / REPLACE / REMOVE PACEMAKER  12/19/2015   JOINT REPLACEMENT Left    knee   LEAD REVISION/REPAIR N/A 11/14/2020   Procedure: LEAD REVISION/REPAIR;  Surgeon: Evans Lance, MD;  Location: [place] [facility];  Service: Cardiovascular;  Laterality: N/A;   POLYPECTOMY     TONSILLECTOMY     TOTAL KNEE ARTHROPLASTY Left 2000s   TRANSURETHRAL RESECTION OF PROSTATE     YAG  LASER APPLICATION Right 4/62/7035   Procedure: YAG LASER APPLICATION;  Surgeon: [name], MD;  Location: [facility];  Service: Ophthalmology;  Laterality: Right;   YAG LASER APPLICATION Left 0/12/3816   Procedure: YAG LASER APPLICATION;  Surgeon: [name], MD;  Location: [facility];  Service: Ophthalmology;  Laterality: Left;    Current Outpatient Medications  Medication Sig Dispense Refill   allopurinol (ZYLOPRIM) 100 MG tablet 2 qd for gout prevention 60 tablet 5   blood glucose meter kit and supplies Dispense based on patient and insurance preference. Use to check blood sugar once daily as directed. For E11.9. 1 each 5   clobetasol cream  (TEMOVATE) 2.99 % Apply 1 application topically 2 (two) times daily as needed (burns on hands).     docusate sodium (COLACE) 100 MG capsule Take 100 mg by mouth daily.     ENTRESTO 24-26 MG TAKE ONE TABLET BY MOUTH 2 TIMES A DAY. (Patient taking differently: Take 1 tablet by mouth 2 (two) times daily.) 60 tablet 6   finasteride (PROSCAR) 5 MG tablet TAKE ONE TABLET BY MOUTH ONCE DAILY 90 tablet 3   metFORMIN (GLUCOPHAGE) 500 MG tablet TAKE ONE TABLET BY MOUTH DAILY. PLEASE CONTACT OFFICE FOR SUMMER APPOINTMENT 90 tablet 0   metoprolol succinate (TOPROL-XL) 25 MG 24 hr tablet TAKE 1 TABLET BY MOUTH TWICE DAILY. (Patient taking differently: Take 25 mg by mouth in the morning and at bedtime.) 60 tablet 11   nitrofurantoin (MACRODANTIN) 50 MG capsule Take 1 capsule (50 mg total) by mouth at bedtime. 30 capsule 5   nitrofurantoin, macrocrystal-monohydrate, (MACROBID) 100 MG capsule Take 1 capsule (100 mg total) by mouth every 12 (twelve) hours. 14 capsule 0   potassium chloride (KLOR-CON) 10 MEQ tablet Take 10 mEq by mouth daily.     silodosin (RAPAFLO) 8 MG CAPS capsule Take 1 capsule (8 mg total) by mouth in the morning and at bedtime. 60 capsule 11   simvastatin (ZOCOR) 40 MG tablet TAKE ONE TABLET BY MOUTH EVERY DAY 90 tablet 1   torsemide (DEMADEX) 20 MG tablet TAKE 1 TABLET BY MOUTH TWICE A DAY. (Patient taking differently: Take 20 mg by mouth 2 (two) times daily.) 180 tablet 3   No current facility-administered medications for this visit.    Allergies:   Bee venom    Social History:  The patient  reports that he has never smoked. He has never used smokeless tobacco. He reports that he does not currently use alcohol after a past usage of about 2.0 standard drinks per week. He reports that he does not use drugs.   Family History:  The patient's family history includes Lung cancer in his father.  He indicated that his mother is deceased. He indicated that his father is deceased. He indicated  that the status of his neg hx is unknown.   ROS:  Please see the history of present illness. All other systems are reviewed and negative.    PHYSICAL EXAM: VS:  BP 124/72   Pulse 88   Ht $R'6\' 1"'DR$  (1.854 m)   Wt 188 lb 3.2 oz (85.4 kg)   SpO2 96%   BMI 24.83 kg/m  , BMI Body mass index is 24.83 kg/m. GEN: Well nourished, well developed in no acute distress HEENT: Normal NECK: No JVD; No carotid bruits LYMPHATICS: No lymphadenopathy CARDIAC:RRR, no murmurs, rubs, gallops RESPIRATORY:  Clear to auscultation without rales, wheezing or rhonchi  ABDOMEN: Soft, non-tender, non-distended MUSCULOSKELETAL:  No edema; No deformity  SKIN: Warm and dry NEUROLOGIC:  Alert and oriented x 3 PSYCHIATRIC:  Normal affect    EKG:  EKG is not ordered today.  ECHO: 01/06/2019  1. Left ventricular ejection fraction, by visual estimation, is 50 to  55%. The left ventricle has normal function. There is mildly increased  left ventricular hypertrophy.   2. Left ventricular diastolic Doppler parameters are indeterminate  pattern of LV diastolic filling.   3. Global right ventricle has normal systolic function.The right  ventricular size is normal. Moderately increased right ventricular wall  thickness.   4. Left atrial size was normal.   5. Right atrial size was moderately dilated.   6. The mitral valve is normal in structure. No evidence of mitral valve  regurgitation. No evidence of mitral stenosis.   7. The tricuspid valve is normal in structure. Tricuspid valve  regurgitation is trivial.   8. The aortic valve has an indeterminant number of cusps Aortic valve  regurgitation was not visualized by color flow Doppler. Mild to moderate  aortic valve sclerosis/calcification without any evidence of aortic  stenosis.   9. The pulmonic valve was not well visualized. Pulmonic valve  regurgitation is not visualized by color flow Doppler.  10. Mildly elevated pulmonary artery systolic pressure.  11. The  inferior vena cava is normal in size with greater than 50%  respiratory variability, suggesting right atrial pressure of 3 mmHg.  12. The interatrial septum was not well visualized.   CATH: 11/14/2008 HEMODYNAMIC DATA:  1. Right atrial pressure 7.  2. RV 38/9.  3. Pulmonary artery 37/18, mean 27.  4. Pulmonary capillary wedge 14.  5. Thermodilution cardiac output 4.0 liters per minute.  6. Thermodilution cardiac index 1.7 liters per minute per meter      squared.  7. Fick cardiac output 5.3 liters per minute.  8. Fick cardiac index 2.3 liters per minute per meter squared.  9. Aortic saturation 96%.  10.Pulmonary artery saturation 68%.  11.Superior vena cava saturation 66%.    ANGIOGRAPHIC DATA:  1. Ventriculography done in the RAO projection revealed preserved with      low normal overall ejection fraction, estimated EF would be in the      range of 50-55%.  On one beat, there was some diastolic mitral      regurgitation but systolic MR was not thought to be prominent.  No      definite wall motion abnormalities were visualized.  2. The right coronary artery is a moderate-sized vessel.  It is      relatively smooth with minimal luminal irregularity.  There is a      large PDA and 2 smaller posterolateral branches, both of which are      without critical narrowing.  3. The left main coronary artery is free of critical disease.  4. The LAD courses to the apex.  After a small diagonal and septal,      there is a little bit of calcified plaque measuring about 30%      luminal reduction.  The distal LAD tapers but is without critical      disease.  5. The circumflex provides predominantly 2 large marginal branches,      both of which are free of critical disease.    CONCLUSIONS:  1. Mild pulmonary hypertension.  2. No critical coronary artery disease.  3. Low normal overall ejection fraction.  4. Abnormal chest x-ray.  Recent Labs: 07/09/2020: ALT 16 11/12/2020: BUN 23;  Creatinine, Ser 1.23;  Hemoglobin 12.3; Platelets 171; Potassium 4.1; Sodium 140  CBC    Component Value Date/Time   WBC 6.8 11/12/2020 0906   RBC 4.39 11/12/2020 0906   HGB 12.3 (L) 11/12/2020 0906   HGB 13.2 10/16/2019 1447   HCT 39.6 11/12/2020 0906   HCT 41.3 10/16/2019 1447   PLT 171 11/12/2020 0906   PLT 165 10/16/2019 1447   MCV 90.2 11/12/2020 0906   MCV 86 10/16/2019 1447   MCH 28.0 11/12/2020 0906   MCHC 31.1 11/12/2020 0906   RDW 14.4 11/12/2020 0906   RDW 13.2 10/16/2019 1447   LYMPHSABS 0.8 11/12/2020 0906   LYMPHSABS 0.9 10/16/2019 1447   MONOABS 0.6 11/12/2020 0906   EOSABS 0.2 11/12/2020 0906   EOSABS 0.3 10/16/2019 1447   BASOSABS 0.1 11/12/2020 0906   BASOSABS 0.1 10/16/2019 1447   CMP Latest Ref Rng & Units 11/12/2020 07/09/2020 02/27/2020  Glucose 70 - 99 mg/dL 127(H) 92 120(H)  BUN 8 - 23 mg/dL $Remove'23 21 17  'rvZDmpo$ Creatinine 0.61 - 1.24 mg/dL 1.23 1.08 1.23  Sodium 135 - 145 mmol/L 140 142 143  Potassium 3.5 - 5.1 mmol/L 4.1 4.6 4.3  Chloride 98 - 111 mmol/L 106 101 102  CO2 22 - 32 mmol/L $RemoveB'27 25 29  'ptEiIGan$ Calcium 8.9 - 10.3 mg/dL 8.6(L) 9.2 9.0  Total Protein 6.0 - 8.5 g/dL - 7.3 -  Total Bilirubin 0.0 - 1.2 mg/dL - 0.4 -  Alkaline Phos 44 - 121 IU/L - 65 -  AST 0 - 40 IU/L - 17 -  ALT 0 - 44 IU/L - 16 -     Lipid Panel Lab Results  Component Value Date   CHOL 147 07/09/2020   HDL 48 07/09/2020   LDLCALC 69 07/09/2020   TRIG 177 (H) 07/09/2020   CHOLHDL 3.1 07/09/2020      Wt Readings from Last 3 Encounters:  01/06/21 188 lb 3.2 oz (85.4 kg)  11/14/20 182 lb (82.6 kg)  07/29/20 185 lb (83.9 kg)     Other studies Reviewed: Additional studies/ records that were reviewed today include: Office notes, hospital records and testing.  ASSESSMENT AND PLAN:  1.  Chronic combined systolic/diastolic CHF - he is asymptomatic and weight is stable - EF at last echo 2020 showed low normal LVF with EF 50-55% with normal right heart - continue prescription drug  management with Entresto 24-$RemoveBeforeDE'26mg'kxPdIHTRqljtxKA$  BID, Toprol XL $RemoveB'25mg'HblpSGwO$  daily and Torsemide $RemoveBefor'20mg'SfIchQbGiMgT$  BID>refilled - I have personally reviewed and interpreted outside labs performed by patient's PCP which showed SCr 1.23, K+ 4.1 in Aug 2022   2. Permanent atrial fibrillation  - s/p BiV PPM - HR controlled with pacing - not on anticoag 2nd hx colon polyps and risk of bleeding, plus age - continue PPM checks as scheduled and EP followup  3.  HTN -BP controlled on exam today -continue Entresto, Toprol  4.  Nonischemic DCM -EF initially 20-25% on echo 2017 -s/p BiV PPM -followed in device clinic  Labs/ tests ordered today include:  No orders of the defined types were placed in this encounter.  Disposition:   FU with Rozann Lesches, MD 6 months  Signed, Fransico Him, MD  01/06/2021 3:27 PM    [place] Phone: [phone]; Fax: [phone]

## 2021-01-06 NOTE — Patient Instructions (Signed)
Medication Instructions:  Your physician recommends that you continue on your current medications as directed. Please refer to the Current Medication list given to you today.  *If you need a refill on your cardiac medications before your next appointment, please call your pharmacy*   Follow-Up: At [facility], you and your health needs are our priority.  As part of our continuing mission to provide you with exceptional heart care, we have created designated Provider Care Teams.  These Care Teams include your primary Cardiologist (physician) and Advanced Practice Providers (APPs -  Physician Assistants and Nurse Practitioners) who all work together to provide you with the care you need, when you need it.    Your next appointment:   6 month(s)  The format for your next appointment:   In Person  Provider:   You may see Rozann Lesches, MD or one of the following Advanced Practice Providers on your designated Care Team:   [place], PA-C  Ermalinda Barrios, [place]

## 2021-01-07 DIAGNOSIS — L570 Actinic keratosis: Secondary | ICD-10-CM | POA: Diagnosis not present

## 2021-01-07 DIAGNOSIS — X32XXXD Exposure to sunlight, subsequent encounter: Secondary | ICD-10-CM | POA: Diagnosis not present

## 2021-01-18 ENCOUNTER — Other Ambulatory Visit: Payer: Self-pay | Admitting: Internal Medicine

## 2021-01-27 ENCOUNTER — Ambulatory Visit: Payer: Medicare Other | Admitting: Urology

## 2021-02-04 DIAGNOSIS — Z23 Encounter for immunization: Secondary | ICD-10-CM | POA: Diagnosis not present

## 2021-02-05 ENCOUNTER — Telehealth: Payer: Self-pay | Admitting: Family Medicine

## 2021-02-05 NOTE — Telephone Encounter (Signed)
No answer unable to leave amessage for patient to call back and schedule Medicare Annual Wellness Visit (AWV) in office.   If unable to come into the office for AWV,  please offer to do virtually or by telephone.  Last AWV: 02/09/2020  Please schedule at anytime with [facility]-Nurse Health Advisor.  40 minute appointment  Any questions, please contact me at [phone]

## 2021-02-17 ENCOUNTER — Other Ambulatory Visit: Payer: Self-pay | Admitting: Cardiology

## 2021-02-18 ENCOUNTER — Ambulatory Visit (INDEPENDENT_AMBULATORY_CARE_PROVIDER_SITE_OTHER): Payer: Medicare Other

## 2021-02-18 DIAGNOSIS — I428 Other cardiomyopathies: Secondary | ICD-10-CM

## 2021-02-18 LAB — CUP PACEART REMOTE DEVICE CHECK
Battery Remaining Longevity: 138 mo
Battery Voltage: 3.12 V
Brady Statistic RA Percent Paced: 0 %
Brady Statistic RV Percent Paced: 97.97 %
Date Time Interrogation Session: 20221115002102
Implantable Lead Implant Date: 20170914
Implantable Lead Implant Date: 20170914
Implantable Lead Implant Date: 20220811
Implantable Lead Location: 753858
Implantable Lead Location: 753859
Implantable Lead Location: 753860
Implantable Lead Model: 3830
Implantable Lead Model: 5076
Implantable Lead Model: 5076
Implantable Pulse Generator Implant Date: 20220811
Lead Channel Impedance Value: 266 Ohm
Lead Channel Impedance Value: 304 Ohm
Lead Channel Impedance Value: 380 Ohm
Lead Channel Impedance Value: 380 Ohm
Lead Channel Impedance Value: 437 Ohm
Lead Channel Impedance Value: 494 Ohm
Lead Channel Impedance Value: 532 Ohm
Lead Channel Impedance Value: 532 Ohm
Lead Channel Impedance Value: 532 Ohm
Lead Channel Pacing Threshold Amplitude: 0.625 V
Lead Channel Pacing Threshold Amplitude: 0.625 V
Lead Channel Pacing Threshold Pulse Width: 0.4 ms
Lead Channel Pacing Threshold Pulse Width: 0.5 ms
Lead Channel Sensing Intrinsic Amplitude: 1.25 mV
Lead Channel Sensing Intrinsic Amplitude: 1.25 mV
Lead Channel Sensing Intrinsic Amplitude: 14.375 mV
Lead Channel Sensing Intrinsic Amplitude: 14.375 mV
Lead Channel Setting Pacing Amplitude: 1.25 V
Lead Channel Setting Pacing Amplitude: 2 V
Lead Channel Setting Pacing Pulse Width: 0.4 ms
Lead Channel Setting Pacing Pulse Width: 0.5 ms
Lead Channel Setting Sensing Sensitivity: 2.8 mV

## 2021-02-22 ENCOUNTER — Other Ambulatory Visit: Payer: Self-pay | Admitting: Family Medicine

## 2021-02-24 NOTE — Telephone Encounter (Signed)
Sent my chart message 02/24/21

## 2021-02-25 ENCOUNTER — Ambulatory Visit (INDEPENDENT_AMBULATORY_CARE_PROVIDER_SITE_OTHER): Payer: Medicare Other | Admitting: Internal Medicine

## 2021-02-25 ENCOUNTER — Encounter: Payer: Self-pay | Admitting: Internal Medicine

## 2021-02-25 ENCOUNTER — Other Ambulatory Visit: Payer: Self-pay

## 2021-02-25 VITALS — BP 110/60 | HR 78 | Ht 74.0 in | Wt 193.8 lb

## 2021-02-25 DIAGNOSIS — I25118 Atherosclerotic heart disease of native coronary artery with other forms of angina pectoris: Secondary | ICD-10-CM | POA: Diagnosis not present

## 2021-02-25 DIAGNOSIS — I442 Atrioventricular block, complete: Secondary | ICD-10-CM | POA: Diagnosis not present

## 2021-02-25 LAB — CUP PACEART INCLINIC DEVICE CHECK
Battery Remaining Longevity: 140 mo
Battery Voltage: 3.12 V
Brady Statistic RA Percent Paced: 0 %
Brady Statistic RV Percent Paced: 97.91 %
Date Time Interrogation Session: 20221122131042
Implantable Lead Implant Date: 20170914
Implantable Lead Implant Date: 20170914
Implantable Lead Implant Date: 20220811
Implantable Lead Location: 753858
Implantable Lead Location: 753859
Implantable Lead Location: 753860
Implantable Lead Model: 3830
Implantable Lead Model: 5076
Implantable Lead Model: 5076
Implantable Pulse Generator Implant Date: 20220811
Lead Channel Impedance Value: 247 Ohm
Lead Channel Impedance Value: 285 Ohm
Lead Channel Impedance Value: 361 Ohm
Lead Channel Impedance Value: 361 Ohm
Lead Channel Impedance Value: 418 Ohm
Lead Channel Impedance Value: 494 Ohm
Lead Channel Impedance Value: 532 Ohm
Lead Channel Impedance Value: 532 Ohm
Lead Channel Impedance Value: 532 Ohm
Lead Channel Pacing Threshold Amplitude: 0.5 V
Lead Channel Pacing Threshold Amplitude: 0.5 V
Lead Channel Pacing Threshold Pulse Width: 0.4 ms
Lead Channel Pacing Threshold Pulse Width: 0.5 ms
Lead Channel Sensing Intrinsic Amplitude: 1.625 mV
Lead Channel Sensing Intrinsic Amplitude: 14.375 mV
Lead Channel Sensing Intrinsic Amplitude: 14.375 mV
Lead Channel Sensing Intrinsic Amplitude: 2.625 mV
Lead Channel Setting Pacing Amplitude: 1 V
Lead Channel Setting Pacing Amplitude: 2 V
Lead Channel Setting Pacing Pulse Width: 0.4 ms
Lead Channel Setting Pacing Pulse Width: 0.5 ms
Lead Channel Setting Sensing Sensitivity: 2.8 mV

## 2021-02-25 NOTE — Patient Instructions (Signed)
Medication Instructions:  Your physician recommends that you continue on your current medications as directed. Please refer to the Current Medication list given to you today.  *If you need a refill on your cardiac medications before your next appointment, please call your pharmacy*   Lab Work: NONE   If you have labs (blood work) drawn today and your tests are completely normal, you will receive your results only by: . MyChart Message (if you have MyChart) OR . A paper copy in the mail If you have any lab test that is abnormal or we need to change your treatment, we will call you to review the results.   Testing/Procedures: NONE    Follow-Up: At [facility], you and your health needs are our priority.  As part of our continuing mission to provide you with exceptional heart care, we have created designated Provider Care Teams.  These Care Teams include your primary Cardiologist (physician) and Advanced Practice Providers (APPs -  Physician Assistants and Nurse Practitioners) who all work together to provide you with the care you need, when you need it.  We recommend signing up for the patient portal called "MyChart".  Sign up information is provided on this After Visit Summary.  MyChart is used to connect with patients for Virtual Visits (Telemedicine).  Patients are able to view lab/test results, encounter notes, upcoming appointments, etc.  Non-urgent messages can be sent to your provider as well.   To learn more about what you can do with MyChart, go to [email or web address].    Your next appointment:   1 year(s)  The format for your next appointment:   In Person  Provider:   Gregg Taylor, MD   Other Instructions Thank you for choosing [facility]!    

## 2021-02-25 NOTE — Progress Notes (Signed)
HPI  Allergies  Allergen Reactions   Bee Venom Anaphylaxis    Honey bee      Current Outpatient Medications  Medication Sig Dispense Refill   allopurinol (ZYLOPRIM) 100 MG tablet 2 qd for gout prevention 60 tablet 5   blood glucose meter kit and supplies Dispense based on patient and insurance preference. Use to check blood sugar once daily as directed. For E11.9. 1 each 5   clobetasol cream (TEMOVATE) 8.46 % Apply 1 application topically 2 (two) times daily as needed (burns on hands).     docusate sodium (COLACE) 100 MG capsule Take 100 mg by mouth daily.     ENTRESTO 24-26 MG TAKE ONE TABLET BY MOUTH 2 TIMES A DAY. 60 tablet 6   finasteride (PROSCAR) 5 MG tablet TAKE ONE TABLET BY MOUTH ONCE DAILY 90 tablet 3   metFORMIN (GLUCOPHAGE) 500 MG tablet TAKE ONE TABLET BY MOUTH DAILY. PLEASE CONTACT OFFICE FOR SUMMER APPOINTMENT 90 tablet 0   metoprolol succinate (TOPROL-XL) 25 MG 24 hr tablet TAKE 1 TABLET BY MOUTH TWICE DAILY. 60 tablet 11   nitrofurantoin (MACRODANTIN) 50 MG capsule Take 1 capsule (50 mg total) by mouth at bedtime. 30 capsule 5   nitrofurantoin, macrocrystal-monohydrate, (MACROBID) 100 MG capsule Take 1 capsule (100 mg total) by mouth every 12 (twelve) hours. 14 capsule 0   potassium chloride (KLOR-CON) 10 MEQ tablet Take 10 mEq by mouth daily.     silodosin (RAPAFLO) 8 MG CAPS capsule Take 1 capsule (8 mg total) by mouth in the morning and at bedtime. 60 capsule 11   simvastatin (ZOCOR) 40 MG tablet TAKE ONE TABLET BY MOUTH EVERY DAY 90 tablet 0   torsemide (DEMADEX) 20 MG tablet TAKE 1 TABLET BY MOUTH TWICE A DAY. (Patient taking differently: Take 20 mg by mouth 2 (two) times daily.) 180 tablet 3   No current facility-administered medications for this visit.     Past Medical History:  Diagnosis Date   Arthritis    "right knee" (12/19/2015)   Chronic combined systolic (congestive) and diastolic (congestive) heart failure (HCC)    EF 50-55% in 2020 echo    Complete heart block (HCC)    S/P BiVPPM   History of colon polyps    History of gout    Hyperlipidemia    Hypertension    Myocardial infarction [facility])    "they said I'd had a heart attack but didn't know it" (12/19/2015).  No CAD on cath   Seasonal allergies    Skin cancer    "arms/hands" (12/19/2015)   Sleep apnea    supposed to wear CPAP, but doesn't (12/19/2015)   Type II diabetes mellitus ([place])     ROS:   All systems reviewed and negative except as noted in the HPI.   Past Surgical History:  Procedure Laterality Date   BIV PACEMAKER GENERATOR CHANGEOUT N/A 11/14/2020   Procedure: BIV PACEMAKER GENERATOR CHANGEOUT;  Surgeon: Evans Lance, MD;  Location: [place] [facility];  Service: Cardiovascular;  Laterality: N/A;   CATARACT EXTRACTION W/PHACO Right 09/08/2012   Procedure: CATARACT EXTRACTION PHACO AND INTRAOCULAR LENS PLACEMENT (IOC);  Surgeon: Tonny Branch, MD;  Location: [facility];  Service: Ophthalmology;  Laterality: Right;  CDE: 21.28   CATARACT EXTRACTION W/PHACO Left 12/08/2012   Procedure: CATARACT EXTRACTION PHACO AND INTRAOCULAR LENS PLACEMENT (IOC);  Surgeon: Tonny Branch, MD;  Location: [facility];  Service: Ophthalmology;  Laterality: Left;  CDE:25.72   [place]  COLONOSCOPY     4 procedures   COLONOSCOPY  2012   Dr. Sharlett Iles: diverticulosis, lipoma in ascending colon.    CYSTOSCOPY WITH FULGERATION N/A 08/11/2017   Procedure: CYSTOSCOPY WITH FULGERATION OF PROSTATIC VARICES;  Surgeon: Cleon Gustin, MD;  Location: [facility];  Service: Urology;  Laterality: N/A;   EP IMPLANTABLE DEVICE N/A 12/19/2015   Procedure: BiV Pacemaker Insertion CRT-P;  Surgeon: Evans Lance, MD;  Location: [place] [facility];  Service: Cardiovascular;  Laterality: N/A;   INSERT / REPLACE / REMOVE PACEMAKER  12/19/2015   JOINT REPLACEMENT Left    knee   LEAD REVISION/REPAIR N/A 11/14/2020   Procedure: LEAD REVISION/REPAIR;  Surgeon: Evans Lance, MD;  Location: [place]  [facility];  Service: Cardiovascular;  Laterality: N/A;   POLYPECTOMY     TONSILLECTOMY     TOTAL KNEE ARTHROPLASTY Left 2000s   TRANSURETHRAL RESECTION OF PROSTATE     YAG LASER APPLICATION Right 0/10/6224   Procedure: YAG LASER APPLICATION;  Surgeon: [name], MD;  Location: [facility];  Service: Ophthalmology;  Laterality: Right;   YAG LASER APPLICATION Left 3/33/5456   Procedure: YAG LASER APPLICATION;  Surgeon: [name], MD;  Location: [facility];  Service: Ophthalmology;  Laterality: Left;     Family History  Problem Relation Age of Onset   Lung cancer Father    Colon cancer Neg Hx      Social History   Socioeconomic History   Marital status: Widowed    Spouse name: Not on file   Number of children: Not on file   Years of education: Not on file   Highest education level: Not on file  Occupational History   Not on file  Tobacco Use   Smoking status: Never   Smokeless tobacco: Never  Vaping Use   Vaping Use: Never used  Substance and Sexual Activity   Alcohol use: Not Currently    Alcohol/week: 2.0 standard drinks    Types: 2 Glasses of wine per week   Drug use: No   Sexual activity: Not Currently    Birth control/protection: None  Other Topics Concern   Not on file  Social History Narrative   Not on file   Social Determinants of Health   Financial Resource Strain: Not on file  Food Insecurity: Not on file  Transportation Needs: Not on file  Physical Activity: Not on file  Stress: Not on file  Social Connections: Not on file  Intimate Partner Violence: Not on file     BP 110/60   Pulse 78   Ht $R'6\' 2"'QN$  (1.88 m)   Wt 193 lb 12.8 oz (87.9 kg)   SpO2 98%   BMI 24.88 kg/m   Physical Exam:  Well appearing NAD HEENT: Unremarkable Neck:  No JVD, no thyromegally Lymphatics:  No adenopathy Back:  No CVA tenderness Lungs:  Clear with no wheezes HEART:  Regular rate rhythm, no murmurs, no rubs, no clicks Abd:  soft, positive bowel sounds, no organomegally,  no rebound, no guarding Ext:  2 plus pulses, no edema, no cyanosis, no clubbing Skin:  No rashes no nodules Neuro:  CN II through XII intact, motor grossly intact  DEVICE  Normal device function.  See PaceArt for details.   Assess/Plan:  Atrial fib - his VR is well controlled. No change in meds. CHB - he is s/p PPM with gen change and additional left bundle area lead in place. He is asymptomatic. Chronic systolic and diastolic CHF -  he has class 2 symptoms. He is limited by his arthritic knee. PPM -his medtronic biv PPM is programmed VVIR and his RV and LV thresholds look good.   Carleene Overlie Anneth Brunell,MD

## 2021-02-26 NOTE — Progress Notes (Signed)
Remote pacemaker transmission.   

## 2021-03-04 DIAGNOSIS — B351 Tinea unguium: Secondary | ICD-10-CM | POA: Diagnosis not present

## 2021-03-04 DIAGNOSIS — E1142 Type 2 diabetes mellitus with diabetic polyneuropathy: Secondary | ICD-10-CM | POA: Diagnosis not present

## 2021-03-24 NOTE — Telephone Encounter (Signed)
Second request 03/24/21

## 2021-04-08 ENCOUNTER — Other Ambulatory Visit: Payer: Self-pay | Admitting: Physician Assistant

## 2021-04-09 NOTE — Telephone Encounter (Signed)
Third request to schedule appointment

## 2021-04-09 NOTE — Telephone Encounter (Signed)
This is a [place] pt.  °

## 2021-04-12 ENCOUNTER — Other Ambulatory Visit: Payer: Self-pay

## 2021-04-12 ENCOUNTER — Encounter (HOSPITAL_BASED_OUTPATIENT_CLINIC_OR_DEPARTMENT_OTHER): Payer: Self-pay | Admitting: *Deleted

## 2021-04-12 ENCOUNTER — Emergency Department (HOSPITAL_BASED_OUTPATIENT_CLINIC_OR_DEPARTMENT_OTHER)
Admission: EM | Admit: 2021-04-12 | Discharge: 2021-04-12 | Disposition: A | Discharge status: Home / Self Care | Payer: Medicare Other | Attending: Emergency Medicine | Admitting: Emergency Medicine

## 2021-04-12 ENCOUNTER — Telehealth: Payer: Medicare Other | Admitting: Emergency Medicine

## 2021-04-12 DIAGNOSIS — Z79899 Other long term (current) drug therapy: Secondary | ICD-10-CM | POA: Diagnosis not present

## 2021-04-12 DIAGNOSIS — U071 COVID-19: Secondary | ICD-10-CM | POA: Insufficient documentation

## 2021-04-12 DIAGNOSIS — Z7984 Long term (current) use of oral hypoglycemic drugs: Secondary | ICD-10-CM | POA: Diagnosis not present

## 2021-04-12 DIAGNOSIS — Z950 Presence of cardiac pacemaker: Secondary | ICD-10-CM | POA: Diagnosis not present

## 2021-04-12 DIAGNOSIS — I110 Hypertensive heart disease with heart failure: Secondary | ICD-10-CM | POA: Diagnosis not present

## 2021-04-12 DIAGNOSIS — R059 Cough, unspecified: Secondary | ICD-10-CM | POA: Diagnosis present

## 2021-04-12 DIAGNOSIS — E119 Type 2 diabetes mellitus without complications: Secondary | ICD-10-CM | POA: Insufficient documentation

## 2021-04-12 DIAGNOSIS — I509 Heart failure, unspecified: Secondary | ICD-10-CM | POA: Insufficient documentation

## 2021-04-12 LAB — RESP PANEL BY RT-PCR (FLU A&B, COVID) ARPGX2
Influenza A by PCR: NEGATIVE
Influenza B by PCR: NEGATIVE
SARS Coronavirus 2 by RT PCR: POSITIVE — AB

## 2021-04-12 LAB — BASIC METABOLIC PANEL
Anion gap: 8 (ref 5–15)
BUN: 16 mg/dL (ref 8–23)
CO2: 30 mmol/L (ref 22–32)
Calcium: 8.8 mg/dL — ABNORMAL LOW (ref 8.9–10.3)
Chloride: 102 mmol/L (ref 98–111)
Creatinine, Ser: 1.27 mg/dL — ABNORMAL HIGH (ref 0.61–1.24)
GFR, Estimated: 54 mL/min — ABNORMAL LOW (ref >60)
Glucose, Bld: 122 mg/dL — ABNORMAL HIGH (ref 70–99)
Potassium: 3.8 mmol/L (ref 3.5–5.1)
Sodium: 140 mmol/L (ref 135–145)

## 2021-04-12 MED ORDER — MOLNUPIRAVIR EUA 200MG CAPSULE: 4.0000 | ORAL_CAPSULE | Freq: Two times a day (BID) | ORAL | 0 refills | Status: AC

## 2021-04-12 MED ORDER — NIRMATRELVIR/RITONAVIR (PAXLOVID) TABLET (RENAL DOSING): 2.0000 | ORAL_TABLET | Freq: Two times a day (BID) | ORAL | 0 refills | Status: AC

## 2021-04-12 MED ORDER — NIRMATRELVIR/RITONAVIR (PAXLOVID) TABLET (RENAL DOSING): 2.0000 | ORAL_TABLET | Freq: Two times a day (BID) | ORAL | 0 refills | Status: DC

## 2021-04-12 NOTE — ED Notes (Signed)
Medication sent to [facility] as his pharmacy was out of this medication.  Pt left ambulatory with family member, no distress noted.

## 2021-04-12 NOTE — ED Triage Notes (Addendum)
Covid positive at home. Family all have covid. C/o cough and URI sx. Onset last night. Always sob, not worse. Denies fever, NVD. PCP tele visit was not able to prescribe paxlovid d/t him needing labwork. Here for lab work in order to get paxlovid. Wants information on MAB tx. Pt alert, NAD, calm, interactive. Denies pain.

## 2021-04-12 NOTE — Patient Instructions (Signed)
Anthony Lucas, thank you for joining [name], PA-C for today's virtual visit.  While this provider is not your primary care provider (PCP), if your PCP is located in our provider database this encounter information will be shared with them immediately following your visit.  Consent: (Patient) Anthony Lucas provided verbal consent for this virtual visit at the beginning of the encounter.  Current Medications:  Current Outpatient Medications:    molnupiravir EUA (LAGEVRIO) 200 mg CAPS capsule, Take 4 capsules (800 mg total) by mouth 2 (two) times daily for 5 days., Disp: 40 capsule, Rfl: 0   potassium chloride (KLOR-CON) 10 MEQ tablet, TAKE (1) TABLET BY MOUTH ONCE DAILY., Disp: 90 tablet, Rfl: 3   allopurinol (ZYLOPRIM) 100 MG tablet, 2 qd for gout prevention, Disp: 60 tablet, Rfl: 5   blood glucose meter kit and supplies, Dispense based on patient and insurance preference. Use to check blood sugar once daily as directed. For E11.9., Disp: 1 each, Rfl: 5   clobetasol cream (TEMOVATE) 4.08 %, Apply 1 application topically 2 (two) times daily as needed (burns on hands)., Disp: , Rfl:    docusate sodium (COLACE) 100 MG capsule, Take 100 mg by mouth daily., Disp: , Rfl:    ENTRESTO 24-26 MG, TAKE ONE TABLET BY MOUTH 2 TIMES A DAY., Disp: 60 tablet, Rfl: 6   finasteride (PROSCAR) 5 MG tablet, TAKE ONE TABLET BY MOUTH ONCE DAILY, Disp: 90 tablet, Rfl: 3   metFORMIN (GLUCOPHAGE) 500 MG tablet, TAKE ONE TABLET BY MOUTH DAILY. PLEASE CONTACT OFFICE FOR SUMMER APPOINTMENT, Disp: 90 tablet, Rfl: 0   metoprolol succinate (TOPROL-XL) 25 MG 24 hr tablet, TAKE 1 TABLET BY MOUTH TWICE DAILY., Disp: 60 tablet, Rfl: 11   nitrofurantoin (MACRODANTIN) 50 MG capsule, Take 1 capsule (50 mg total) by mouth at bedtime., Disp: 30 capsule, Rfl: 5   nitrofurantoin, macrocrystal-monohydrate, (MACROBID) 100 MG capsule, Take 1 capsule (100 mg total) by mouth every 12 (twelve) hours., Disp: 14 capsule, Rfl: 0   potassium  chloride (KLOR-CON) 10 MEQ tablet, Take 10 mEq by mouth daily., Disp: , Rfl:    silodosin (RAPAFLO) 8 MG CAPS capsule, Take 1 capsule (8 mg total) by mouth in the morning and at bedtime., Disp: 60 capsule, Rfl: 11   simvastatin (ZOCOR) 40 MG tablet, TAKE ONE TABLET BY MOUTH EVERY DAY, Disp: 90 tablet, Rfl: 0   torsemide (DEMADEX) 20 MG tablet, TAKE 1 TABLET BY MOUTH TWICE A DAY. (Patient taking differently: Take 20 mg by mouth 2 (two) times daily.), Disp: 180 tablet, Rfl: 3   Medications ordered in this encounter:  Meds ordered this encounter  Medications   molnupiravir EUA (LAGEVRIO) 200 mg CAPS capsule    Sig: Take 4 capsules (800 mg total) by mouth 2 (two) times daily for 5 days.    Dispense:  40 capsule    Refill:  0    Order Specific Question:   Supervising Provider    Answer:   Sabra Heck, [place]     *If you need refills on other medications prior to your next appointment, please contact your pharmacy*  Follow-Up: Call back or seek an in-person evaluation if the symptoms worsen or if the condition fails to improve as anticipated.  Other Instructions COVID test was positive You should remain isolated in your home for 5 days from symptom onset AND greater than 72 hours after symptoms resolution (absence of fever without the use of fever-reducing medication and improvement in respiratory symptoms), whichever  is longer Get plenty of rest and push fluids Patient does not have recent blood work on file, but is a candidate for antiviral treatment.  Molnupiravir prescribed.  Take as directed and to completion Use zyrtec for nasal congestion, runny nose, and/or sore throat Use flonase for nasal congestion and runny nose Use medications daily for symptom relief Use OTC medications like ibuprofen or tylenol as needed fever or pain Follow up with PCP in 1-2 days via phone or e-visit for recheck and to ensure symptoms are improving Call or go to the ED if you have any new or worsening  symptoms such as fever, worsening cough, shortness of breath, chest tightness, chest pain, turning blue, changes in mental status, etc...     If you have been instructed to have an in-person evaluation today at a local Urgent Care facility, please use the link below. It will take you to a list of all of our available [facility], including address, phone number and hours of operation. Please do not delay care.  [facility]  If you or a family member do not have a primary care provider, use the link below to schedule a visit and establish care. When you choose a [facility] primary care physician or advanced practice provider, you gain a long-term partner in health. Find a Primary Care Provider  Learn more about [facility] in-office and virtual care options: [facility]

## 2021-04-12 NOTE — ED Notes (Signed)
Pt is here as several family members are covid positive and he had a tele health visit this am after he was found to be positive.  Family desires to be proactive and in order for him to have the best antiviral treatment he was told that he needed blood work.  Pt does not have any increased sob.  Pt is in no acute distress, attentive family member at the bedside.

## 2021-04-12 NOTE — Discharge Instructions (Addendum)
You need to stop the nitrofurantoin and the simvastatin while you are taking the medication.  You can resume it when you are finished with the medication.

## 2021-04-12 NOTE — Progress Notes (Signed)
Virtual Visit Consent   [name], you are scheduled for a virtual visit with a [facility] provider today.     Just as with appointments in the office, your consent must be obtained to participate.  Your consent will be active for this visit and any virtual visit you may have with one of our providers in the next 365 days.     If you have a MyChart account, a copy of this consent can be sent to you electronically.  All virtual visits are billed to your insurance company just like a traditional visit in the office.    As this is a virtual visit, video technology does not allow for your provider to perform a traditional examination.  This may limit your provider's ability to fully assess your condition.  If your provider identifies any concerns that need to be evaluated in person or the need to arrange testing (such as labs, EKG, etc.), we will make arrangements to do so.     Although advances in technology are sophisticated, we cannot ensure that it will always work on either your end or our end.  If the connection with a video visit is poor, the visit may have to be switched to a telephone visit.  With either a video or telephone visit, we are not always able to ensure that we have a secure connection.     I need to obtain your verbal consent now.   Are you willing to proceed with your visit today? Yes   Anthony Lucas has provided verbal consent on 04/12/2021 for a virtual visit (video or telephone).   [name], [place]   Date: 04/12/2021 12:32 PM   Virtual Visit via Video Note   I, [name], connected with  Anthony Lucas  ([id number], [id number]) on 04/12/21 at 12:15 PM EST by a video-enabled telemedicine application and verified that I am speaking with the correct person using two identifiers.  Location: Patient: Virtual Visit Location Patient: Home Provider: Virtual Visit Location Provider: Home Office   I discussed the limitations of evaluation and management by telemedicine  and the availability of in person appointments. The patient expressed understanding and agreed to proceed.    History of Present Illness: HPI obtained from granddaughter given patient's consent Anthony Lucas is a 88 y.o. who identifies as a male who was assigned male at birth, and is being seen today for runny nose, congestion, fatigue, and cough x 1 day.  Admits to covid exposure.  Tested positive for covid.  Has NOT tried OTC medication  Denies aggravating factors.  Denies previous covid infection in the past.   Denies fever, chills, SOB, wheezing, chest pain, nausea, changes in bowel or bladder habits.     ROS: As per HPI.  All other pertinent ROS negative.     HPI: HPI  Problems:  Patient Active Problem List   Diagnosis Date Noted   Coronary artery disease of native artery of native heart with stable angina pectoris ([place]) 07/09/2020   Hyperlipidemia associated with type 2 diabetes mellitus ([place]) 02/10/2020   Chronic cystitis with hematuria 12/27/2019   Incomplete emptying of bladder 54/00/8676   Chronic systolic heart failure ([place]) 12/19/2015   Rectal bleeding 09/11/2015   Left ventricular dysfunction 04/25/2015   CHF (congestive heart failure) ([place]) 04/25/2015   Gout 01/18/2014   Hyperlipidemia 01/18/2014   Squamous cell carcinoma 06/06/2013   Diffuse photodamage of skin 06/06/2013   Special screening for malignant neoplasms, colon 11/26/2010  Diverticulosis of colon (without mention of hemorrhage) 11/26/2010   Lipoma of colon 11/26/2010   DYSPNEA 11/29/2008   INTERSTITIAL LUNG DISEASE 11/20/2008   PROSTATE CANCER 11/09/2008   Type 2 diabetes mellitus, controlled ([place]) 11/09/2008   Essential hypertension 11/09/2008   OSTEOARTHRITIS 11/09/2008   BPH (benign prostatic hyperplasia) 11/09/2008   KNEE REPLACEMENT, LEFT, HX OF 11/09/2008    Allergies:  Allergies  Allergen Reactions   Bee Venom Anaphylaxis    Honey bee    Medications:  Current Outpatient Medications:     molnupiravir EUA (LAGEVRIO) 200 mg CAPS capsule, Take 4 capsules (800 mg total) by mouth 2 (two) times daily for 5 days., Disp: 40 capsule, Rfl: 0   potassium chloride (KLOR-CON) 10 MEQ tablet, TAKE (1) TABLET BY MOUTH ONCE DAILY., Disp: 90 tablet, Rfl: 3   allopurinol (ZYLOPRIM) 100 MG tablet, 2 qd for gout prevention, Disp: 60 tablet, Rfl: 5   blood glucose meter kit and supplies, Dispense based on patient and insurance preference. Use to check blood sugar once daily as directed. For E11.9., Disp: 1 each, Rfl: 5   clobetasol cream (TEMOVATE) 2.33 %, Apply 1 application topically 2 (two) times daily as needed (burns on hands)., Disp: , Rfl:    docusate sodium (COLACE) 100 MG capsule, Take 100 mg by mouth daily., Disp: , Rfl:    ENTRESTO 24-26 MG, TAKE ONE TABLET BY MOUTH 2 TIMES A DAY., Disp: 60 tablet, Rfl: 6   finasteride (PROSCAR) 5 MG tablet, TAKE ONE TABLET BY MOUTH ONCE DAILY, Disp: 90 tablet, Rfl: 3   metFORMIN (GLUCOPHAGE) 500 MG tablet, TAKE ONE TABLET BY MOUTH DAILY. PLEASE CONTACT OFFICE FOR SUMMER APPOINTMENT, Disp: 90 tablet, Rfl: 0   metoprolol succinate (TOPROL-XL) 25 MG 24 hr tablet, TAKE 1 TABLET BY MOUTH TWICE DAILY., Disp: 60 tablet, Rfl: 11   nitrofurantoin (MACRODANTIN) 50 MG capsule, Take 1 capsule (50 mg total) by mouth at bedtime., Disp: 30 capsule, Rfl: 5   nitrofurantoin, macrocrystal-monohydrate, (MACROBID) 100 MG capsule, Take 1 capsule (100 mg total) by mouth every 12 (twelve) hours., Disp: 14 capsule, Rfl: 0   potassium chloride (KLOR-CON) 10 MEQ tablet, Take 10 mEq by mouth daily., Disp: , Rfl:    silodosin (RAPAFLO) 8 MG CAPS capsule, Take 1 capsule (8 mg total) by mouth in the morning and at bedtime., Disp: 60 capsule, Rfl: 11   simvastatin (ZOCOR) 40 MG tablet, TAKE ONE TABLET BY MOUTH EVERY DAY, Disp: 90 tablet, Rfl: 0   torsemide (DEMADEX) 20 MG tablet, TAKE 1 TABLET BY MOUTH TWICE A DAY. (Patient taking differently: Take 20 mg by mouth 2 (two) times daily.), Disp:  180 tablet, Rfl: 3  Observations/Objective: Patient is well-developed, well-nourished in no acute distress.  Resting comfortably at home. Mildly fatigued, but nontoxic Head is normocephalic, atraumatic.  No labored breathing.  Speech is clear and coherent with logical content.  Patient is alert and oriented at baseline.    Assessment and Plan: 1. COVID-19 virus infection   COVID test was positive You should remain isolated in your home for 5 days from symptom onset AND greater than 72 hours after symptoms resolution (absence of fever without the use of fever-reducing medication and improvement in respiratory symptoms), whichever is longer Get plenty of rest and push fluids Patient does not have recent blood work on file, but is a candidate for antiviral treatment.  Molnupiravir prescribed.  Take as directed and to completion Use zyrtec for nasal congestion, runny nose, and/or sore throat Use  flonase for nasal congestion and runny nose Use medications daily for symptom relief Use OTC medications like ibuprofen or tylenol as needed fever or pain Follow up with PCP in 1-2 days via phone or e-visit for recheck and to ensure symptoms are improving Call or go to the ED if you have any new or worsening symptoms such as fever, worsening cough, shortness of breath, chest tightness, chest pain, turning blue, changes in mental status, etc...    Follow Up Instructions: I discussed the assessment and treatment plan with the patient. The patient was provided an opportunity to ask questions and all were answered. The patient agreed with the plan and demonstrated an understanding of the instructions.  A copy of instructions were sent to the patient via MyChart unless otherwise noted below.    The patient was advised to call back or seek an in-person evaluation if the symptoms worsen or if the condition fails to improve as anticipated.  Time:  I spent 15-20 minutes with the patient via telehealth  technology discussing the above problems/concerns.    [name], PA-C

## 2021-04-12 NOTE — ED Provider Notes (Signed)
[facility] EMERGENCY DEPT Provider Note   CSN: [id number] Arrival date & time: 04/12/21  1316     History  Chief Complaint  Patient presents with   Cough    Anthony Lucas is a 88 y.o. male.  Patient is an 88 year old male with a history of hypertension, CHF, diabetes, gout, MI, complete heart block status post pacemaker who is presenting today for evaluation.  Patient's family has 5 people in the home that are caught positive for COVID and today he woke up with some sneezing and mild congestion.  His family tested him and he tested COVID-positive today.  When asking the patient he said that he does not even really feel sick just some slight runny nose.  He is denying any chest pain, shortness of breath, myalgias, fever or fatigue.  He has been eating and drinking normally.  They had a televisit today with his doctor and they had prescribed mulnipiravir but reported they would prefer him to have paxlovid but he would need to come to the emergency room to get his kidney function checked.  Patient has received all of the COVID boosters and this is the first time that he knows of that he has had COVID symptoms.  The history is provided by the patient and a relative.  Cough     Home Medications Prior to Admission medications   Medication Sig Start Date End Date Taking? Authorizing Provider  allopurinol (ZYLOPRIM) 100 MG tablet 2 qd for gout prevention 07/09/20  Yes Luking, Scott A, MD  docusate sodium (COLACE) 100 MG capsule Take 100 mg by mouth daily.   Yes [provider]  ENTRESTO 24-26 MG TAKE ONE TABLET BY MOUTH 2 TIMES A DAY. 02/17/21  Yes Arnoldo Lenis, MD  finasteride (PROSCAR) 5 MG tablet TAKE ONE TABLET BY MOUTH ONCE DAILY 12/20/20  Yes McKenzie, Candee Furbish, MD  metFORMIN (GLUCOPHAGE) 500 MG tablet TAKE ONE TABLET BY MOUTH DAILY. PLEASE CONTACT OFFICE FOR SUMMER APPOINTMENT 10/21/20  Yes Kathyrn Drown, MD  metoprolol succinate (TOPROL-XL) 25 MG 24 hr  tablet TAKE 1 TABLET BY MOUTH TWICE DAILY. 01/20/21  Yes Evans Lance, MD  nirmatrelvir/ritonavir EUA, renal dosing, (PAXLOVID) 10 x 150 MG & 10 x $Re'100MG'lji$  TABS Take 2 tablets by mouth 2 (two) times daily for 5 days. Patient GFR is 54. Take nirmatrelvir (150 mg) one tablet twice daily for 5 days and ritonavir (100 mg) one tablet twice daily for 5 days. 04/12/21 04/17/21 Yes Latiesha Harada, Loree Fee, MD  nitrofurantoin (MACRODANTIN) 50 MG capsule Take 1 capsule (50 mg total) by mouth at bedtime. 12/23/20  Yes McKenzie, Candee Furbish, MD  potassium chloride (KLOR-CON) 10 MEQ tablet Take 10 mEq by mouth daily.   Yes [provider]  potassium chloride (KLOR-CON) 10 MEQ tablet TAKE (1) TABLET BY MOUTH ONCE DAILY. 04/09/21   Sueanne Margarita, MD  silodosin (RAPAFLO) 8 MG CAPS capsule Take 1 capsule (8 mg total) by mouth in the morning and at bedtime. 12/20/20  Yes McKenzie, Candee Furbish, MD  simvastatin (ZOCOR) 40 MG tablet TAKE ONE TABLET BY MOUTH EVERY DAY 02/24/21  Yes Luking, Scott A, MD  torsemide (DEMADEX) 20 MG tablet TAKE 1 TABLET BY MOUTH TWICE A DAY. Patient taking differently: Take 20 mg by mouth 2 (two) times daily. 07/15/20  Yes Branch, Alphonse Guild, MD  blood glucose meter kit and supplies Dispense based on patient and insurance preference. Use to check blood sugar once daily as directed. For E11.9.  05/08/19   Kathyrn Drown, MD  clobetasol cream (TEMOVATE) 1.61 % Apply 1 application topically 2 (two) times daily as needed (burns on hands). 05/30/19   [provider]  molnupiravir EUA (LAGEVRIO) 200 mg CAPS capsule Take 4 capsules (800 mg total) by mouth 2 (two) times daily for 5 days. 04/12/21 04/17/21  Wurst, [place], PA-C  nitrofurantoin, macrocrystal-monohydrate, (MACROBID) 100 MG capsule Take 1 capsule (100 mg total) by mouth every 12 (twelve) hours. 12/23/20   McKenzie, Candee Furbish, MD      Allergies    Bee venom    Review of Systems   Review of Systems  Respiratory:  Positive for cough.     Physical Exam Updated Vital Signs BP 128/80 (BP Location: Right Arm)    Pulse 60    Temp 98.5 F (36.9 C) (Oral)    Resp 20    Ht $R'6\' 2"'QS$  (1.88 m)    Wt 86.2 kg    SpO2 100%    BMI 24.39 kg/m  Physical Exam Vitals and nursing note reviewed.  Constitutional:      General: He is not in acute distress.    Appearance: He is well-developed.  HENT:     Head: Normocephalic and atraumatic.  Eyes:     Conjunctiva/sclera: Conjunctivae normal.     Pupils: Pupils are equal, round, and reactive to light.  Cardiovascular:     Rate and Rhythm: Normal rate and regular rhythm.     Heart sounds: No murmur heard. Pulmonary:     Effort: Pulmonary effort is normal. No respiratory distress.     Breath sounds: Normal breath sounds. No wheezing or rales.  Abdominal:     General: There is no distension.     Palpations: Abdomen is soft.     Tenderness: There is no abdominal tenderness. There is no guarding or rebound.  Musculoskeletal:        General: No tenderness. Normal range of motion.     Cervical back: Normal range of motion and neck supple.  Skin:    General: Skin is warm and dry.     Findings: No erythema or rash.  Neurological:     Mental Status: He is alert and oriented to person, place, and time.  Psychiatric:        Behavior: Behavior normal.    ED Results / Procedures / Treatments   Labs (all labs ordered are listed, but only abnormal results are displayed) Labs Reviewed  RESP PANEL BY RT-PCR (FLU A&B, COVID) ARPGX2 - Abnormal; Notable for the following components:      Result Value   SARS Coronavirus 2 by RT PCR POSITIVE (*)    All other components within normal limits  BASIC METABOLIC PANEL - Abnormal; Notable for the following components:   Glucose, Bld 122 (*)    Creatinine, Ser 1.27 (*)    Calcium 8.8 (*)    GFR, Estimated 54 (*)    All other components within normal limits    EKG None  Radiology No results found.  Procedures Procedures    Medications Ordered  in ED Medications - No data to display  ED Course/ Medical Decision Making/ A&P                           Medical Decision Making  Patient is an elderly gentleman presenting today COVID-positive day 1.  He has no specific complaints and is well-appearing on exam.  He had a  televisit already today and they did prescribe him Mulnipiravir but reported they prefer him have paxlovid but he would need to have labs checked.  Patient has been vaccinated.  He is a candidate given his other medical problems.  His daughter is working on getting a accurate medication list to see if he would be a candidate.  BMP ordered to review creatinine.  5:16 PM I independently interpreted the patient's labs and his GFR is 54.  Spoke with the pharmacist and patient is a candidate for reduced dose paxlovid.  He will have to discontinue his simvastatin at this time and if he is still taking the [place] he will need to discontinue that as well.  Otherwise he can continue his other medications.  Findings were discussed with the patient and his family member who is present.        Final Clinical Impression(s) / ED Diagnoses Final diagnoses:  COVID    Rx / DC Orders ED Discharge Orders          Ordered    nirmatrelvir/ritonavir EUA, renal dosing, (PAXLOVID) 10 x 150 MG & 10 x $Re'100MG'dQL$  TABS  2 times daily        04/12/21 1716              Blanchie Dessert, MD 04/12/21 1717

## 2021-04-19 ENCOUNTER — Other Ambulatory Visit: Payer: Self-pay | Admitting: Family Medicine

## 2021-04-24 ENCOUNTER — Encounter: Payer: Self-pay | Admitting: Family Medicine

## 2021-04-24 ENCOUNTER — Ambulatory Visit (HOSPITAL_COMMUNITY)
Admission: RE | Admit: 2021-04-24 | Discharge: 2021-04-24 | Disposition: A | Discharge status: Home / Self Care | Payer: Medicare Other | Source: Ambulatory Visit | Attending: Family Medicine | Admitting: Family Medicine

## 2021-04-24 ENCOUNTER — Other Ambulatory Visit: Payer: Self-pay

## 2021-04-24 ENCOUNTER — Ambulatory Visit (INDEPENDENT_AMBULATORY_CARE_PROVIDER_SITE_OTHER): Payer: Medicare Other | Admitting: Family Medicine

## 2021-04-24 VITALS — BP 146/73 | HR 88 | Temp 97.3°F | Ht 74.0 in | Wt 191.0 lb

## 2021-04-24 DIAGNOSIS — U071 COVID-19: Secondary | ICD-10-CM | POA: Diagnosis not present

## 2021-04-24 DIAGNOSIS — R059 Cough, unspecified: Secondary | ICD-10-CM | POA: Diagnosis not present

## 2021-04-24 NOTE — Progress Notes (Signed)
° °  Subjective:    Patient ID: Anthony Lucas, male    DOB: [birth year], 88 y.o.   MRN: [id number]  HPI Patient is here for post covid cough , low energy Patient recently had COVID He was treated with Paxlovid Since then he has had some intermittent ongoing fatigue tiredness and felt worse yesterday with increased coughing no shortness of breath no wheezing or difficulty breathing no vomiting or diarrhea no high fevers.  Energy level low.  Review of Systems     Objective:   Physical Exam Lungs clear heart regular HEENT benign  According to his daughter yesterday he felt pretty rough and did not seem to have much energy.  Today he does not feel as bad.     Assessment & Plan:   COVID infection It is quite possible he is having a mild flareup since he had stopped taking Paxlovid Proceed forward with chest x-ray hold off on antibiotics depending on what the x-ray shows  X-ray did not show any type of pneumonia but did have some changes that could be interstitial lung disease or other related issues.  Consideration for CT scan we will bring him back in 2 weeks to recheck and at that time we will also discuss whether or not he would like to proceed forward with CT scan

## 2021-04-29 DIAGNOSIS — L570 Actinic keratosis: Secondary | ICD-10-CM | POA: Diagnosis not present

## 2021-04-29 DIAGNOSIS — X32XXXD Exposure to sunlight, subsequent encounter: Secondary | ICD-10-CM | POA: Diagnosis not present

## 2021-05-13 DIAGNOSIS — B351 Tinea unguium: Secondary | ICD-10-CM | POA: Diagnosis not present

## 2021-05-13 DIAGNOSIS — E1142 Type 2 diabetes mellitus with diabetic polyneuropathy: Secondary | ICD-10-CM | POA: Diagnosis not present

## 2021-05-19 ENCOUNTER — Other Ambulatory Visit: Payer: Self-pay | Admitting: Family Medicine

## 2021-05-20 ENCOUNTER — Ambulatory Visit (INDEPENDENT_AMBULATORY_CARE_PROVIDER_SITE_OTHER): Payer: Medicare Other

## 2021-05-20 DIAGNOSIS — I428 Other cardiomyopathies: Secondary | ICD-10-CM

## 2021-05-20 LAB — CUP PACEART REMOTE DEVICE CHECK
Battery Remaining Longevity: 136 mo
Battery Voltage: 3.06 V
Brady Statistic RA Percent Paced: 0 %
Brady Statistic RV Percent Paced: 97.61 %
Date Time Interrogation Session: 20230214002238
Implantable Lead Implant Date: 20170914
Implantable Lead Implant Date: 20170914
Implantable Lead Implant Date: 20220811
Implantable Lead Location: 753858
Implantable Lead Location: 753859
Implantable Lead Location: 753860
Implantable Lead Model: 3830
Implantable Lead Model: 5076
Implantable Lead Model: 5076
Implantable Pulse Generator Implant Date: 20220811
Lead Channel Impedance Value: 228 Ohm
Lead Channel Impedance Value: 285 Ohm
Lead Channel Impedance Value: 342 Ohm
Lead Channel Impedance Value: 361 Ohm
Lead Channel Impedance Value: 418 Ohm
Lead Channel Impedance Value: 475 Ohm
Lead Channel Impedance Value: 513 Ohm
Lead Channel Impedance Value: 513 Ohm
Lead Channel Impedance Value: 513 Ohm
Lead Channel Pacing Threshold Amplitude: 0.375 V
Lead Channel Pacing Threshold Amplitude: 0.625 V
Lead Channel Pacing Threshold Pulse Width: 0.4 ms
Lead Channel Pacing Threshold Pulse Width: 0.5 ms
Lead Channel Sensing Intrinsic Amplitude: 1 mV
Lead Channel Sensing Intrinsic Amplitude: 1 mV
Lead Channel Sensing Intrinsic Amplitude: 14.375 mV
Lead Channel Sensing Intrinsic Amplitude: 14.375 mV
Lead Channel Setting Pacing Amplitude: 1 V
Lead Channel Setting Pacing Amplitude: 2 V
Lead Channel Setting Pacing Pulse Width: 0.4 ms
Lead Channel Setting Pacing Pulse Width: 0.5 ms
Lead Channel Setting Sensing Sensitivity: 2.8 mV

## 2021-05-26 NOTE — Progress Notes (Signed)
Remote pacemaker transmission.   

## 2021-06-02 ENCOUNTER — Other Ambulatory Visit: Payer: Self-pay | Admitting: Family Medicine

## 2021-06-04 DIAGNOSIS — L570 Actinic keratosis: Secondary | ICD-10-CM | POA: Diagnosis not present

## 2021-06-04 DIAGNOSIS — X32XXXD Exposure to sunlight, subsequent encounter: Secondary | ICD-10-CM | POA: Diagnosis not present

## 2021-06-04 DIAGNOSIS — C44319 Basal cell carcinoma of skin of other parts of face: Secondary | ICD-10-CM | POA: Diagnosis not present

## 2021-06-16 ENCOUNTER — Other Ambulatory Visit: Payer: Self-pay | Admitting: Family Medicine

## 2021-06-20 ENCOUNTER — Other Ambulatory Visit: Payer: Self-pay

## 2021-06-20 ENCOUNTER — Ambulatory Visit (INDEPENDENT_AMBULATORY_CARE_PROVIDER_SITE_OTHER): Payer: Medicare Other | Admitting: Urology

## 2021-06-20 VITALS — BP 123/68 | HR 78

## 2021-06-20 DIAGNOSIS — N390 Urinary tract infection, site not specified: Secondary | ICD-10-CM | POA: Diagnosis not present

## 2021-06-20 DIAGNOSIS — N3020 Other chronic cystitis without hematuria: Secondary | ICD-10-CM | POA: Diagnosis not present

## 2021-06-20 DIAGNOSIS — R339 Retention of urine, unspecified: Secondary | ICD-10-CM

## 2021-06-20 LAB — URINALYSIS, ROUTINE W REFLEX MICROSCOPIC
Bilirubin, UA: NEGATIVE
Glucose, UA: NEGATIVE
Ketones, UA: NEGATIVE
Nitrite, UA: POSITIVE — AB
Protein,UA: NEGATIVE
Specific Gravity, UA: 1.015 (ref 1.005–1.030)
Urobilinogen, Ur: 0.2 mg/dL (ref 0.2–1.0)
pH, UA: 6 (ref 5.0–7.5)

## 2021-06-20 LAB — BLADDER SCAN AMB NON-IMAGING: Scan Result: 597

## 2021-06-20 LAB — MICROSCOPIC EXAMINATION
Renal Epithel, UA: NONE SEEN /hpf
WBC, UA: >30 /hpf — AB (ref 0–5)

## 2021-06-20 MED ORDER — SILODOSIN 8 MG PO CAPS: 8.0000 mg | ORAL_CAPSULE | Freq: Two times a day (BID) | ORAL | 11 refills | Status: DC

## 2021-06-20 MED ORDER — FINASTERIDE 5 MG PO TABS: ORAL_TABLET | ORAL | 3 refills | Status: DC

## 2021-06-20 MED ORDER — NITROFURANTOIN MACROCRYSTAL 50 MG PO CAPS: 50.0000 mg | ORAL_CAPSULE | Freq: Every day | ORAL | 11 refills | Status: DC

## 2021-06-20 NOTE — Progress Notes (Signed)
post void residual=597 ?

## 2021-06-20 NOTE — Progress Notes (Signed)
06/20/2021 12:58 PM   Anthony Lucas [birth year] [id number]  Referring provider: Babs Sciara, MD [address] [place],  [place] [address]  Followup incomplete emptying and recurrent UTI   HPI: Anthony Lucas is a 88yo here for followup for BPh with incomplete emptying a recurrent UTI. PVR 597. He notes worsening urinary frequency, urgency and occasional dysuria. He stopped his rapaflo since last visit and his LUTS have worsened. UA is concerning for infection. No fevers. No gross hematuria. No other complaints today   PMH: Past Medical History:  Diagnosis Date   Arthritis    "right knee" (12/19/2015)   Chronic combined systolic (congestive) and diastolic (congestive) heart failure (HCC)    EF 50-55% in 2020 echo   Complete heart block (HCC)    S/P BiVPPM   History of colon polyps    History of gout    Hyperlipidemia    Hypertension    Myocardial infarction [facility])    "they said I'd had a heart attack but didn't know it" (12/19/2015).  No CAD on cath   Seasonal allergies    Skin cancer    "arms/hands" (12/19/2015)   Sleep apnea    supposed to wear CPAP, but doesn't (12/19/2015)   Type II diabetes mellitus [facility])     Surgical History: Past Surgical History:  Procedure Laterality Date   BIV PACEMAKER GENERATOR CHANGEOUT N/A 11/14/2020   Procedure: BIV PACEMAKER GENERATOR CHANGEOUT;  Surgeon: Marinus Maw, MD;  Location: [facility] INVASIVE CV LAB;  Service: Cardiovascular;  Laterality: N/A;   CATARACT EXTRACTION W/PHACO Right 09/08/2012   Procedure: CATARACT EXTRACTION PHACO AND INTRAOCULAR LENS PLACEMENT (IOC);  Surgeon: Gemma Payor, MD;  Location: [facility] ORS;  Service: Ophthalmology;  Laterality: Right;  CDE: 21.28   CATARACT EXTRACTION W/PHACO Left 12/08/2012   Procedure: CATARACT EXTRACTION PHACO AND INTRAOCULAR LENS PLACEMENT (IOC);  Surgeon: Gemma Payor, MD;  Location: [facility] ORS;  Service: Ophthalmology;  Laterality: Left;  CDE:25.72   CHOLECYSTECTOMY OPEN  1968   COLONOSCOPY     4  procedures   COLONOSCOPY  2012   Dr. Jarold Motto: diverticulosis, lipoma in ascending colon.    CYSTOSCOPY WITH FULGERATION N/A 08/11/2017   Procedure: CYSTOSCOPY WITH FULGERATION OF PROSTATIC VARICES;  Surgeon: Malen Gauze, MD;  Location: [facility] ORS;  Service: Urology;  Laterality: N/A;   EP IMPLANTABLE DEVICE N/A 12/19/2015   Procedure: BiV Pacemaker Insertion CRT-P;  Surgeon: Marinus Maw, MD;  Location[facility] INVAS[facility] CV [facility];  Service: Cardiovascular;  Laterality: N/A;   INSERT / REPLACE / REMOVE PACEMAKER  12/19/2015   JOINT REPLACEMENT Left    knee   LEAD REVISION/REPAIR N/A 11/14/2020   Procedure: LEAD REVISION/REPAIR;  Surgeon: Marinus Maw, MD;  Location: [facility] INVASIVE CV LAB;  Service: Cardiovascular;  Laterality: N/A;   POLYPECTOMY     TONSILLECTOMY     TOTAL KNEE ARTHROPLASTY Left 2000s   TRANSURETHRAL RESECTION OF PROSTATE     YAG LASER APPLICATION Right 06/16/2016   Procedure: YAG LASER APPLICATION;  Surgeon: Jethro Bolus, MD;  Location: [facility] ORS;  Service: Ophthalmology;  Laterality: Right;   YAG LASER APPLICATION Left 06/30/2016   Procedure: YAG LASER APPLICATION;  Surgeon: Jethro Bolus, MD;  Location: [facility] OR[facility];  Service: Ophthalmology;  Laterality: Left;    Home Medications:  Allergies as of 06/20/2021       Reactions   Bee Venom Anaphylaxis   Honey bee         Medication List  Accurate as of June 20, 2021 12:58 PM. If you have any questions, ask your nurse or doctor.          allopurinol 100 MG tablet Commonly known as: ZYLOPRIM TAKE 2 TABLETS BY MOUTH DAILY FOR GOUT PREVENTION   blood glucose meter kit and supplies Dispense based on patient and insurance preference. Use to check blood sugar once daily as directed. For E11.9.   clobetasol cream 0.05 % Commonly known as: TEMOVATE Apply 1 application topically 2 (two) times daily as needed (burns on hands).   docusate sodium 100 MG capsule Commonly known as: COLACE Take 100 mg by mouth daily.    Entresto 24-26 MG Generic drug: sacubitril-valsartan TAKE ONE TABLET BY MOUTH 2 TIMES A DAY.   finasteride 5 MG tablet Commonly known as: PROSCAR TAKE ONE TABLET BY MOUTH ONCE DAILY   metFORMIN 500 MG tablet Commonly known as: GLUCOPHAGE TAKE ONE TABLET BY MOUTH DAILY. PLEASE CONTACT OFFICE FOR APPOINTMENT.   metoprolol succinate 25 MG 24 hr tablet Commonly known as: TOPROL-XL TAKE 1 TABLET BY MOUTH TWICE DAILY.   nitrofurantoin (macrocrystal-monohydrate) 100 MG capsule Commonly known as: MACROBID Take 1 capsule (100 mg total) by mouth every 12 (twelve) hours.   nitrofurantoin 50 MG capsule Commonly known as: Macrodantin Take 1 capsule (50 mg total) by mouth at bedtime.   potassium chloride 10 MEQ tablet Commonly known as: KLOR-CON M Take 10 mEq by mouth daily.   potassium chloride 10 MEQ tablet Commonly known as: KLOR-CON TAKE (1) TABLET BY MOUTH ONCE DAILY.   silodosin 8 MG Caps capsule Commonly known as: RAPAFLO Take 1 capsule (8 mg total) by mouth in the morning and at bedtime.   simvastatin 40 MG tablet Commonly known as: ZOCOR TAKE ONE TABLET BY MOUTH EVERY DAY   torsemide 20 MG tablet Commonly known as: DEMADEX TAKE 1 TABLET BY MOUTH TWICE A DAY. What changed: when to take this        Allergies:  Allergies  Allergen Reactions   Bee Venom Anaphylaxis    Honey bee     Family History: Family History  Problem Relation Age of Onset   Lung cancer Father    Colon cancer Neg Hx     Social History:  reports that he has never smoked. He has never used smokeless tobacco. He reports that he does not currently use alcohol after a past usage of about 2.0 standard drinks per week. He reports that he does not use drugs.  ROS: All other review of systems were reviewed and are negative except what is noted above in HPI  Physical Exam: BP 123/68   Pulse 78   Constitutional:  Alert and oriented, No acute distress. HEENT: [place] AT, moist mucus membranes.   Trachea midline, no masses. Cardiovascular: No clubbing, cyanosis, or edema. Respiratory: Normal respiratory effort, no increased work of breathing. GI: Abdomen is soft, nontender, nondistended, no abdominal masses GU: No CVA tenderness.  Lymph: No cervical or inguinal lymphadenopathy. Skin: No rashes, bruises or suspicious lesions. Neurologic: Grossly intact, no focal deficits, moving all 4 extremities. Psychiatric: Normal mood and affect.  Laboratory Data: Lab Results  Component Value Date   WBC 6.8 11/12/2020   HGB 12.3 (L) 11/12/2020   HCT 39.6 11/12/2020   MCV 90.2 11/12/2020   PLT 171 11/12/2020    Lab Results  Component Value Date   CREATININE 1.27 (H) 04/12/2021    No results found for: PSA  No results found for: TESTOSTERONE  Lab  Results  Component Value Date   HGBA1C 7.2 (H) 07/09/2020    Urinalysis    Component Value Date/Time   APPEARANCEUR Cloudy (A) 12/20/2020 1103   GLUCOSEU Negative 12/20/2020 1103   BILIRUBINUR Negative 12/20/2020 1103   PROTEINUR Negative 12/20/2020 1103   UROBILINOGEN 0.2 06/27/2019 1403   NITRITE Positive (A) 12/20/2020 1103   LEUKOCYTESUR 3+ (A) 12/20/2020 1103    Lab Results  Component Value Date   LABMICR See below: 12/20/2020   WBCUA >30 (A) 12/20/2020   LABEPIT 0-10 12/20/2020   BACTERIA Many (A) 12/20/2020    Pertinent Imaging:  No results found for this or any previous visit.  No results found for this or any previous visit.  No results found for this or any previous visit.  No results found for this or any previous visit.  No results found for this or any previous visit.  No results found for this or any previous visit.  No results found for this or any previous visit.  No results found for this or any previous visit.   Assessment & Plan:    1. Incomplete emptying of bladder -restart rapaflo 8mg  BId and continue finasteride - Urinalysis, Routine w reflex microscopic - BLADDER SCAN AMB  NON-IMAGING  2. Urinary tract infection without hematuria, site unspecified -Urine for culture, will call with results  3. Chronic cystitis without hematuria -continue macrobid 50mg  qhs   No follow-ups on file.  Wilkie Aye, MD  [facility] [place]

## 2021-06-23 ENCOUNTER — Other Ambulatory Visit: Payer: Self-pay | Admitting: Family Medicine

## 2021-06-23 LAB — URINE CULTURE

## 2021-06-24 ENCOUNTER — Encounter: Payer: Self-pay | Admitting: Urology

## 2021-06-24 NOTE — Patient Instructions (Signed)

## 2021-07-08 DIAGNOSIS — X32XXXD Exposure to sunlight, subsequent encounter: Secondary | ICD-10-CM | POA: Diagnosis not present

## 2021-07-08 DIAGNOSIS — Z08 Encounter for follow-up examination after completed treatment for malignant neoplasm: Secondary | ICD-10-CM | POA: Diagnosis not present

## 2021-07-08 DIAGNOSIS — Z85828 Personal history of other malignant neoplasm of skin: Secondary | ICD-10-CM | POA: Diagnosis not present

## 2021-07-08 DIAGNOSIS — L570 Actinic keratosis: Secondary | ICD-10-CM | POA: Diagnosis not present

## 2021-07-30 ENCOUNTER — Telehealth: Payer: Self-pay

## 2021-07-30 NOTE — Telephone Encounter (Signed)
I spoke with the patient daughter and let her know per the nurses note that he can continue to use electric blanket. His device is functioning normally. Ms. Chillemi verbalized understanding and thanked me for my help.  ?

## 2021-07-30 NOTE — Telephone Encounter (Signed)
? ?  Pt's daughter calling back, she said, she got disconnected. She is calling regarding pt's device. She asked if we can call her back ?

## 2021-07-30 NOTE — Telephone Encounter (Signed)
Remote transmission reviewed. Normal device function.Called to advise that patient may continue to use the electric blanket and his device is functioning normally.  ? ?No answer, LMTCB. ?

## 2021-07-30 NOTE — Telephone Encounter (Signed)
Patient daughter Anthony Lucas called in stating her dad can no longer see the ppm through his skin and he thinks that it was the electric blanket that he has been using. He will send in a remote transmission form his home monitor. He has no symptoms at this moment  ?

## 2021-08-05 DIAGNOSIS — M1711 Unilateral primary osteoarthritis, right knee: Secondary | ICD-10-CM | POA: Diagnosis not present

## 2021-08-05 DIAGNOSIS — X32XXXD Exposure to sunlight, subsequent encounter: Secondary | ICD-10-CM | POA: Diagnosis not present

## 2021-08-05 DIAGNOSIS — L570 Actinic keratosis: Secondary | ICD-10-CM | POA: Diagnosis not present

## 2021-08-18 ENCOUNTER — Other Ambulatory Visit: Payer: Self-pay | Admitting: Family Medicine

## 2021-08-19 ENCOUNTER — Ambulatory Visit (INDEPENDENT_AMBULATORY_CARE_PROVIDER_SITE_OTHER): Payer: Medicare Other

## 2021-08-19 DIAGNOSIS — I428 Other cardiomyopathies: Secondary | ICD-10-CM

## 2021-08-19 LAB — CUP PACEART REMOTE DEVICE CHECK
Battery Remaining Longevity: 132 mo
Battery Voltage: 3.03 V
Brady Statistic RA Percent Paced: 0 %
Brady Statistic RV Percent Paced: 98.76 %
Date Time Interrogation Session: 20230516012250
Implantable Lead Implant Date: 20170914
Implantable Lead Implant Date: 20170914
Implantable Lead Implant Date: 20220811
Implantable Lead Location: 753858
Implantable Lead Location: 753859
Implantable Lead Location: 753860
Implantable Lead Model: 3830
Implantable Lead Model: 5076
Implantable Lead Model: 5076
Implantable Pulse Generator Implant Date: 20220811
Lead Channel Impedance Value: 266 Ohm
Lead Channel Impedance Value: 323 Ohm
Lead Channel Impedance Value: 380 Ohm
Lead Channel Impedance Value: 380 Ohm
Lead Channel Impedance Value: 437 Ohm
Lead Channel Impedance Value: 494 Ohm
Lead Channel Impedance Value: 513 Ohm
Lead Channel Impedance Value: 532 Ohm
Lead Channel Impedance Value: 532 Ohm
Lead Channel Pacing Threshold Amplitude: 0.625 V
Lead Channel Pacing Threshold Amplitude: 0.625 V
Lead Channel Pacing Threshold Pulse Width: 0.4 ms
Lead Channel Pacing Threshold Pulse Width: 0.5 ms
Lead Channel Sensing Intrinsic Amplitude: 0.875 mV
Lead Channel Sensing Intrinsic Amplitude: 0.875 mV
Lead Channel Sensing Intrinsic Amplitude: 8.25 mV
Lead Channel Sensing Intrinsic Amplitude: 8.25 mV
Lead Channel Setting Pacing Amplitude: 1.25 V
Lead Channel Setting Pacing Amplitude: 2 V
Lead Channel Setting Pacing Pulse Width: 0.4 ms
Lead Channel Setting Pacing Pulse Width: 0.5 ms
Lead Channel Setting Sensing Sensitivity: 2.8 mV

## 2021-09-02 ENCOUNTER — Other Ambulatory Visit: Payer: Self-pay | Admitting: Family Medicine

## 2021-09-04 NOTE — Progress Notes (Signed)
Remote pacemaker transmission.   

## 2021-09-15 ENCOUNTER — Other Ambulatory Visit: Payer: Self-pay | Admitting: Family Medicine

## 2021-09-15 NOTE — Telephone Encounter (Signed)
Med Check appt

## 2021-09-16 DIAGNOSIS — X32XXXD Exposure to sunlight, subsequent encounter: Secondary | ICD-10-CM | POA: Diagnosis not present

## 2021-09-16 DIAGNOSIS — L570 Actinic keratosis: Secondary | ICD-10-CM | POA: Diagnosis not present

## 2021-09-23 DIAGNOSIS — B351 Tinea unguium: Secondary | ICD-10-CM | POA: Diagnosis not present

## 2021-09-23 DIAGNOSIS — E1142 Type 2 diabetes mellitus with diabetic polyneuropathy: Secondary | ICD-10-CM | POA: Diagnosis not present

## 2021-09-30 ENCOUNTER — Ambulatory Visit (INDEPENDENT_AMBULATORY_CARE_PROVIDER_SITE_OTHER): Payer: Medicare Other | Admitting: Family Medicine

## 2021-09-30 VITALS — BP 134/80 | HR 75 | Temp 97.0°F | Ht 74.0 in | Wt 189.0 lb

## 2021-09-30 DIAGNOSIS — I872 Venous insufficiency (chronic) (peripheral): Secondary | ICD-10-CM | POA: Diagnosis not present

## 2021-09-30 DIAGNOSIS — M1000 Idiopathic gout, unspecified site: Secondary | ICD-10-CM | POA: Diagnosis not present

## 2021-09-30 DIAGNOSIS — E785 Hyperlipidemia, unspecified: Secondary | ICD-10-CM | POA: Diagnosis not present

## 2021-09-30 DIAGNOSIS — I739 Peripheral vascular disease, unspecified: Secondary | ICD-10-CM | POA: Diagnosis not present

## 2021-09-30 DIAGNOSIS — E1169 Type 2 diabetes mellitus with other specified complication: Secondary | ICD-10-CM

## 2021-09-30 DIAGNOSIS — I5032 Chronic diastolic (congestive) heart failure: Secondary | ICD-10-CM | POA: Diagnosis not present

## 2021-09-30 DIAGNOSIS — I25118 Atherosclerotic heart disease of native coronary artery with other forms of angina pectoris: Secondary | ICD-10-CM

## 2021-10-02 ENCOUNTER — Other Ambulatory Visit: Payer: Self-pay | Admitting: Cardiology

## 2021-10-02 DIAGNOSIS — I5022 Chronic systolic (congestive) heart failure: Secondary | ICD-10-CM

## 2021-10-02 DIAGNOSIS — E1169 Type 2 diabetes mellitus with other specified complication: Secondary | ICD-10-CM | POA: Diagnosis not present

## 2021-10-02 DIAGNOSIS — E785 Hyperlipidemia, unspecified: Secondary | ICD-10-CM | POA: Diagnosis not present

## 2021-10-02 DIAGNOSIS — M1000 Idiopathic gout, unspecified site: Secondary | ICD-10-CM | POA: Diagnosis not present

## 2021-10-03 LAB — BASIC METABOLIC PANEL
BUN/Creatinine Ratio: 16 (ref 10–24)
BUN: 22 mg/dL (ref 8–27)
CO2: 25 mmol/L (ref 20–29)
Calcium: 9 mg/dL (ref 8.6–10.2)
Chloride: 103 mmol/L (ref 96–106)
Creatinine, Ser: 1.36 mg/dL — ABNORMAL HIGH (ref 0.76–1.27)
Glucose: 179 mg/dL — ABNORMAL HIGH (ref 70–99)
Potassium: 4.1 mmol/L (ref 3.5–5.2)
Sodium: 143 mmol/L (ref 134–144)
eGFR: 50 mL/min/{1.73_m2} — ABNORMAL LOW (ref >59)

## 2021-10-03 LAB — LIPID PANEL
Chol/HDL Ratio: 2.7 ratio (ref 0.0–5.0)
Cholesterol, Total: 135 mg/dL (ref 100–199)
HDL: 50 mg/dL (ref >39)
LDL Chol Calc (NIH): 67 mg/dL (ref 0–99)
Triglycerides: 94 mg/dL (ref 0–149)
VLDL Cholesterol Cal: 18 mg/dL (ref 5–40)

## 2021-10-03 LAB — HEPATIC FUNCTION PANEL
ALT: 17 IU/L (ref 0–44)
AST: 12 IU/L (ref 0–40)
Albumin: 4.2 g/dL (ref 3.6–4.6)
Alkaline Phosphatase: 63 IU/L (ref 44–121)
Bilirubin Total: 0.5 mg/dL (ref 0.0–1.2)
Bilirubin, Direct: 0.19 mg/dL (ref 0.00–0.40)
Total Protein: 6.8 g/dL (ref 6.0–8.5)

## 2021-10-03 LAB — HEMOGLOBIN A1C
Est. average glucose Bld gHb Est-mCnc: 146 mg/dL
Hgb A1c MFr Bld: 6.7 % — ABNORMAL HIGH (ref 4.8–5.6)

## 2021-10-03 LAB — URIC ACID: Uric Acid: 7.6 mg/dL (ref 3.8–8.4)

## 2021-10-04 ENCOUNTER — Encounter: Payer: Self-pay | Admitting: Family Medicine

## 2021-10-13 ENCOUNTER — Other Ambulatory Visit: Payer: Self-pay | Admitting: Family Medicine

## 2021-10-21 ENCOUNTER — Other Ambulatory Visit: Payer: Self-pay | Admitting: Family Medicine

## 2021-10-21 ENCOUNTER — Other Ambulatory Visit: Payer: Self-pay | Admitting: Cardiology

## 2021-10-28 ENCOUNTER — Other Ambulatory Visit: Payer: Self-pay | Admitting: Cardiology

## 2021-10-28 DIAGNOSIS — I5022 Chronic systolic (congestive) heart failure: Secondary | ICD-10-CM

## 2021-11-11 DIAGNOSIS — M79675 Pain in left toe(s): Secondary | ICD-10-CM | POA: Diagnosis not present

## 2021-11-18 ENCOUNTER — Ambulatory Visit (INDEPENDENT_AMBULATORY_CARE_PROVIDER_SITE_OTHER): Payer: Medicare Other

## 2021-11-18 DIAGNOSIS — I428 Other cardiomyopathies: Secondary | ICD-10-CM | POA: Diagnosis not present

## 2021-11-18 LAB — CUP PACEART REMOTE DEVICE CHECK
Battery Remaining Longevity: 127 mo
Battery Voltage: 3.02 V
Brady Statistic RA Percent Paced: 0 %
Brady Statistic RV Percent Paced: 99.03 %
Date Time Interrogation Session: 20230815012237
Implantable Lead Implant Date: 20170914
Implantable Lead Implant Date: 20170914
Implantable Lead Implant Date: 20220811
Implantable Lead Location: 753858
Implantable Lead Location: 753859
Implantable Lead Location: 753860
Implantable Lead Model: 3830
Implantable Lead Model: 5076
Implantable Lead Model: 5076
Implantable Pulse Generator Implant Date: 20220811
Lead Channel Impedance Value: 247 Ohm
Lead Channel Impedance Value: 285 Ohm
Lead Channel Impedance Value: 342 Ohm
Lead Channel Impedance Value: 361 Ohm
Lead Channel Impedance Value: 418 Ohm
Lead Channel Impedance Value: 456 Ohm
Lead Channel Impedance Value: 494 Ohm
Lead Channel Impedance Value: 494 Ohm
Lead Channel Impedance Value: 513 Ohm
Lead Channel Pacing Threshold Amplitude: 0.625 V
Lead Channel Pacing Threshold Amplitude: 0.625 V
Lead Channel Pacing Threshold Pulse Width: 0.4 ms
Lead Channel Pacing Threshold Pulse Width: 0.5 ms
Lead Channel Sensing Intrinsic Amplitude: 0.75 mV
Lead Channel Sensing Intrinsic Amplitude: 0.75 mV
Lead Channel Sensing Intrinsic Amplitude: 8.25 mV
Lead Channel Sensing Intrinsic Amplitude: 8.25 mV
Lead Channel Setting Pacing Amplitude: 1.25 V
Lead Channel Setting Pacing Amplitude: 2 V
Lead Channel Setting Pacing Pulse Width: 0.4 ms
Lead Channel Setting Pacing Pulse Width: 0.5 ms
Lead Channel Setting Sensing Sensitivity: 2.8 mV

## 2021-11-29 ENCOUNTER — Other Ambulatory Visit: Payer: Self-pay | Admitting: Family Medicine

## 2021-12-02 DIAGNOSIS — E1142 Type 2 diabetes mellitus with diabetic polyneuropathy: Secondary | ICD-10-CM | POA: Diagnosis not present

## 2021-12-02 DIAGNOSIS — L84 Corns and callosities: Secondary | ICD-10-CM | POA: Diagnosis not present

## 2021-12-02 DIAGNOSIS — B351 Tinea unguium: Secondary | ICD-10-CM | POA: Diagnosis not present

## 2021-12-09 ENCOUNTER — Ambulatory Visit (INDEPENDENT_AMBULATORY_CARE_PROVIDER_SITE_OTHER): Payer: Medicare Other | Admitting: Family Medicine

## 2021-12-09 VITALS — BP 137/76 | HR 76 | Temp 97.5°F | Ht 74.0 in | Wt 194.0 lb

## 2021-12-09 DIAGNOSIS — I25118 Atherosclerotic heart disease of native coronary artery with other forms of angina pectoris: Secondary | ICD-10-CM | POA: Diagnosis not present

## 2021-12-09 DIAGNOSIS — R42 Dizziness and giddiness: Secondary | ICD-10-CM

## 2021-12-09 DIAGNOSIS — R270 Ataxia, unspecified: Secondary | ICD-10-CM | POA: Diagnosis not present

## 2021-12-09 MED ORDER — ONDANSETRON HCL 8 MG PO TABS: 8.0000 mg | ORAL_TABLET | Freq: Three times a day (TID) | ORAL | 0 refills | Status: AC | PRN

## 2021-12-09 NOTE — Progress Notes (Signed)
   Subjective:    Patient ID: Anthony Lucas, male    DOB: 05/13/1930, 88 y.o.   MRN: [id number]  HPI  Dizziness, loss of balance and some nausea since yesterday  Very nice gentleman Had 3 separate spells over the past couple days where he felt dizzy.  He describes it as feeling off balance.  He felt like the room was moving.  But he states that he could turn his head left and right and it would not trigger increased dizziness.  He denied any headaches.  Denied any unilateral numbness weakness.  He does have severe right knee osteoarthritis and is not a surgical candidate.  This causes him difficulty with his balance and walking. Review of Systems     Objective:   Physical Exam General-in no acute distress Eyes-no discharge Lungs-respiratory rate normal, CTA CV-no murmurs,RRR Extremities skin warm dry no edema Neuro grossly normal Behavior normal, alert Right knee osteoarthritis noted       Assessment & Plan:  1. Dizziness Possibly vertigo but no nystagmus seen Pt also stated turning head left andf right did not make it worse Can not do mri Therefore will go with CT to r/o mini stroke - CT HEAD WO CONTRAST (5MM)  2. Ataxia See above  Abnl chest xray low liklihood of cancer  Xray shown to pt and daughter Pt w/o sob Shared discussion hold off on CT

## 2021-12-10 ENCOUNTER — Other Ambulatory Visit: Payer: Self-pay

## 2021-12-10 DIAGNOSIS — R42 Dizziness and giddiness: Secondary | ICD-10-CM

## 2021-12-11 ENCOUNTER — Ambulatory Visit (HOSPITAL_COMMUNITY)
Admission: RE | Admit: 2021-12-11 | Discharge: 2021-12-11 | Disposition: A | Discharge status: Home / Self Care | Payer: Medicare Other | Source: Ambulatory Visit | Attending: Family Medicine | Admitting: Family Medicine

## 2021-12-11 DIAGNOSIS — R42 Dizziness and giddiness: Secondary | ICD-10-CM | POA: Diagnosis not present

## 2021-12-13 ENCOUNTER — Other Ambulatory Visit: Payer: Self-pay | Admitting: Family Medicine

## 2021-12-19 NOTE — Progress Notes (Signed)
Remote pacemaker transmission.   

## 2021-12-24 ENCOUNTER — Ambulatory Visit (INDEPENDENT_AMBULATORY_CARE_PROVIDER_SITE_OTHER): Payer: Medicare Other | Admitting: Urology

## 2021-12-24 VITALS — BP 144/76 | HR 83

## 2021-12-24 DIAGNOSIS — R339 Retention of urine, unspecified: Secondary | ICD-10-CM | POA: Diagnosis not present

## 2021-12-24 DIAGNOSIS — N390 Urinary tract infection, site not specified: Secondary | ICD-10-CM

## 2021-12-24 DIAGNOSIS — I25118 Atherosclerotic heart disease of native coronary artery with other forms of angina pectoris: Secondary | ICD-10-CM | POA: Diagnosis not present

## 2021-12-24 DIAGNOSIS — N3020 Other chronic cystitis without hematuria: Secondary | ICD-10-CM | POA: Diagnosis not present

## 2021-12-24 DIAGNOSIS — Z87440 Personal history of urinary (tract) infections: Secondary | ICD-10-CM | POA: Diagnosis not present

## 2021-12-24 LAB — BLADDER SCAN AMB NON-IMAGING

## 2021-12-24 MED ORDER — FINASTERIDE 5 MG PO TABS: ORAL_TABLET | ORAL | 3 refills | Status: DC

## 2021-12-24 MED ORDER — NITROFURANTOIN MACROCRYSTAL 50 MG PO CAPS: 50.0000 mg | ORAL_CAPSULE | Freq: Every day | ORAL | 11 refills | Status: DC

## 2021-12-24 MED ORDER — SILODOSIN 8 MG PO CAPS: 8.0000 mg | ORAL_CAPSULE | Freq: Two times a day (BID) | ORAL | 11 refills | Status: DC

## 2021-12-24 NOTE — Progress Notes (Signed)
post void residual=557

## 2021-12-24 NOTE — Progress Notes (Signed)
12/24/2021 1:57 PM   Anthony Lucas 1930/04/09 [id number]  Referring provider: Kathyrn Drown, MD [address] [place],  [place] [address]  Followup BPH and recurrent UTI   HPI: Anthony Lucas is a 88yo here for folowup for BPH with incomplete emptying and recurrent UTI. IPSS 12 QOl 2 on rapaflo 25m BID and finasteride. He has issues with nocturnal enuresis 3 times a week. Urine stream strong. No straining to urinate.  PVR 557cc.  No UTIs since last visit. He is on macrodantin prophylaxis   PMH: Past Medical History:  Diagnosis Date   Arthritis    "right knee" (12/19/2015)   Chronic combined systolic (congestive) and diastolic (congestive) heart failure (HCC)    EF 50-55% in 2020 echo   Complete heart block (HCC)    S/P BiVPPM   History of colon polyps    History of gout    Hyperlipidemia    Hypertension    Myocardial infarction ([facility]    "they said I'd had a heart attack but didn't know it" (12/19/2015).  No CAD on cath   Seasonal allergies    Skin cancer    "arms/hands" (12/19/2015)   Sleep apnea    supposed to wear CPAP, but doesn't (12/19/2015)   Type II diabetes mellitus ([facility]     Surgical History: Past Surgical History:  Procedure Laterality Date   BIV PACEMAKER GENERATOR CHANGEOUT N/A 11/14/2020   Procedure: BIV PACEMAKER GENERATOR CHANGEOUT;  Surgeon: TEvans Lance MD;  [facility];  Service: Cardiovascular;  Laterality: N/A;   CATARACT EXTRACTION W/PHACO Right 09/08/2012   Procedure: CATARACT EXTRACTION PHACO AND INTRAOCULAR LENS PLACEMENT (IOC);  Surgeon: KTonny Branch MD;  Location: [facility];  Service: Ophthalmology;  Laterality: Right;  CDE: 21.28   CATARACT EXTRACTION W/PHACO Left 12/08/2012   Procedure: CATARACT EXTRACTION PHACO AND INTRAOCULAR LENS PLACEMENT (IOC);  Surgeon: KTonny Branch MD;  Location: [facility];  Service: Ophthalmology;  Laterality: Left;  CDE:25.72   CHOLECYSTECTOMY OPEN  1968   COLONOSCOPY     4 procedures   COLONOSCOPY   2012   Dr. PSharlett Iles diverticulosis, lipoma in ascending colon.    CYSTOSCOPY WITH FULGERATION N/A 08/11/2017   Procedure: CYSTOSCOPY WITH FULGERATION OF PROSTATIC VARICES;  Surgeon: MCleon Gustin MD;  Location: [facility];  Service: Urology;  Laterality: N/A;   EP IMPLANTABLE DEVICE N/A 12/19/2015   Procedure: BiV Pacemaker Insertion CRT-P;  Surgeon: GEvans Lance MD;  [facility];  Service: Cardiovascular;  Laterality: N/A;   INSERT / REPLACE / REMOVE PACEMAKER  12/19/2015   JOINT REPLACEMENT Left    knee   LEAD REVISION/REPAIR N/A 11/14/2020   Procedure: LEAD REVISION/REPAIR;  Surgeon: TEvans Lance MD;  [facility];  Service: Cardiovascular;  Laterality: N/A;   POLYPECTOMY     TONSILLECTOMY     TOTAL KNEE ARTHROPLASTY Left 2000s   TRANSURETHRAL RESECTION OF PROSTATE     YAG LASER APPLICATION Right 32/54/2706  Procedure: YAG LASER APPLICATION;  Surgeon: MRutherford Guys MD;  Location: [facility];  Service: Ophthalmology;  Laterality: Right;   YAG LASER APPLICATION Left 32/37/6283  Procedure: YAG LASER APPLICATION;  Surgeon: MRutherford Guys MD;  Location: [facility];  Service: Ophthalmology;  Laterality: Left;    Home Medications:  Allergies as of 12/24/2021       Reactions   Bee Venom Anaphylaxis   Honey bee         Medication List  Accurate as of December 24, 2021  1:57 PM. If you have any questions, ask your nurse or doctor.          allopurinol 100 MG tablet Commonly known as: ZYLOPRIM TAKE 2 TABLETS BY MOUTH DAILY FOR GOUT PREVENTION   blood glucose meter kit and supplies Dispense based on patient and insurance preference. Use to check blood sugar once daily as directed. For E11.9.   clobetasol cream 0.05 % Commonly known as: TEMOVATE Apply 1 application topically 2 (two) times daily as needed (burns on hands).   docusate sodium 100 MG capsule Commonly known as: COLACE Take 100 mg by mouth daily.   Entresto 24-26  MG Generic drug: sacubitril-valsartan TAKE ONE TABLET BY MOUTH 2 TIMES A DAY.   finasteride 5 MG tablet Commonly known as: PROSCAR TAKE ONE TABLET BY MOUTH ONCE DAILY   metFORMIN 500 MG tablet Commonly known as: GLUCOPHAGE TAKE ONE TABLET BY MOUTH DAILY. PLEASE CONTACT OFFICE FOR APPOINTMENT.   metoprolol succinate 25 MG 24 hr tablet Commonly known as: TOPROL-XL TAKE 1 TABLET BY MOUTH TWICE DAILY.   nitrofurantoin (macrocrystal-monohydrate) 100 MG capsule Commonly known as: MACROBID Take 1 capsule (100 mg total) by mouth every 12 (twelve) hours.   nitrofurantoin 50 MG capsule Commonly known as: Macrodantin Take 1 capsule (50 mg total) by mouth at bedtime.   ondansetron 8 MG tablet Commonly known as: Zofran Take 1 tablet (8 mg total) by mouth every 8 (eight) hours as needed for nausea.   potassium chloride 10 MEQ tablet Commonly known as: KLOR-CON M Take 10 mEq by mouth daily.   potassium chloride 10 MEQ tablet Commonly known as: KLOR-CON TAKE (1) TABLET BY MOUTH ONCE DAILY.   silodosin 8 MG Caps capsule Commonly known as: RAPAFLO Take 1 capsule (8 mg total) by mouth in the morning and at bedtime.   simvastatin 40 MG tablet Commonly known as: ZOCOR TAKE ONE TABLET BY MOUTH EVERY DAY   torsemide 20 MG tablet Commonly known as: DEMADEX TAKE 1 TABLET BY MOUTH TWICE A DAY.        Allergies:  Allergies  Allergen Reactions   Bee Venom Anaphylaxis    Honey bee     Family History: Family History  Problem Relation Age of Onset   Lung cancer Father    Colon cancer Neg Hx     Social History:  reports that he has never smoked. He has never used smokeless tobacco. He reports that he does not currently use alcohol after a past usage of about 2.0 standard drinks of alcohol per week. He reports that he does not use drugs.  ROS: All other review of systems were reviewed and are negative except what is noted above in HPI  Physical Exam: BP (!) 144/76   Pulse 83    Constitutional:  Alert and oriented, No acute distress. HEENT: [place] AT, moist mucus membranes.  Trachea midline, no masses. Cardiovascular: No clubbing, cyanosis, or edema. Respiratory: Normal respiratory effort, no increased work of breathing. GI: Abdomen is soft, nontender, nondistended, no abdominal masses GU: No CVA tenderness.  Lymph: No cervical or inguinal lymphadenopathy. Skin: No rashes, bruises or suspicious lesions. Neurologic: Grossly intact, no focal deficits, moving all 4 extremities. Psychiatric: Normal mood and affect.  Laboratory Data: Lab Results  Component Value Date   WBC 6.8 11/12/2020   HGB 12.3 (L) 11/12/2020   HCT 39.6 11/12/2020   MCV 90.2 11/12/2020   PLT 171 11/12/2020    Lab Results  Component  Value Date   CREATININE 1.36 (H) 10/02/2021    No results found for: "PSA"  No results found for: "TESTOSTERONE"  Lab Results  Component Value Date   HGBA1C 6.7 (H) 10/02/2021    Urinalysis    Component Value Date/Time   APPEARANCEUR Cloudy (A) 06/20/2021 1328   GLUCOSEU Negative 06/20/2021 1328   BILIRUBINUR Negative 06/20/2021 1328   PROTEINUR Negative 06/20/2021 1328   UROBILINOGEN 0.2 06/27/2019 1403   NITRITE Positive (A) 06/20/2021 1328   LEUKOCYTESUR 3+ (A) 06/20/2021 1328    Lab Results  Component Value Date   LABMICR See below: 06/20/2021   WBCUA >30 (A) 06/20/2021   LABEPIT 0-10 06/20/2021   BACTERIA Moderate (A) 06/20/2021    Pertinent Imaging:  No results found for this or any previous visit.  No results found for this or any previous visit.  No results found for this or any previous visit.  No results found for this or any previous visit.  No results found for this or any previous visit.  No results found for this or any previous visit.  No results found for this or any previous visit.  No results found for this or any previous visit.   Assessment & Plan:    1. Incomplete emptying of bladder -continue rapaflo  6m BID - BLADDER SCAN AMB NON-IMAGING - Urinalysis, Routine w reflex microscopic  2. Chronic cystitis without hematuria Continue macrodantin [id number]   No follow-ups on file.  PaNicolette BangMD  [facility] [place]

## 2021-12-26 LAB — URINALYSIS, ROUTINE W REFLEX MICROSCOPIC
Bilirubin, UA: NEGATIVE
Glucose, UA: NEGATIVE
Ketones, UA: NEGATIVE
Nitrite, UA: POSITIVE — AB
Protein,UA: NEGATIVE
Specific Gravity, UA: 1.01 (ref 1.005–1.030)
Urobilinogen, Ur: 0.2 mg/dL (ref 0.2–1.0)
pH, UA: 5.5 (ref 5.0–7.5)

## 2021-12-26 LAB — MICROSCOPIC EXAMINATION: WBC, UA: >30 /hpf — AB (ref 0–5)

## 2021-12-26 LAB — URINE CULTURE

## 2021-12-27 ENCOUNTER — Other Ambulatory Visit: Payer: Self-pay

## 2021-12-31 ENCOUNTER — Encounter: Payer: Self-pay | Admitting: Urology

## 2021-12-31 NOTE — Patient Instructions (Signed)

## 2022-01-17 ENCOUNTER — Other Ambulatory Visit: Payer: Self-pay | Admitting: Family Medicine

## 2022-01-17 ENCOUNTER — Other Ambulatory Visit: Payer: Self-pay | Admitting: Internal Medicine

## 2022-01-19 ENCOUNTER — Other Ambulatory Visit: Payer: Self-pay | Admitting: Family Medicine

## 2022-01-20 DIAGNOSIS — L570 Actinic keratosis: Secondary | ICD-10-CM | POA: Diagnosis not present

## 2022-01-20 DIAGNOSIS — X32XXXD Exposure to sunlight, subsequent encounter: Secondary | ICD-10-CM | POA: Diagnosis not present

## 2022-02-05 IMAGING — DX DG CHEST 2V
2 series · 2 of 2 positions shown · non-contrast
Comparison: Chest x-ray 11/14/2020. CT chest 11/19/2008.

CLINICAL DATA: Follow-up [id number].  Cough.

EXAM:
CHEST - 2 VIEW

[chest pa]
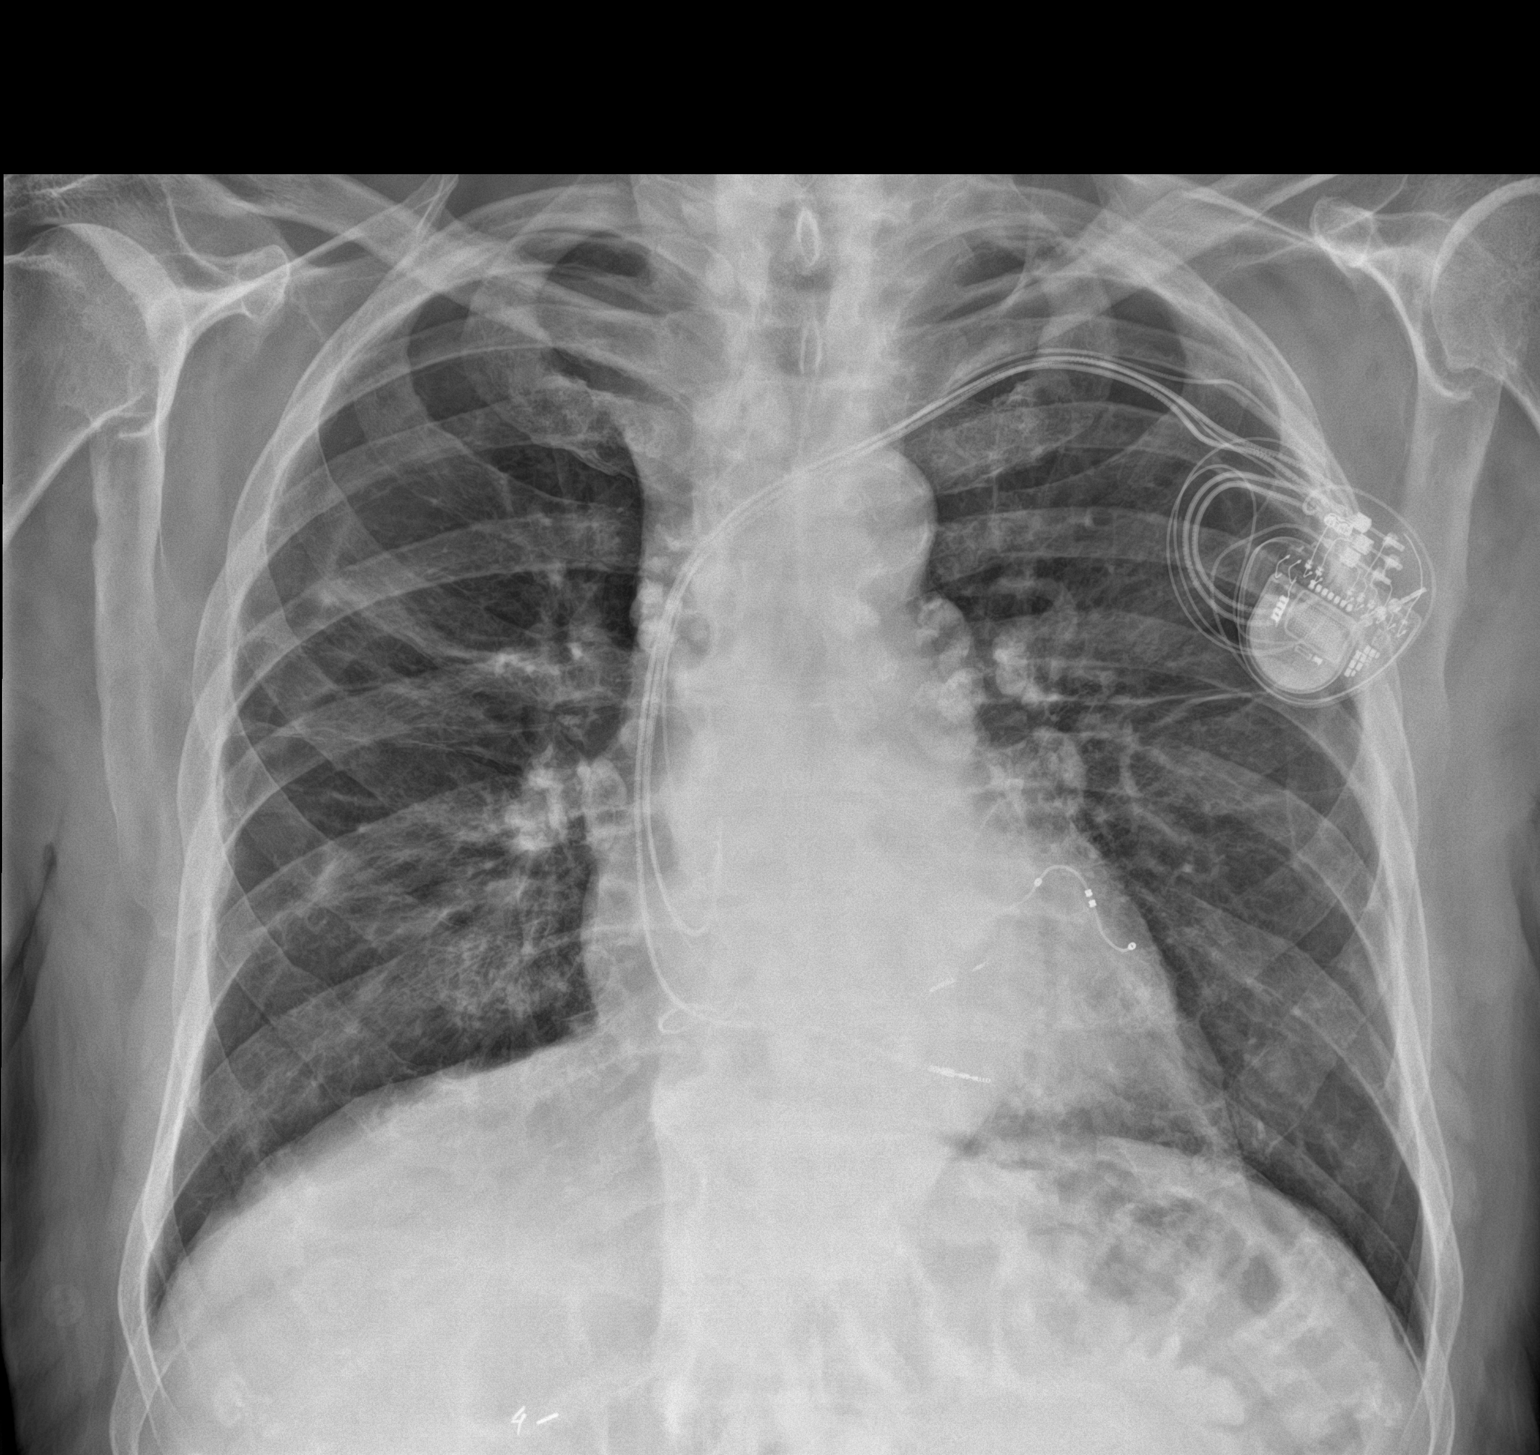

[chest lat]
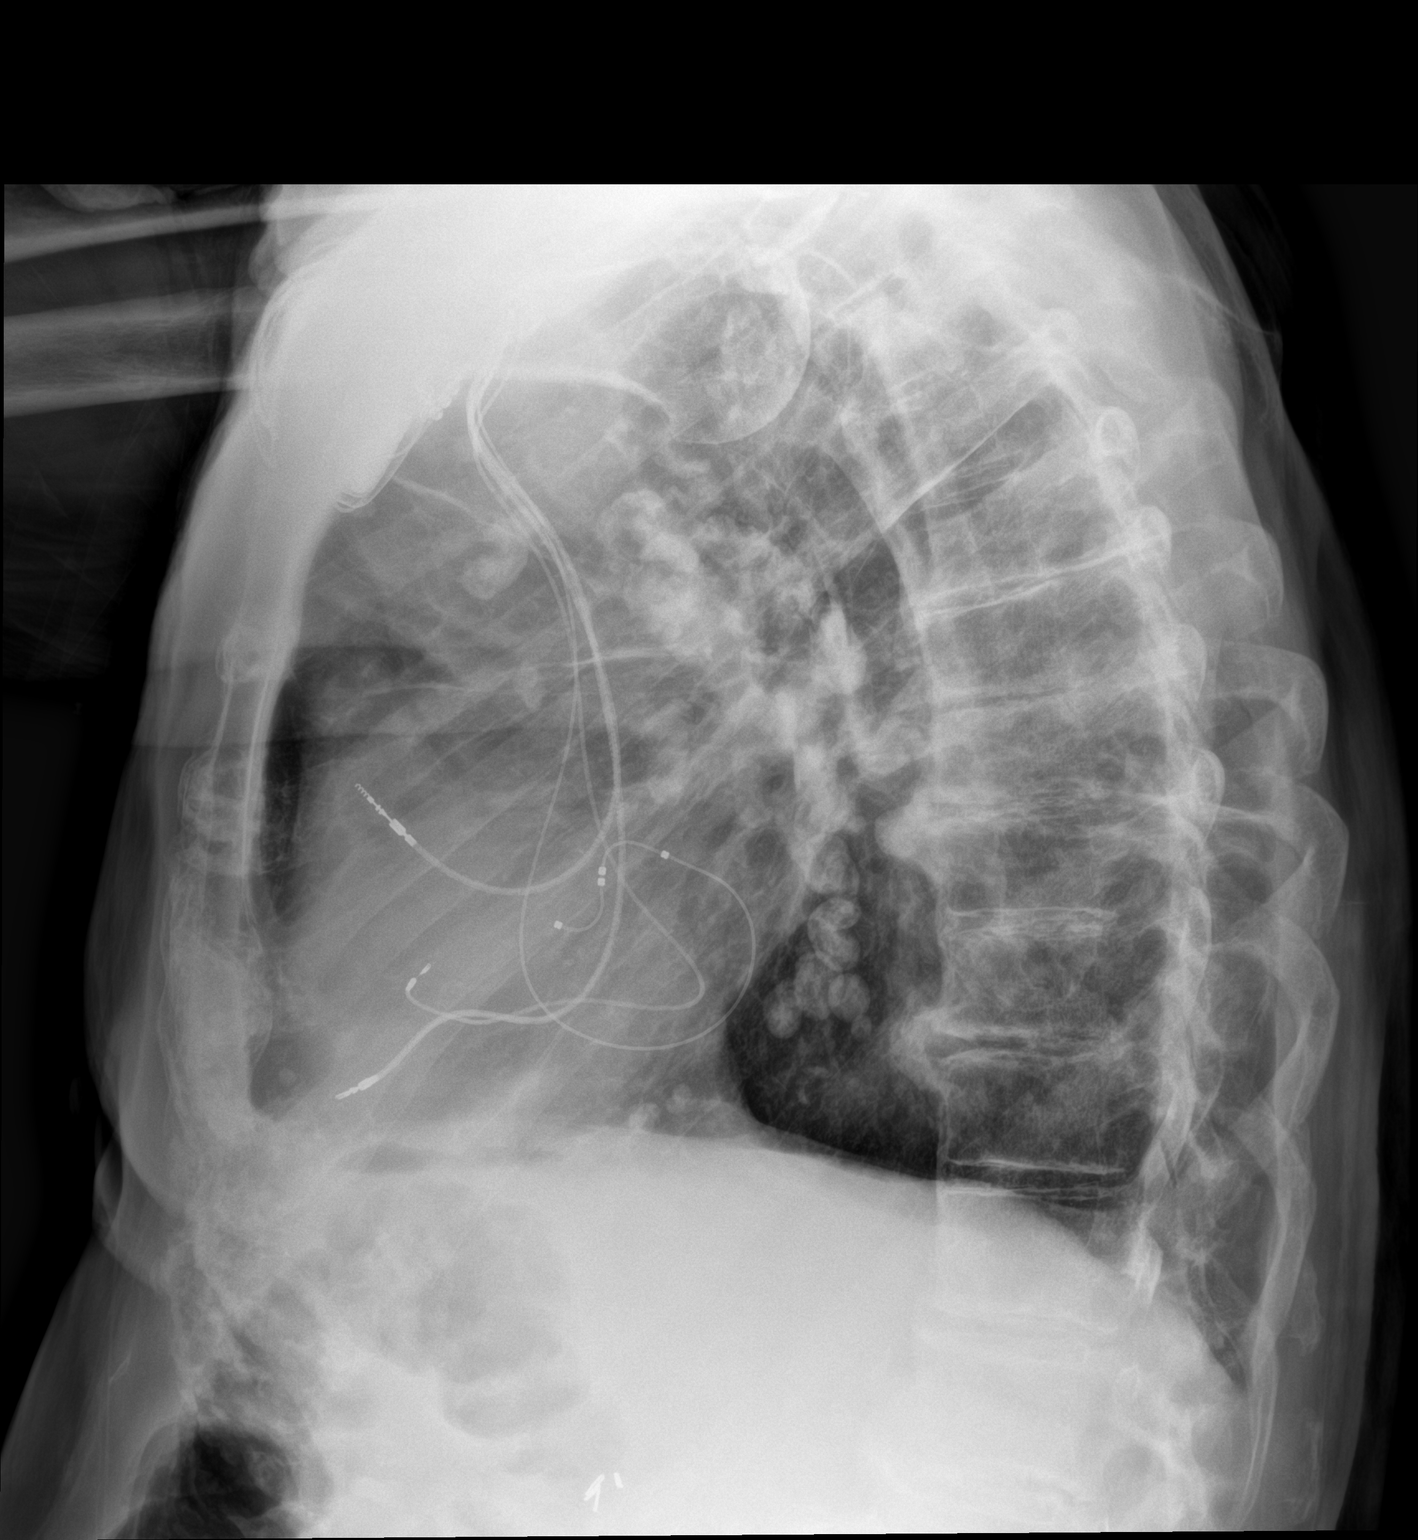

[2 of 2 positions shown; findings below may reference images not displayed]

FINDINGS: Left-sided ICD is again noted. Cardiomediastinal silhouette is
stable, the heart is mildly enlarged. Calcified bilateral hilar
lymph nodes and mediastinal lymph nodes are again seen. There is
some patchy and strandy airspace opacities in the right mid lung
similar to prior study. There is no new focal lung infiltrate,
pleural effusion or pneumothorax. No acute fractures are seen.
IMPRESSION: 1. There are stable minimal strandy and patchy opacities in the
right mid lung. Findings may represent scarring, chronic infection
or other etiology. Consider follow-up chest CT as clinically
appropriate.
2. No new focal lung infiltrate.
3. Stable calcified mediastinal and hilar lymph nodes.

## 2022-02-10 DIAGNOSIS — L84 Corns and callosities: Secondary | ICD-10-CM | POA: Diagnosis not present

## 2022-02-10 DIAGNOSIS — B351 Tinea unguium: Secondary | ICD-10-CM | POA: Diagnosis not present

## 2022-02-10 DIAGNOSIS — E1142 Type 2 diabetes mellitus with diabetic polyneuropathy: Secondary | ICD-10-CM | POA: Diagnosis not present

## 2022-02-10 DIAGNOSIS — M79673 Pain in unspecified foot: Secondary | ICD-10-CM | POA: Diagnosis not present

## 2022-02-13 ENCOUNTER — Other Ambulatory Visit: Payer: Self-pay

## 2022-02-13 MED ORDER — METFORMIN HCL 500 MG PO TABS: ORAL_TABLET | ORAL | 0 refills | Status: DC

## 2022-02-14 ENCOUNTER — Other Ambulatory Visit: Payer: Self-pay | Admitting: Internal Medicine

## 2022-02-17 ENCOUNTER — Ambulatory Visit (INDEPENDENT_AMBULATORY_CARE_PROVIDER_SITE_OTHER): Payer: Medicare Other

## 2022-02-17 DIAGNOSIS — I428 Other cardiomyopathies: Secondary | ICD-10-CM

## 2022-02-17 LAB — CUP PACEART REMOTE DEVICE CHECK
Battery Remaining Longevity: 127 mo
Battery Voltage: 3.02 V
Brady Statistic RA Percent Paced: 0 %
Brady Statistic RV Percent Paced: 99.52 %
Date Time Interrogation Session: 20231114002050
Implantable Lead Connection Status: 753985
Implantable Lead Connection Status: 753985
Implantable Lead Connection Status: 753985
Implantable Lead Implant Date: 20170914
Implantable Lead Implant Date: 20170914
Implantable Lead Implant Date: 20220811
Implantable Lead Location: 753858
Implantable Lead Location: 753859
Implantable Lead Location: 753860
Implantable Lead Model: 3830
Implantable Lead Model: 5076
Implantable Lead Model: 5076
Implantable Pulse Generator Implant Date: 20220811
Lead Channel Impedance Value: 247 Ohm
Lead Channel Impedance Value: 285 Ohm
Lead Channel Impedance Value: 342 Ohm
Lead Channel Impedance Value: 361 Ohm
Lead Channel Impedance Value: 418 Ohm
Lead Channel Impedance Value: 475 Ohm
Lead Channel Impedance Value: 513 Ohm
Lead Channel Impedance Value: 513 Ohm
Lead Channel Impedance Value: 513 Ohm
Lead Channel Pacing Threshold Amplitude: 0.5 V
Lead Channel Pacing Threshold Amplitude: 0.625 V
Lead Channel Pacing Threshold Pulse Width: 0.4 ms
Lead Channel Pacing Threshold Pulse Width: 0.5 ms
Lead Channel Sensing Intrinsic Amplitude: 1.125 mV
Lead Channel Sensing Intrinsic Amplitude: 1.125 mV
Lead Channel Sensing Intrinsic Amplitude: 12.125 mV
Lead Channel Sensing Intrinsic Amplitude: 12.125 mV
Lead Channel Setting Pacing Amplitude: 1 V
Lead Channel Setting Pacing Amplitude: 2 V
Lead Channel Setting Pacing Pulse Width: 0.4 ms
Lead Channel Setting Pacing Pulse Width: 0.5 ms
Lead Channel Setting Sensing Sensitivity: 2.8 mV
Zone Setting Status: 755011
Zone Setting Status: 755011

## 2022-02-18 ENCOUNTER — Ambulatory Visit (INDEPENDENT_AMBULATORY_CARE_PROVIDER_SITE_OTHER): Payer: Medicare Other

## 2022-02-18 DIAGNOSIS — Z23 Encounter for immunization: Secondary | ICD-10-CM | POA: Diagnosis not present

## 2022-02-26 NOTE — Progress Notes (Signed)
Cardiology Office Note:    Date:  03/02/2022   ID:  JAYLEON MCFARLANE, DOB [birth year], MRN [id number]  PCP:  Kathyrn Drown, MD   [place] [facility] Cardiologist:  Rozann Lesches, MD Electrophysiologist:  Cristopher Peru, MD   Referring MD: Kathyrn Drown, MD   History of Present Illness:    Anthony Lucas is a 88 y.o. male with a hx of NICM (EF initially 20-25% in 2017 but improved to 50-55% in 2020) with biventricular heart failure, s/p CRT-P with recent battery change out for ERI in 11/2020, chronic Afib not on [facility] due to bleeding issues, OSA not on CPAP, HTN, HLD, DM2, and chronic LBBB who presents to clinic for follow-up.     Was last seen in clinic by Dr. Radford Pax where he was doing well from a CV standpoint.  Today, the patient overall feels well. No chest pain, SOB, LE edema, orthopnea or palpitations. Blood pressure is well controlled. Tolerating medications without issue. Mobility limited due to right knee pain but he is still able to walk some.   Past Medical History:  Diagnosis Date   Arthritis    "right knee" (12/19/2015)   Chronic combined systolic (congestive) and diastolic (congestive) heart failure (HCC)    EF 50-55% in 2020 echo   Complete heart block (HCC)    S/P BiVPPM   History of colon polyps    History of gout    Hyperlipidemia    Hypertension    Myocardial infarction [facility])    "they said I'd had a heart attack but didn't know it" (12/19/2015).  No CAD on cath   Seasonal allergies    Skin cancer    "arms/hands" (12/19/2015)   Sleep apnea    supposed to wear CPAP, but doesn't (12/19/2015)   Type II diabetes mellitus [facility])     Past Surgical History:  Procedure Laterality Date   BIV PACEMAKER GENERATOR CHANGEOUT N/A 11/14/2020   Procedure: BIV PACEMAKER GENERATOR CHANGEOUT;  Surgeon: Evans Lance, MD;  Location: [place] [facility];  Service: Cardiovascular;  Laterality: N/A;   CATARACT EXTRACTION W/PHACO Right 09/08/2012   Procedure: CATARACT  EXTRACTION PHACO AND INTRAOCULAR LENS PLACEMENT (IOC);  Surgeon: Tonny Branch, MD;  Location: [facility];  Service: Ophthalmology;  Laterality: Right;  CDE: 21.28   CATARACT EXTRACTION W/PHACO Left 12/08/2012   Procedure: CATARACT EXTRACTION PHACO AND INTRAOCULAR LENS PLACEMENT (IOC);  Surgeon: Tonny Branch, MD;  Location: [facility];  Service: Ophthalmology;  Laterality: Left;  CDE:25.72   CHOLECYSTECTOMY OPEN  1968   COLONOSCOPY     4 procedures   COLONOSCOPY  2012   Dr. Sharlett Iles: diverticulosis, lipoma in ascending colon.    CYSTOSCOPY WITH FULGERATION N/A 08/11/2017   Procedure: CYSTOSCOPY WITH FULGERATION OF PROSTATIC VARICES;  Surgeon: Cleon Gustin, MD;  Location: [facility];  Service: Urology;  Laterality: N/A;   EP IMPLANTABLE DEVICE N/A 12/19/2015   Procedure: BiV Pacemaker Insertion CRT-P;  Surgeon: Evans Lance, MD;  Location: [place] [facility];  Service: Cardiovascular;  Laterality: N/A;   INSERT / REPLACE / REMOVE PACEMAKER  12/19/2015   JOINT REPLACEMENT Left    knee   LEAD REVISION/REPAIR N/A 11/14/2020   Procedure: LEAD REVISION/REPAIR;  Surgeon: Evans Lance, MD;  Location: [place] [facility];  Service: Cardiovascular;  Laterality: N/A;   POLYPECTOMY     TONSILLECTOMY     TOTAL KNEE ARTHROPLASTY Left 2000s   TRANSURETHRAL RESECTION OF PROSTATE     YAG  LASER APPLICATION Right 09/05/930   Procedure: YAG LASER APPLICATION;  Surgeon: [name], MD;  Location: [facility];  Service: Ophthalmology;  Laterality: Right;   YAG LASER APPLICATION Left 3/55/7322   Procedure: YAG LASER APPLICATION;  Surgeon: [name], MD;  Location: [facility];  Service: Ophthalmology;  Laterality: Left;    Current Medications: Current Meds  Medication Sig   allopurinol (ZYLOPRIM) 100 MG tablet TAKE 2 TABLETS BY MOUTH DAILY FOR GOUT PREVENTION   blood glucose meter kit and supplies Dispense based on patient and insurance preference. Use to check blood sugar once daily as directed. For E11.9.   clobetasol  cream (TEMOVATE) 0.25 % Apply 1 application topically 2 (two) times daily as needed (burns on hands).   docusate sodium (COLACE) 100 MG capsule Take 100 mg by mouth daily.   ENTRESTO 24-26 MG TAKE ONE TABLET BY MOUTH 2 TIMES A DAY.   finasteride (PROSCAR) 5 MG tablet TAKE ONE TABLET BY MOUTH ONCE DAILY   metFORMIN (GLUCOPHAGE) 500 MG tablet TAKE ONE TABLET BY MOUTH DAILY. PLEASE CONTACT OFFICE FOR APPOINTMENT.   metoprolol succinate (TOPROL-XL) 25 MG 24 hr tablet TAKE 1 TABLET BY MOUTH TWICE DAILY.   nitrofurantoin (MACRODANTIN) 50 MG capsule Take 1 capsule (50 mg total) by mouth at bedtime.   ondansetron (ZOFRAN) 8 MG tablet Take 1 tablet (8 mg total) by mouth every 8 (eight) hours as needed for nausea.   potassium chloride (KLOR-CON) 10 MEQ tablet Take 10 mEq by mouth daily.   potassium chloride (KLOR-CON) 10 MEQ tablet TAKE (1) TABLET BY MOUTH ONCE DAILY.   silodosin (RAPAFLO) 8 MG CAPS capsule Take 1 capsule (8 mg total) by mouth in the morning and at bedtime.   simvastatin (ZOCOR) 40 MG tablet TAKE ONE TABLET BY MOUTH EVERY DAY   torsemide (DEMADEX) 20 MG tablet TAKE 1 TABLET BY MOUTH TWICE A DAY.     Allergies:   Bee venom   Social History   Socioeconomic History   Marital status: Widowed    Spouse name: Not on file   Number of children: Not on file   Years of education: Not on file   Highest education level: Not on file  Occupational History   Not on file  Tobacco Use   Smoking status: Never   Smokeless tobacco: Never  Vaping Use   Vaping Use: Never used  Substance and Sexual Activity   Alcohol use: Not Currently    Alcohol/week: 2.0 standard drinks of alcohol    Types: 2 Glasses of wine per week   Drug use: No   Sexual activity: Not Currently    Birth control/protection: None  Other Topics Concern   Not on file  Social History Narrative   Not on file   Social Determinants of Health   Financial Resource Strain: Not on file  Food Insecurity: Not on file   Transportation Needs: Not on file  Physical Activity: Not on file  Stress: Not on file  Social Connections: Not on file     Family History: The patient's family history includes Lung cancer in his father. There is no history of Colon cancer.  ROS:   Please see the history of present illness.     All other systems reviewed and are negative.  EKGs/Labs/Other Studies Reviewed:    The following studies were reviewed today: TTE 01-29-19: IMPRESSIONS     1. Left ventricular ejection fraction, by visual estimation, is 50 to  55%. The left ventricle has normal function.  There is mildly increased  left ventricular hypertrophy.   2. Left ventricular diastolic Doppler parameters are indeterminate  pattern of LV diastolic filling.   3. Global right ventricle has normal systolic function.The right  ventricular size is normal. Moderately increased right ventricular wall  thickness.   4. Left atrial size was normal.   5. Right atrial size was moderately dilated.   6. The mitral valve is normal in structure. No evidence of mitral valve  regurgitation. No evidence of mitral stenosis.   7. The tricuspid valve is normal in structure. Tricuspid valve  regurgitation is trivial.   8. The aortic valve has an indeterminant number of cusps Aortic valve  regurgitation was not visualized by color flow Doppler. Mild to moderate  aortic valve sclerosis/calcification without any evidence of aortic  stenosis.   9. The pulmonic valve was not well visualized. Pulmonic valve  regurgitation is not visualized by color flow Doppler.  10. Mildly elevated pulmonary artery systolic pressure.  11. The inferior vena cava is normal in size with greater than 50%  respiratory variability, suggesting right atrial pressure of 3 mmHg.  12. The interatrial septum was not well visualized.   CATH: 11/14/2008 HEMODYNAMIC DATA:  1. Right atrial pressure 7.  2. RV 38/9.  3. Pulmonary artery 37/18, mean 27.  4.  Pulmonary capillary wedge 14.  5. Thermodilution cardiac output 4.0 liters per minute.  6. Thermodilution cardiac index 1.7 liters per minute per meter      squared.  7. Fick cardiac output 5.3 liters per minute.  8. Fick cardiac index 2.3 liters per minute per meter squared.  9. Aortic saturation 96%.  10.Pulmonary artery saturation 68%.  11.Superior vena cava saturation 66%.    ANGIOGRAPHIC DATA:  1. Ventriculography done in the RAO projection revealed preserved with      low normal overall ejection fraction, estimated EF would be in the      range of 50-55%.  On one beat, there was some diastolic mitral      regurgitation but systolic MR was not thought to be prominent.  No      definite wall motion abnormalities were visualized.  2. The right coronary artery is a moderate-sized vessel.  It is      relatively smooth with minimal luminal irregularity.  There is a      large PDA and 2 smaller posterolateral branches, both of which are      without critical narrowing.  3. The left main coronary artery is free of critical disease.  4. The LAD courses to the apex.  After a small diagonal and septal,      there is a little bit of calcified plaque measuring about 30%      luminal reduction.  The distal LAD tapers but is without critical      disease.  5. The circumflex provides predominantly 2 large marginal branches,      both of which are free of critical disease.    CONCLUSIONS:  1. Mild pulmonary hypertension.  2. No critical coronary artery disease.  3. Low normal overall ejection fraction.  4. Abnormal chest x-ray.    EKG:  EKG is  ordered today.  The ekg ordered today demonstrates v-paced with HR 63  Recent Labs: 10/02/2021: ALT 17; BUN 22; Creatinine, Ser 1.36; Potassium 4.1; Sodium 143  Recent Lipid Panel    Component Value Date/Time   CHOL 135 10/02/2021 0921   TRIG 94 10/02/2021 0921   HDL 50  10/02/2021 0921   CHOLHDL 2.7 10/02/2021 0921   CHOLHDL 3.4 01/23/2014  0841   VLDL 24 01/23/2014 0841   LDLCALC 67 10/02/2021 0921     Risk Assessment/Calculations:    CHA2DS2-VASc Score = 5  This indicates a 7.2% annual risk of stroke. The patient's score is based upon: CHF History: 1 HTN History: 1 Diabetes History: 1 Stroke History: 0 Vascular Disease History: 0 Age Score: 2 Gender Score: 0              Physical Exam:    VS:  BP 118/66   Pulse 63   Ht 6' 1.5" (1.867 m)   Wt 192 lb 6.4 oz (87.3 kg)   SpO2 99%   BMI 25.04 kg/m     Wt Readings from Last 3 Encounters:  03/02/22 192 lb 6.4 oz (87.3 kg)  12/09/21 194 lb (88 kg)  09/30/21 189 lb (85.7 kg)     GEN:  Well nourished, well developed in no acute distress HEENT: Normal NECK: No JVD; No carotid bruits CARDIAC: RRR, 1/6 systolic murmur RESPIRATORY:  Clear to auscultation without rales, wheezing or rhonchi  ABDOMEN: Soft, non-tender, non-distended MUSCULOSKELETAL:  No edema; No deformity  SKIN: Warm and dry NEUROLOGIC:  Alert and oriented x 3 PSYCHIATRIC:  Normal affect   ASSESSMENT:    1. Chronic systolic congestive heart failure ([place])   2. NICM (nonischemic cardiomyopathy) (HCC)   3. Heart block AV complete ([place])   4. Permanent atrial fibrillation ([place])   5. Essential hypertension    PLAN:    In order of problems listed above:  #Chronic Combined Systolic and Diastolic HF with Improved EF: #NICM: Patient with EF as low as 20-25% in 2017 but improved to 50-55% in 2020. Has CRT device in place with recent battery change in 11/2020. Cath in 2010 without obstructive disease. Myoview 2017 with evidence of infarction but no ischemia. Currently, he is doing well and is compensated on exam with NYHA class II symptoms. -Continue entresto 24-20m BID -Continue metop 274mXL daily -Continue torsemide 2075mID -Low Na diet  #Permanent Afib: CHADs-vasc 5. Well rate controlled. Not on AC due to history of bleeding. Follows with Dr. TayLovena LeContinue metop 40m1m  daily -[facility] of [facility] (per review of the record, has had rectal bleeding in the past)  #CHB: #Chronic LBBB: S/p CRT-P. Follows with Dr. TaylLovena LeFollow-up with Dr. TaylLovena Lescheduled  #HTN: BP well controlled. -Continue entresto 24-26mg74m -Continue metop 40mg 5maily           Medication Adjustments/Labs and Tests Ordered: Current medicines are reviewed at length with the patient today.  Concerns regarding medicines are outlined above.  Orders Placed This Encounter  Procedures   EKG 12-Lead   No orders of the defined types were placed in this encounter.   Patient Instructions  Medication Instructions:   Your physician recommends that you continue on your current medications as directed. Please refer to the Current Medication list given to you today.  *If you need a refill on your cardiac medications before your next appointment, please call your pharmacy*    Follow-Up: [facility].  As part of our continuing mission to provide you with exceptional heart care, we have created designated Provider Care Teams.  These Care Teams include your primary Cardiologist (physician) and Advanced Practice Providers (APPs -  Physician Assistants and Nurse Practitioners) who  all work together to provide you with the care you need, when you need it.  We recommend signing up for the patient portal called "MyChart".  Sign up information is provided on this After Visit Summary.  MyChart is used to connect with patients for Virtual Visits (Telemedicine).  Patients are able to view lab/test results, encounter notes, upcoming appointments, etc.  Non-urgent messages can be sent to your provider as well.   To learn more about what you can do with MyChart, go to [email or web address].    Your next appointment:   6 month(s)  The format for your next appointment:   In Person  Provider:   You will see one of the  following Advanced Practice Providers on your designated Care Team:   Bernerd Pho, PA-C  Ermalinda Barrios, [place]    Important Information About Sugar         Signed, Freada Bergeron, MD  03/02/2022 2:25 PM    [place]

## 2022-03-02 ENCOUNTER — Other Ambulatory Visit: Payer: Self-pay | Admitting: Family Medicine

## 2022-03-02 ENCOUNTER — Encounter: Payer: Self-pay | Admitting: Cardiology

## 2022-03-02 ENCOUNTER — Other Ambulatory Visit: Payer: Self-pay | Admitting: Cardiology

## 2022-03-02 ENCOUNTER — Ambulatory Visit: Payer: Medicare Other | Attending: Cardiology | Admitting: Cardiology

## 2022-03-02 VITALS — BP 118/66 | HR 63 | Ht 73.5 in | Wt 192.4 lb

## 2022-03-02 DIAGNOSIS — I10 Essential (primary) hypertension: Secondary | ICD-10-CM | POA: Diagnosis not present

## 2022-03-02 DIAGNOSIS — I5022 Chronic systolic (congestive) heart failure: Secondary | ICD-10-CM | POA: Diagnosis not present

## 2022-03-02 DIAGNOSIS — I4821 Permanent atrial fibrillation: Secondary | ICD-10-CM | POA: Diagnosis not present

## 2022-03-02 DIAGNOSIS — I428 Other cardiomyopathies: Secondary | ICD-10-CM | POA: Insufficient documentation

## 2022-03-02 DIAGNOSIS — I442 Atrioventricular block, complete: Secondary | ICD-10-CM | POA: Insufficient documentation

## 2022-03-02 NOTE — Patient Instructions (Signed)
Medication Instructions:   Your physician recommends that you continue on your current medications as directed. Please refer to the Current Medication list given to you today.  *If you need a refill on your cardiac medications before your next appointment, please call your pharmacy*    Follow-Up: At [facility], you and your health needs are our priority.  As part of our continuing mission to provide you with exceptional heart care, we have created designated Provider Care Teams.  These Care Teams include your primary Cardiologist (physician) and Advanced Practice Providers (APPs -  Physician Assistants and Nurse Practitioners) who all work together to provide you with the care you need, when you need it.  We recommend signing up for the patient portal called "MyChart".  Sign up information is provided on this After Visit Summary.  MyChart is used to connect with patients for Virtual Visits (Telemedicine).  Patients are able to view lab/test results, encounter notes, upcoming appointments, etc.  Non-urgent messages can be sent to your provider as well.   To learn more about what you can do with MyChart, go to [email or web address].    Your next appointment:   6 month(s)  The format for your next appointment:   In Person  Provider:   You will see one of the following Advanced Practice Providers on your designated Care Team:   [place], PA-C  Ermalinda Barrios, [place]    Important Information About Sugar

## 2022-03-03 ENCOUNTER — Ambulatory Visit: Payer: Medicare Other | Attending: Internal Medicine | Admitting: Internal Medicine

## 2022-03-03 ENCOUNTER — Encounter: Payer: Self-pay | Admitting: Internal Medicine

## 2022-03-03 VITALS — BP 120/62 | HR 76 | Ht 73.0 in | Wt 193.0 lb

## 2022-03-03 DIAGNOSIS — I5020 Unspecified systolic (congestive) heart failure: Secondary | ICD-10-CM | POA: Diagnosis not present

## 2022-03-03 DIAGNOSIS — I25118 Atherosclerotic heart disease of native coronary artery with other forms of angina pectoris: Secondary | ICD-10-CM | POA: Diagnosis not present

## 2022-03-03 DIAGNOSIS — Z950 Presence of cardiac pacemaker: Secondary | ICD-10-CM | POA: Diagnosis not present

## 2022-03-03 NOTE — Progress Notes (Signed)
HPI [name] returns today for followup. He has a h/o chronic systolic heart failure, s/p biv PPM insertion. He underwent insertion of a left bundle area lead when we last changed him out as his LV CS lead had a very high output.  He has chronic atrial fib with CHB. He denies chest pain or sob. No syncope.  Allergies  Allergen Reactions   Bee Venom Anaphylaxis    Honey bee      Current Outpatient Medications  Medication Sig Dispense Refill   allopurinol (ZYLOPRIM) 100 MG tablet TAKE 2 TABLETS BY MOUTH DAILY FOR GOUT PREVENTION 60 tablet 0   blood glucose meter kit and supplies Dispense based on patient and insurance preference. Use to check blood sugar once daily as directed. For E11.9. 1 each 5   clobetasol cream (TEMOVATE) 3.87 % Apply 1 application topically 2 (two) times daily as needed (burns on hands).     docusate sodium (COLACE) 100 MG capsule Take 100 mg by mouth daily.     ENTRESTO 24-26 MG TAKE ONE TABLET BY MOUTH 2 TIMES A DAY. 60 tablet 5   finasteride (PROSCAR) 5 MG tablet TAKE ONE TABLET BY MOUTH ONCE DAILY 90 tablet 3   metFORMIN (GLUCOPHAGE) 500 MG tablet TAKE ONE TABLET BY MOUTH DAILY. PLEASE CONTACT OFFICE FOR APPOINTMENT. 90 tablet 0   metoprolol succinate (TOPROL-XL) 25 MG 24 hr tablet TAKE 1 TABLET BY MOUTH TWICE DAILY. 180 tablet 0   nitrofurantoin (MACRODANTIN) 50 MG capsule Take 1 capsule (50 mg total) by mouth at bedtime. 30 capsule 11   ondansetron (ZOFRAN) 8 MG tablet Take 1 tablet (8 mg total) by mouth every 8 (eight) hours as needed for nausea. 15 tablet 0   potassium chloride (KLOR-CON) 10 MEQ tablet Take 10 mEq by mouth daily.     potassium chloride (KLOR-CON) 10 MEQ tablet TAKE (1) TABLET BY MOUTH ONCE DAILY. 90 tablet 3   silodosin (RAPAFLO) 8 MG CAPS capsule Take 1 capsule (8 mg total) by mouth in the morning and at bedtime. 60 capsule 11   simvastatin (ZOCOR) 40 MG tablet TAKE ONE TABLET BY MOUTH EVERY DAY 90 tablet 0   torsemide (DEMADEX) 20 MG  tablet Take 1 tablet (20 mg total) by mouth 2 (two) times daily. 180 tablet 3   No current facility-administered medications for this visit.     Past Medical History:  Diagnosis Date   Arthritis    "right knee" (12/19/2015)   Chronic combined systolic (congestive) and diastolic (congestive) heart failure (HCC)    EF 50-55% in 2020 echo   Complete heart block (HCC)    S/P BiVPPM   History of colon polyps    History of gout    Hyperlipidemia    Hypertension    Myocardial infarction [facility])    "they said I'd had a heart attack but didn't know it" (12/19/2015).  No CAD on cath   Seasonal allergies    Skin cancer    "arms/hands" (12/19/2015)   Sleep apnea    supposed to wear CPAP, but doesn't (12/19/2015)   Type II diabetes mellitus ([place])     ROS:   All systems reviewed and negative except as noted in the HPI.   Past Surgical History:  Procedure Laterality Date   BIV PACEMAKER GENERATOR CHANGEOUT N/A 11/14/2020   Procedure: BIV PACEMAKER GENERATOR CHANGEOUT;  Surgeon: Anthony Lance, MD;  Location: [place] [facility];  Service: Cardiovascular;  Laterality: N/A;  CATARACT EXTRACTION W/PHACO Right 09/08/2012   Procedure: CATARACT EXTRACTION PHACO AND INTRAOCULAR LENS PLACEMENT (IOC);  Surgeon: Anthony Branch, MD;  Location: [facility];  Service: Ophthalmology;  Laterality: Right;  CDE: 21.28   CATARACT EXTRACTION W/PHACO Left 12/08/2012   Procedure: CATARACT EXTRACTION PHACO AND INTRAOCULAR LENS PLACEMENT (IOC);  Surgeon: Anthony Branch, MD;  Location: [facility];  Service: Ophthalmology;  Laterality: Left;  CDE:25.72   CHOLECYSTECTOMY OPEN  1968   COLONOSCOPY     4 procedures   COLONOSCOPY  2012   Dr. Sharlett Lucas: diverticulosis, lipoma in ascending colon.    CYSTOSCOPY WITH FULGERATION N/A 08/11/2017   Procedure: CYSTOSCOPY WITH FULGERATION OF PROSTATIC VARICES;  Surgeon: Anthony Gustin, MD;  Location: [facility];  Service: Urology;  Laterality: N/A;   EP IMPLANTABLE DEVICE N/A 12/19/2015   Procedure:  BiV Pacemaker Insertion CRT-P;  Surgeon: Anthony Lance, MD;  Location: [place] [facility];  Service: Cardiovascular;  Laterality: N/A;   INSERT / REPLACE / REMOVE PACEMAKER  12/19/2015   JOINT REPLACEMENT Left    knee   LEAD REVISION/REPAIR N/A 11/14/2020   Procedure: LEAD REVISION/REPAIR;  Surgeon: Anthony Lance, MD;  Location: [place] [facility];  Service: Cardiovascular;  Laterality: N/A;   POLYPECTOMY     TONSILLECTOMY     TOTAL KNEE ARTHROPLASTY Left 2000s   TRANSURETHRAL RESECTION OF PROSTATE     YAG LASER APPLICATION Right 0/97/3532   Procedure: YAG LASER APPLICATION;  Surgeon: Anthony Guys, MD;  Location: [facility];  Service: Ophthalmology;  Laterality: Right;   YAG LASER APPLICATION Left 9/92/4268   Procedure: YAG LASER APPLICATION;  Surgeon: Anthony Guys, MD;  Location: [facility];  Service: Ophthalmology;  Laterality: Left;     Family History  Problem Relation Age of Onset   Lung cancer Father    Colon cancer Neg Hx      Social History   Socioeconomic History   Marital status: Widowed    Spouse name: Not on file   Number of children: Not on file   Years of education: Not on file   Highest education level: Not on file  Occupational History   Not on file  Tobacco Use   Smoking status: Never   Smokeless tobacco: Never  Vaping Use   Vaping Use: Never used  Substance and Sexual Activity   Alcohol use: Not Currently    Alcohol/week: 2.0 standard drinks of alcohol    Types: 2 Glasses of wine per week   Drug use: No   Sexual activity: Not Currently    Birth control/protection: None  Other Topics Concern   Not on file  Social History Narrative   Not on file   Social Determinants of Health   Financial Resource Strain: Not on file  Food Insecurity: Not on file  Transportation Needs: Not on file  Physical Activity: Not on file  Stress: Not on file  Social Connections: Not on file  Intimate Partner Violence: Not on file     BP 120/62 (BP Location: Right Arm,  Patient Position: Sitting, Cuff Size: Normal)   Pulse 76   Ht _0  (1.854 m)   Wt 193 lb (87.5 kg)   BMI 25.46 kg/m   Physical Exam:  Well appearing NAD HEENT: Unremarkable Neck:  No JVD, no thyromegally Lymphatics:  No adenopathy Back:  No CVA tenderness Lungs:  Clear HEART:  Regular rate rhythm, no murmurs, no rubs, no clicks Abd:  soft, positive bowel sounds, no organomegally, no rebound, no  guarding Ext:  2 plus pulses, no edema, no cyanosis, no clubbing Skin:  No rashes no nodules Neuro:  CN II through XII intact, motor grossly intact   DEVICE  Normal device function.  See PaceArt for details.   Assess/Plan:  1. Chronic systolic heart failure - his symptoms are class 2 on medical therapy. He will follow. His fluid index looks good. 2. BIV PPM - His Medtronic biv device is working normally.  3. CHB - he has no escape today, s/p PPM. 4. Atrial fib - his VR is well controlled. He will continue his current meds. He is not a candidate for systemic anti-coag due to bleeding.    Carleene Overlie Rolin Schult,MD

## 2022-03-03 NOTE — Patient Instructions (Signed)
Medication Instructions:  Your physician recommends that you continue on your current medications as directed. Please refer to the Current Medication list given to you today.  *If you need a refill on your cardiac medications before your next appointment, please call your pharmacy*   Lab Work: NONE   If you have labs (blood work) drawn today and your tests are completely normal, you will receive your results only by: MyChart Message (if you have MyChart) OR A paper copy in the mail If you have any lab test that is abnormal or we need to change your treatment, we will call you to review the results.   Testing/Procedures: NONE    Follow-Up: At [facility], you and your health needs are our priority.  As part of our continuing mission to provide you with exceptional heart care, we have created designated Provider Care Teams.  These Care Teams include your primary Cardiologist (physician) and Advanced Practice Providers (APPs -  Physician Assistants and Nurse Practitioners) who all work together to provide you with the care you need, when you need it.  We recommend signing up for the patient portal called "MyChart".  Sign up information is provided on this After Visit Summary.  MyChart is used to connect with patients for Virtual Visits (Telemedicine).  Patients are able to view lab/test results, encounter notes, upcoming appointments, etc.  Non-urgent messages can be sent to your provider as well.   To learn more about what you can do with MyChart, go to [email or web address].    Your next appointment:   1 year(s)  The format for your next appointment:   In Person  Provider:   Gregg Taylor, MD    Other Instructions Thank you for choosing [facility]!    Important Information About Sugar       

## 2022-03-04 DIAGNOSIS — D0462 Carcinoma in situ of skin of left upper limb, including shoulder: Secondary | ICD-10-CM | POA: Diagnosis not present

## 2022-03-17 NOTE — Progress Notes (Signed)
Remote pacemaker transmission.   

## 2022-03-20 ENCOUNTER — Other Ambulatory Visit: Payer: Self-pay | Admitting: Cardiology

## 2022-04-02 ENCOUNTER — Ambulatory Visit (INDEPENDENT_AMBULATORY_CARE_PROVIDER_SITE_OTHER): Payer: Medicare Other | Admitting: Family Medicine

## 2022-04-02 VITALS — BP 108/62 | HR 63 | Temp 97.9°F | Wt 192.6 lb

## 2022-04-02 DIAGNOSIS — I509 Heart failure, unspecified: Secondary | ICD-10-CM | POA: Diagnosis not present

## 2022-04-02 DIAGNOSIS — I25118 Atherosclerotic heart disease of native coronary artery with other forms of angina pectoris: Secondary | ICD-10-CM | POA: Diagnosis not present

## 2022-04-02 DIAGNOSIS — I10 Essential (primary) hypertension: Secondary | ICD-10-CM

## 2022-04-02 DIAGNOSIS — E785 Hyperlipidemia, unspecified: Secondary | ICD-10-CM | POA: Diagnosis not present

## 2022-04-02 DIAGNOSIS — E1169 Type 2 diabetes mellitus with other specified complication: Secondary | ICD-10-CM

## 2022-04-02 DIAGNOSIS — I5020 Unspecified systolic (congestive) heart failure: Secondary | ICD-10-CM | POA: Diagnosis not present

## 2022-04-02 MED ORDER — SIMVASTATIN 40 MG PO TABS: 40.0000 mg | ORAL_TABLET | Freq: Every day | ORAL | 3 refills | Status: DC

## 2022-04-02 MED ORDER — METFORMIN HCL 500 MG PO TABS: ORAL_TABLET | ORAL | 1 refills | Status: DC

## 2022-04-02 MED ORDER — ALLOPURINOL 100 MG PO TABS: ORAL_TABLET | ORAL | 6 refills | Status: DC

## 2022-04-02 NOTE — Progress Notes (Addendum)
   Subjective:    Patient ID: Anthony Lucas, male    DOB: Jan 29, 1931, 88 y.o.   MRN: [id number]  HPI Patient arrives for 6 mouth follow up for diabetes and HTN check up. The patient was seen today as part of a comprehensive diabetic check up. Patient has diabetes Patient relates good compliance with taking the medication. We discussed their diet and exercise activities  We also discussed the importance of notifying us if any excessively high glucoses or low sugars.  This verifies that the history review of any tests, physical exam, assessment and plan were conducted by Dr. Sallee Lange and documented accordingly by him today Sallee Lange MD primary care [facility]   Patient here for follow-up regarding cholesterol.    Patient relates taking medication on a regular basis Denies problems with medication Importance of dietary measures discussed Regular lab work regarding lipid and liver was checked and if needing additional labs was appropriately ordered   Patient states no concerns or issues today.  Review of Systems     Objective:   Physical Exam General-in no acute distress Eyes-no discharge Lungs-respiratory rate normal, CTA CV-no murmurs,RRR Extremities skin warm dry no edema Neuro grossly normal Behavior normal, alert        Assessment & Plan:  1. Systolic congestive heart failure, unspecified HF chronicity ([place]) Stable currently continue current medications follow-up with cardiology  2. Controlled type 2 diabetes mellitus with other specified complication, without long-term current use of insulin (HCC) Healthy diet repeat lab work previous labs look good - Hemoglobin A1c - Microalbumin / creatinine urine ratio  3. Hyperlipidemia associated with type 2 diabetes mellitus ([place]) Continue since simvastatin tolerating well healthy diet - Lipid panel - Hepatic Function Panel  4. Congestive heart failure, unspecified HF chronicity, unspecified heart  failure type ([place]) Stable lab work ordered - Basic Metabolic Panel (7)

## 2022-04-03 LAB — BASIC METABOLIC PANEL (7)
BUN/Creatinine Ratio: 14 (ref 10–24)
BUN: 19 mg/dL (ref 8–27)
CO2: 25 mmol/L (ref 20–29)
Chloride: 102 mmol/L (ref 96–106)
Creatinine, Ser: 1.39 mg/dL — ABNORMAL HIGH (ref 0.76–1.27)
Glucose: 130 mg/dL — ABNORMAL HIGH (ref 70–99)
Potassium: 4.3 mmol/L (ref 3.5–5.2)
Sodium: 143 mmol/L (ref 134–144)
eGFR: 48 mL/min/{1.73_m2} — ABNORMAL LOW (ref >59)

## 2022-04-03 LAB — MICROALBUMIN / CREATININE URINE RATIO
Creatinine, Urine: 66.2 mg/dL
Microalb/Creat Ratio: 44 mg/g creat — ABNORMAL HIGH (ref 0–29)
Microalbumin, Urine: 29.3 ug/mL

## 2022-04-03 LAB — HEMOGLOBIN A1C
Est. average glucose Bld gHb Est-mCnc: 146 mg/dL
Hgb A1c MFr Bld: 6.7 % — ABNORMAL HIGH (ref 4.8–5.6)

## 2022-04-03 LAB — HEPATIC FUNCTION PANEL
ALT: 17 IU/L (ref 0–44)
AST: 16 IU/L (ref 0–40)
Albumin: 4.5 g/dL (ref 3.7–4.7)
Alkaline Phosphatase: 74 IU/L (ref 44–121)
Bilirubin Total: 0.6 mg/dL (ref 0.0–1.2)
Bilirubin, Direct: 0.19 mg/dL (ref 0.00–0.40)
Total Protein: 7.2 g/dL (ref 6.0–8.5)

## 2022-04-03 LAB — LIPID PANEL
Chol/HDL Ratio: 2.9 ratio (ref 0.0–5.0)
Cholesterol, Total: 137 mg/dL (ref 100–199)
HDL: 47 mg/dL (ref >39)
LDL Chol Calc (NIH): 65 mg/dL (ref 0–99)
Triglycerides: 146 mg/dL (ref 0–149)
VLDL Cholesterol Cal: 25 mg/dL (ref 5–40)

## 2022-04-05 ENCOUNTER — Encounter: Payer: Self-pay | Admitting: Family Medicine

## 2022-04-05 NOTE — Progress Notes (Signed)
Please mail to the patient I am not certain he reads MyChart

## 2022-04-07 ENCOUNTER — Other Ambulatory Visit: Payer: Self-pay | Admitting: Cardiology

## 2022-04-15 DIAGNOSIS — C44629 Squamous cell carcinoma of skin of left upper limb, including shoulder: Secondary | ICD-10-CM | POA: Diagnosis not present

## 2022-04-15 DIAGNOSIS — C44219 Basal cell carcinoma of skin of left ear and external auricular canal: Secondary | ICD-10-CM | POA: Diagnosis not present

## 2022-04-15 DIAGNOSIS — D0462 Carcinoma in situ of skin of left upper limb, including shoulder: Secondary | ICD-10-CM | POA: Diagnosis not present

## 2022-04-21 DIAGNOSIS — E1142 Type 2 diabetes mellitus with diabetic polyneuropathy: Secondary | ICD-10-CM | POA: Diagnosis not present

## 2022-04-21 DIAGNOSIS — L84 Corns and callosities: Secondary | ICD-10-CM | POA: Diagnosis not present

## 2022-04-21 DIAGNOSIS — B351 Tinea unguium: Secondary | ICD-10-CM | POA: Diagnosis not present

## 2022-05-05 ENCOUNTER — Telehealth: Payer: Self-pay | Admitting: Family Medicine

## 2022-05-05 NOTE — Telephone Encounter (Signed)
FYI-[place]-Granddaughter calling to let us know that we may be receiving information regarding in home care for patient.

## 2022-05-05 NOTE — Telephone Encounter (Signed)
Ok noted  

## 2022-05-12 ENCOUNTER — Other Ambulatory Visit: Payer: Self-pay | Admitting: Internal Medicine

## 2022-05-19 ENCOUNTER — Ambulatory Visit: Payer: Medicare Other

## 2022-05-19 DIAGNOSIS — I428 Other cardiomyopathies: Secondary | ICD-10-CM | POA: Diagnosis not present

## 2022-05-19 LAB — CUP PACEART REMOTE DEVICE CHECK
Battery Remaining Longevity: 124 mo
Battery Voltage: 3.01 V
Brady Statistic RA Percent Paced: 0 %
Brady Statistic RV Percent Paced: 99.07 %
Date Time Interrogation Session: 20240213002110
Implantable Lead Connection Status: 753985
Implantable Lead Connection Status: 753985
Implantable Lead Connection Status: 753985
Implantable Lead Implant Date: 20170914
Implantable Lead Implant Date: 20170914
Implantable Lead Implant Date: 20220811
Implantable Lead Location: 753858
Implantable Lead Location: 753859
Implantable Lead Location: 753860
Implantable Lead Model: 3830
Implantable Lead Model: 5076
Implantable Lead Model: 5076
Implantable Pulse Generator Implant Date: 20220811
Lead Channel Impedance Value: 247 Ohm
Lead Channel Impedance Value: 304 Ohm
Lead Channel Impedance Value: 342 Ohm
Lead Channel Impedance Value: 361 Ohm
Lead Channel Impedance Value: 418 Ohm
Lead Channel Impedance Value: 475 Ohm
Lead Channel Impedance Value: 494 Ohm
Lead Channel Impedance Value: 513 Ohm
Lead Channel Impedance Value: 551 Ohm
Lead Channel Pacing Threshold Amplitude: 0.5 V
Lead Channel Pacing Threshold Amplitude: 0.625 V
Lead Channel Pacing Threshold Pulse Width: 0.4 ms
Lead Channel Pacing Threshold Pulse Width: 0.5 ms
Lead Channel Sensing Intrinsic Amplitude: 17.125 mV
Lead Channel Sensing Intrinsic Amplitude: 17.125 mV
Lead Channel Sensing Intrinsic Amplitude: 2.5 mV
Lead Channel Sensing Intrinsic Amplitude: 2.5 mV
Lead Channel Setting Pacing Amplitude: 1 V
Lead Channel Setting Pacing Amplitude: 2 V
Lead Channel Setting Pacing Pulse Width: 0.4 ms
Lead Channel Setting Pacing Pulse Width: 0.5 ms
Lead Channel Setting Sensing Sensitivity: 2.8 mV
Zone Setting Status: 755011
Zone Setting Status: 755011

## 2022-05-25 ENCOUNTER — Telehealth: Payer: Self-pay

## 2022-05-25 NOTE — Telephone Encounter (Signed)
Please advise. Thank you

## 2022-05-25 NOTE — Telephone Encounter (Signed)
Denyse Amass dropped off LT Care form to be completed for Anthony Lucas for [facility]. Placed form in Dr Nicki Reaper folder up front  Call Denyse Amass back when ready to be picked up [phone]

## 2022-05-26 NOTE — Telephone Encounter (Signed)
Nurses I am not opposed to Anthony Lucas getting assistance but I can tell you from experience that if we do not do a in person discussion and evaluation about how he is doing at home and in what way does he need assistance then it is unlikely that just filling out the form alone will get him the desired result.  I would recommend a follow-up office visit to discuss his current health specifically how it affects him in regards to taking care of himself. Such ADLs include-fixing food for himself, doing housework, shopping, driving, bathing himself. So therefore I recommend an office visit this can be somewhere in the next few weeks because slots tend to fill up quite a bit (The form did have son-in-law name [phone]-I would for start off with talking with the patient

## 2022-05-27 NOTE — Telephone Encounter (Signed)
Left message to return call.  

## 2022-06-04 ENCOUNTER — Telehealth: Payer: Self-pay | Admitting: Family Medicine

## 2022-06-04 NOTE — Telephone Encounter (Signed)
May do 11:30 AM on Monday, March 4

## 2022-06-04 NOTE — Telephone Encounter (Signed)
Anthony Lucas- with home health stating  she is needing form as soon as possible . The first available appointment on your schedule is 3/27 please advise where to add to schedule if possible

## 2022-06-08 ENCOUNTER — Ambulatory Visit (INDEPENDENT_AMBULATORY_CARE_PROVIDER_SITE_OTHER): Payer: Medicare Other | Admitting: Family Medicine

## 2022-06-08 VITALS — BP 138/74 | Ht 73.0 in | Wt 191.4 lb

## 2022-06-08 DIAGNOSIS — I739 Peripheral vascular disease, unspecified: Secondary | ICD-10-CM

## 2022-06-08 DIAGNOSIS — R531 Weakness: Secondary | ICD-10-CM | POA: Diagnosis not present

## 2022-06-08 DIAGNOSIS — E1169 Type 2 diabetes mellitus with other specified complication: Secondary | ICD-10-CM | POA: Diagnosis not present

## 2022-06-08 DIAGNOSIS — I25118 Atherosclerotic heart disease of native coronary artery with other forms of angina pectoris: Secondary | ICD-10-CM | POA: Diagnosis not present

## 2022-06-08 DIAGNOSIS — E785 Hyperlipidemia, unspecified: Secondary | ICD-10-CM | POA: Diagnosis not present

## 2022-06-08 DIAGNOSIS — C61 Malignant neoplasm of prostate: Secondary | ICD-10-CM

## 2022-06-08 DIAGNOSIS — I5020 Unspecified systolic (congestive) heart failure: Secondary | ICD-10-CM | POA: Diagnosis not present

## 2022-06-08 NOTE — Progress Notes (Signed)
   Subjective:    Patient ID: Anthony Lucas, male    DOB: 10-Sep-1930, 89 y.o.   MRN: [id number]  HPI  Patient arrives to have form completed for long term care services. Patient would like form completed where granddaughter can be his paid caregiver in the home. This patient has underlying CHF He also has weakness associated with CHF including shortness of breath and fatigue tiredness and inability to sustain effort He also has type 2 diabetes. Referral vascular disease issues Also has BPH with incontinence issues also has history of prostate cancer Patient also has severe motor limitations related to his lack of stamina related to CHF as well as severe osteoarthritis of the right knee which limits the ability to straighten the leg which makes walking very difficult  Patient's wife died several years ago Patient cannot fix his food, he relies on family to fix his food, shock, laundry, housecleaning  Patient is able to utilize the bathroom but does have incontinence issues for which his family helps clean up Patient is able to do basic showers currently  Patient is at higher risk of falls because of his balance issues along with stamina issues from CHF as well as osteoarthritis issues of the right knee Review of Systems     Objective:   Physical Exam Lungs are clear heart regular neuropathy noted in the feet extremities trace edema       Assessment & Plan:  1. Systolic congestive heart failure, unspecified HF chronicity ([place]) Followed by cardiology very closely he is on multiple medicines.  Even with this has significant impairment of function  2. Hyperlipidemia associated with type 2 diabetes mellitus ([place]) On statin currently continue healthy diet  3. PVD (peripheral vascular disease) with claudication (HCC) Stable but impacts his stamina  4. Malignant neoplasm of prostate ([place]) Followed by urology  5. Atherosclerotic heart disease of native coronary artery with other  forms of angina pectoris ([place]) Followed by cardiology  High risk of falls related to his right knee osteoarthritis and abnormal gait Due to his stamina and his multiple health problems this patient needs to have a home attendant several hours per day (it would be beneficial for patient to have at least 4 hours/day) to help with food preparation, basic housecleaning, laundry, helping the patient with incontinence issues as well as helping with transportation needs shopping etc.  Patient also has clinical weakness associated with underlying health issues.  He is unable to walk long distances He is unsteady with his gait we have recommended for him to use a cane when walking.  This makes it very difficult for him to carry anything safely.  Typically family has to fix the meal and bring it to him or set the meal at the table he is not capable of fixing his meal and then bringing it to the table.  He is not capable of carrying the laundry around.  Should also be noted that although the patient does do his showers he has poor balance.  He would benefit from having someone assisting him.  In addition to this as stated above would have someone benefiting him by being present to help do food prep, general cleaning, laundry, occasionally dealing with enuresis and incontinence, and helping him with grocery shopping.  In my opinion this patient would benefit from having this every day.  What ever the insurance policy would allow for would be helpful.

## 2022-06-11 ENCOUNTER — Telehealth: Payer: Self-pay | Admitting: Family Medicine

## 2022-06-11 NOTE — Telephone Encounter (Signed)
Form was filled out Please send a copy of the office note with this Please fill in the bottom portion that is administrative work on this form thank you

## 2022-06-22 NOTE — Progress Notes (Signed)
Remote pacemaker transmission.   

## 2022-06-23 ENCOUNTER — Ambulatory Visit: Payer: Medicare Other | Admitting: Urology

## 2022-06-29 ENCOUNTER — Ambulatory Visit: Payer: Medicare Other | Admitting: Urology

## 2022-07-01 ENCOUNTER — Ambulatory Visit: Payer: Medicare Other | Admitting: Family Medicine

## 2022-08-18 ENCOUNTER — Ambulatory Visit (INDEPENDENT_AMBULATORY_CARE_PROVIDER_SITE_OTHER): Payer: Medicare Other

## 2022-08-18 DIAGNOSIS — I428 Other cardiomyopathies: Secondary | ICD-10-CM

## 2022-08-18 LAB — CUP PACEART REMOTE DEVICE CHECK
Battery Remaining Longevity: 118 mo
Battery Voltage: 3.01 V
Brady Statistic RA Percent Paced: 0 %
Brady Statistic RV Percent Paced: 99.35 %
Date Time Interrogation Session: 20240514093947
Implantable Lead Connection Status: 753985
Implantable Lead Connection Status: 753985
Implantable Lead Connection Status: 753985
Implantable Lead Implant Date: 20170914
Implantable Lead Implant Date: 20170914
Implantable Lead Implant Date: 20220811
Implantable Lead Location: 753858
Implantable Lead Location: 753859
Implantable Lead Location: 753860
Implantable Lead Model: 3830
Implantable Lead Model: 5076
Implantable Lead Model: 5076
Implantable Pulse Generator Implant Date: 20220811
Lead Channel Impedance Value: 247 Ohm
Lead Channel Impedance Value: 304 Ohm
Lead Channel Impedance Value: 342 Ohm
Lead Channel Impedance Value: 361 Ohm
Lead Channel Impedance Value: 418 Ohm
Lead Channel Impedance Value: 475 Ohm
Lead Channel Impedance Value: 494 Ohm
Lead Channel Impedance Value: 513 Ohm
Lead Channel Impedance Value: 532 Ohm
Lead Channel Pacing Threshold Amplitude: 0.625 V
Lead Channel Pacing Threshold Amplitude: 0.625 V
Lead Channel Pacing Threshold Pulse Width: 0.4 ms
Lead Channel Pacing Threshold Pulse Width: 0.5 ms
Lead Channel Sensing Intrinsic Amplitude: 0.75 mV
Lead Channel Sensing Intrinsic Amplitude: 0.75 mV
Lead Channel Sensing Intrinsic Amplitude: 17.125 mV
Lead Channel Sensing Intrinsic Amplitude: 17.125 mV
Lead Channel Setting Pacing Amplitude: 1.25 V
Lead Channel Setting Pacing Amplitude: 2 V
Lead Channel Setting Pacing Pulse Width: 0.4 ms
Lead Channel Setting Pacing Pulse Width: 0.5 ms
Lead Channel Setting Sensing Sensitivity: 2.8 mV
Zone Setting Status: 755011
Zone Setting Status: 755011

## 2022-08-20 DIAGNOSIS — L84 Corns and callosities: Secondary | ICD-10-CM | POA: Diagnosis not present

## 2022-08-20 DIAGNOSIS — B351 Tinea unguium: Secondary | ICD-10-CM | POA: Diagnosis not present

## 2022-08-20 DIAGNOSIS — E1142 Type 2 diabetes mellitus with diabetic polyneuropathy: Secondary | ICD-10-CM | POA: Diagnosis not present

## 2022-09-02 ENCOUNTER — Ambulatory Visit: Payer: Medicare Other | Attending: Student | Admitting: Student

## 2022-09-02 ENCOUNTER — Encounter: Payer: Self-pay | Admitting: Student

## 2022-09-02 VITALS — BP 116/64 | HR 80 | Ht 73.0 in | Wt 194.0 lb

## 2022-09-02 DIAGNOSIS — I5032 Chronic diastolic (congestive) heart failure: Secondary | ICD-10-CM | POA: Diagnosis not present

## 2022-09-02 DIAGNOSIS — I10 Essential (primary) hypertension: Secondary | ICD-10-CM | POA: Insufficient documentation

## 2022-09-02 DIAGNOSIS — I442 Atrioventricular block, complete: Secondary | ICD-10-CM | POA: Diagnosis not present

## 2022-09-02 DIAGNOSIS — I4821 Permanent atrial fibrillation: Secondary | ICD-10-CM | POA: Insufficient documentation

## 2022-09-02 NOTE — Patient Instructions (Signed)
Medication Instructions:  Your physician recommends that you continue on your current medications as directed. Please refer to the Current Medication list given to you today.   Labwork: None today  Testing/Procedures: None today  Follow-Up: 6 months  Any Other Special Instructions Will Be Listed Below (If Applicable).  If you need a refill on your cardiac medications before your next appointment, please call your pharmacy.  

## 2022-09-02 NOTE — Progress Notes (Signed)
Cardiology Office Note    Date:  09/02/2022  ID:  Anthony Lucas, DOB Aug 27, 1930, MRN [id number] Cardiologist: Nona Dell, MD   EP: Dr. Ladona Ridgel  History of Present Illness:    Anthony Lucas is a 89 y.o. male with past medical history of HFimpEF (EF 20-25% in 2017, at 50-55% in 2020), CRT-P in place, persistent atrial fibrillation, HTN, HLD, LBBB and Type 2 DM who presents to the office today for 24-month follow-up.   He was examined by Laurance Flatten, MD in 02/2022 and reported overall doing well at that time and denied any recent anginal symptoms. His activity was limited secondary to right knee pain. He was continued on his current medical therapy including Entresto 24-26 mg twice daily, Toprol-XL 25 mg daily and Torsemide 20 mg twice daily. He was not on anticoagulation due to history of bleeding by review of his chart.  In talking with the patient today, he reports overall doing well from a cardiac perspective since his last office visit. He denies any recent chest pain or palpitations. No specific dyspnea on exertion, orthopnea, PND or pitting edema. Reports his main limiting factor at this time continues to be arthritis along his right knee. He thought his heart muscle was still pumping at 25% and was unaware that this had normalized by repeat imaging in 2020.   Studies Reviewed:   EKG: EKG is not ordered today. EKG from 03/02/2022 is reviewed and demonstrates V-paced rhythm, HR 63.   Echocardiogram: 01/2019 IMPRESSIONS     1. Left ventricular ejection fraction, by visual estimation, is 50 to  55%. The left ventricle has normal function. There is mildly increased  left ventricular hypertrophy.   2. Left ventricular diastolic Doppler parameters are indeterminate  pattern of LV diastolic filling.   3. Global right ventricle has normal systolic function.The right  ventricular size is normal. Moderately increased right ventricular wall  thickness.   4. Left atrial size was  normal.   5. Right atrial size was moderately dilated.   6. The mitral valve is normal in structure. No evidence of mitral valve  regurgitation. No evidence of mitral stenosis.   7. The tricuspid valve is normal in structure. Tricuspid valve  regurgitation is trivial.   8. The aortic valve has an indeterminant number of cusps Aortic valve  regurgitation was not visualized by color flow Doppler. Mild to moderate  aortic valve sclerosis/calcification without any evidence of aortic  stenosis.   9. The pulmonic valve was not well visualized. Pulmonic valve  regurgitation is not visualized by color flow Doppler.  10. Mildly elevated pulmonary artery systolic pressure.  11. The inferior vena cava is normal in size with greater than 50%  respiratory variability, suggesting right atrial pressure of 3 mmHg.  12. The interatrial septum was not well visualized.    Physical Exam:   VS:  BP 116/64   Pulse 80   Ht 6\' 1"  (1.854 m)   Wt 194 lb (88 kg)   SpO2 98%   BMI 25.60 kg/m    Wt Readings from Last 3 Encounters:  09/02/22 194 lb (88 kg)  06/08/22 191 lb 6.4 oz (86.8 kg)  04/02/22 192 lb 9.6 oz (87.4 kg)     GEN: Pleasant, elderly male appearing in no acute distress NECK: No JVD; No carotid bruits CARDIAC: Irregularly irregular, no murmurs, rubs, gallops RESPIRATORY:  Clear to auscultation without rales, wheezing or rhonchi  ABDOMEN: Appears non-distended. No obvious abdominal masses. EXTREMITIES: No clubbing  or cyanosis. No pitting edema.  Distal pedal pulses are 2+ bilaterally.   Assessment and Plan:   1. HFimpEF/NICM - His ejection fraction was previously at 20 to 25% in 2017 but had improved to 50 to 55% in 2020. We discussed a repeat echocardiogram but will hold off for now given no recent symptoms. He wishes to continue his current medical therapy given that he has overall been feeling well. Therefore, will continue Entresto 24-26 mg twice daily, Toprol-XL 25 mg twice daily and  Torsemide 20 mg twice daily. Recent labs in 03/2022 showed his creatinine was stable at 1.39 which is close to his known baseline.  2. Permanent Atrial Fibrillation - He denies any recent palpitations and his heart rate is well-controlled in the 80's during today's visit. Remains on Toprol-XL 25 mg twice daily for rate-control. - His CHA2DS2-VASc Score and unadjusted Ischemic Stroke Rate (% per year) is equal to 9.7 % stroke rate/year from a score of 6 (CHF, HTN, DM, Aortic Plaque, Age (2)). Prior notes mention that he has not been on anticoagulation due to history of GI bleeding and also due to history of hematuria. By review of the chart, he has not had bleeding issues since 2020 and he denies any recent issues with this. I will send a message to his PCP today to see if he would be in agreement with challenging the patient on Eliquis to see if he is able to tolerate this given no recent bleeding issues with plans for close follow-up CBC and BMET.   3. CHB/LBBB - His device is followed by Dr. Ladona Ridgel and most recent interrogation earlier this month showed normal device function with permanent atrial fibrillation and rates were overall well-controlled.  4. HTN - His blood pressure is well-controlled at 116/64 during today's visit. Continue current medical therapy with Entresto 24-26 mg twice daily and Toprol-XL 25 mg twice daily.  Signed, Ellsworth Lennox, PA-C

## 2022-09-04 NOTE — Progress Notes (Signed)
Remote pacemaker transmission.   

## 2022-09-11 ENCOUNTER — Telehealth: Payer: Self-pay | Admitting: Student

## 2022-09-11 DIAGNOSIS — I4821 Permanent atrial fibrillation: Secondary | ICD-10-CM

## 2022-09-11 MED ORDER — APIXABAN 5 MG PO TABS: 5.0000 mg | ORAL_TABLET | Freq: Two times a day (BID) | ORAL | 11 refills | Status: DC

## 2022-09-11 NOTE — Telephone Encounter (Signed)
Pt notified to start Eliquis 5 mg two times daily and have lab work done in 6-8 weeks.

## 2022-09-11 NOTE — Telephone Encounter (Signed)
    Please let the patient know that I did review his case with Dr. Gerda Diss and he was in agreement with trying Eliquis 5 mg twice daily given his elevated stroke risk. Would monitor for signs of bleeding such as blood in urine or stool. Would walk on flat surfaces and try to avoid inclines to reduce risk of falls. Would recommend obtaining a follow-up CBC and BMET in 6-8 weeks.   Signed, Ellsworth Lennox, PA-C 09/11/2022, 4:22 PM

## 2022-09-14 ENCOUNTER — Telehealth: Payer: Self-pay

## 2022-09-14 NOTE — Telephone Encounter (Signed)
Pt come by to ask a question and is concerned he went to see his heart doctor and seen the PA and she was looking at his chart mention blood in his stool he has not had nothing like that in years and he has had not problems with blood in his stool. Provider put him on Eliquis 5mg  and he don't feel that he needs this and wants Dr Lorin Picket advice his he needs to continue to take this?   Anthony Lucas [phone]

## 2022-09-17 NOTE — Telephone Encounter (Signed)
Need follow up response  

## 2022-09-18 NOTE — Telephone Encounter (Signed)
Pt come by to ask a question and is concerned he went to see his heart doctor and seen the PA and she was looking at his chart mention blood in his stool he has not had nothing like that in years and he has had not problems with blood in his stool. Provider put him on Eliquis 5mg  and he don't feel that he needs this and wants Dr Lorin Picket advice his he needs to continue to take this?

## 2022-09-20 NOTE — Telephone Encounter (Signed)
So the reason that cardiology would like for him to start Eliquis is because of his underlying condition puts him at a approximately 9% risk of having a stroke and utilizing Eliquis would cut this risk down to a little less than 2% risk.  There is the possibility that the blood thinner could cause bleeding as a side effect but the risk of that is not significantly elevated If you would like to do a follow-up office visit to discuss this in further detail I would be happy to see him (unfortunately schedule could be tight but we could do a visit at some point to be able to discuss this further if you would like to)

## 2022-09-22 NOTE — Telephone Encounter (Signed)
Patient has been advised per drs message below; patient also wanted to make sure he was to continue taking Entresto along with Eliquis.   So the reason that cardiology would like for him to start Eliquis is because of his underlying condition puts him at a approximately 9% risk of having a stroke and utilizing Eliquis would cut this risk down to a little less than 2% risk.   There is the possibility that the blood thinner could cause bleeding as a side effect but the risk of that is not significantly elevated If you would like to do a follow-up office visit to discuss this in further detail I would be happy to see him (unfortunately schedule could be tight but we could do a visit at some point to be able to discuss this further if you would like to)

## 2022-09-22 NOTE — Telephone Encounter (Signed)
The answer is yes He should keep his follow-up visits with cardiology And he should do a follow-up with [place] by phone time thank you

## 2022-09-23 NOTE — Telephone Encounter (Signed)
Tried calling to inform patient, no answer. Message sent via mychart.

## 2022-09-30 ENCOUNTER — Other Ambulatory Visit: Payer: Self-pay | Admitting: Urology

## 2022-09-30 DIAGNOSIS — R339 Retention of urine, unspecified: Secondary | ICD-10-CM

## 2022-10-01 ENCOUNTER — Ambulatory Visit: Payer: Medicare Other | Admitting: Family Medicine

## 2022-10-28 ENCOUNTER — Other Ambulatory Visit: Payer: Self-pay

## 2022-10-28 DIAGNOSIS — E1169 Type 2 diabetes mellitus with other specified complication: Secondary | ICD-10-CM

## 2022-10-28 MED ORDER — BLOOD GLUCOSE METER KIT
1.0000 | PACK | Freq: Three times a day (TID) | 5 refills | Status: AC
Start: 2022-10-28 — End: ?

## 2022-10-28 MED ORDER — BLOOD GLUCOSE METER KIT
1.0000 | PACK | Freq: Three times a day (TID) | 5 refills | Status: DC
Start: 2022-10-28 — End: 2022-10-28

## 2022-10-29 DIAGNOSIS — B351 Tinea unguium: Secondary | ICD-10-CM | POA: Diagnosis not present

## 2022-10-29 DIAGNOSIS — E1142 Type 2 diabetes mellitus with diabetic polyneuropathy: Secondary | ICD-10-CM | POA: Diagnosis not present

## 2022-11-13 ENCOUNTER — Telehealth: Payer: Self-pay

## 2022-11-13 NOTE — Telephone Encounter (Signed)
Pt is wanting to know if he can take melatonin with all the medications that he is on.  Elnita Maxwell [phone]

## 2022-11-16 ENCOUNTER — Other Ambulatory Visit: Payer: Self-pay | Admitting: Family Medicine

## 2022-11-16 NOTE — Telephone Encounter (Signed)
Called patient and advised per Dr Lorin Picket May utilize 3 or 5 mg of melatonin in the evening as needed. Patient verbalized understanding.

## 2022-11-16 NOTE — Telephone Encounter (Signed)
May utilize 3 or 5 mg of melatonin in the evening as needed

## 2022-11-17 ENCOUNTER — Ambulatory Visit (INDEPENDENT_AMBULATORY_CARE_PROVIDER_SITE_OTHER): Payer: Medicare Other

## 2022-11-17 ENCOUNTER — Other Ambulatory Visit: Payer: Self-pay

## 2022-11-17 DIAGNOSIS — I5022 Chronic systolic (congestive) heart failure: Secondary | ICD-10-CM

## 2022-11-17 LAB — CUP PACEART REMOTE DEVICE CHECK
Battery Remaining Longevity: 118 mo
Battery Voltage: 3.01 V
Brady Statistic RA Percent Paced: 0 %
Brady Statistic RV Percent Paced: 98.72 %
Date Time Interrogation Session: 20240813012114
Implantable Lead Connection Status: 753985
Implantable Lead Connection Status: 753985
Implantable Lead Connection Status: 753985
Implantable Lead Implant Date: 20170914
Implantable Lead Implant Date: 20170914
Implantable Lead Implant Date: 20220811
Implantable Lead Location: 753858
Implantable Lead Location: 753859
Implantable Lead Location: 753860
Implantable Lead Model: 3830
Implantable Lead Model: 5076
Implantable Lead Model: 5076
Implantable Pulse Generator Implant Date: 20220811
Lead Channel Impedance Value: 247 Ohm
Lead Channel Impedance Value: 285 Ohm
Lead Channel Impedance Value: 323 Ohm
Lead Channel Impedance Value: 361 Ohm
Lead Channel Impedance Value: 399 Ohm
Lead Channel Impedance Value: 456 Ohm
Lead Channel Impedance Value: 494 Ohm
Lead Channel Impedance Value: 513 Ohm
Lead Channel Impedance Value: 532 Ohm
Lead Channel Pacing Threshold Amplitude: 0.5 V
Lead Channel Pacing Threshold Amplitude: 0.5 V
Lead Channel Pacing Threshold Pulse Width: 0.4 ms
Lead Channel Pacing Threshold Pulse Width: 0.5 ms
Lead Channel Sensing Intrinsic Amplitude: 1.375 mV
Lead Channel Sensing Intrinsic Amplitude: 1.375 mV
Lead Channel Sensing Intrinsic Amplitude: 17.125 mV
Lead Channel Sensing Intrinsic Amplitude: 17.125 mV
Lead Channel Setting Pacing Amplitude: 1 V
Lead Channel Setting Pacing Amplitude: 2 V
Lead Channel Setting Pacing Pulse Width: 0.4 ms
Lead Channel Setting Pacing Pulse Width: 0.5 ms
Lead Channel Setting Sensing Sensitivity: 2.8 mV
Zone Setting Status: 755011
Zone Setting Status: 755011

## 2022-11-17 MED ORDER — ENTRESTO 24-26 MG PO TABS: 1.0000 | ORAL_TABLET | Freq: Two times a day (BID) | ORAL | 6 refills | Status: DC

## 2022-12-02 NOTE — Progress Notes (Signed)
Remote pacemaker transmission.   

## 2022-12-28 ENCOUNTER — Other Ambulatory Visit: Payer: Self-pay | Admitting: Urology

## 2022-12-28 DIAGNOSIS — R339 Retention of urine, unspecified: Secondary | ICD-10-CM

## 2023-01-01 ENCOUNTER — Ambulatory Visit (INDEPENDENT_AMBULATORY_CARE_PROVIDER_SITE_OTHER): Payer: Medicare Other

## 2023-01-01 ENCOUNTER — Other Ambulatory Visit: Payer: Self-pay

## 2023-01-01 VITALS — Ht 73.0 in | Wt 190.0 lb

## 2023-01-01 DIAGNOSIS — N390 Urinary tract infection, site not specified: Secondary | ICD-10-CM

## 2023-01-01 DIAGNOSIS — Z0000 Encounter for general adult medical examination without abnormal findings: Secondary | ICD-10-CM

## 2023-01-01 MED ORDER — NITROFURANTOIN MACROCRYSTAL 50 MG PO CAPS
50.0000 mg | ORAL_CAPSULE | Freq: Every day | ORAL | 0 refills | Status: DC
Start: 2023-01-01 — End: 2023-01-12

## 2023-01-01 NOTE — Patient Instructions (Signed)
[name] , Thank you for taking time to come for your Medicare Wellness Visit. I appreciate your ongoing commitment to your health goals. Please review the following plan we discussed and let me know if I can assist you in the future.   Referrals/Orders/Follow-Ups/Clinician Recommendations:  Next Medicare Annual Wellness Visit: January 07, 2024 at 1:10pm  With Dr. Gerda Diss: January 13, 2023 at 4:10pm  This is a list of the screening recommended for you and due dates:  Health Maintenance  Topic Date Due   DTaP/Tdap/Td vaccine (1 - Tdap) Never done   Zoster (Shingles) Vaccine (1 of 2) Never done   Eye exam for diabetics  01/15/2018   COVID-19 Vaccine (3 - Pfizer risk series) 02/29/2020   Hemoglobin A1C  10/02/2022   Flu Shot  11/05/2022   Complete foot exam   04/03/2023   Medicare Annual Wellness Visit  01/01/2024   Pneumonia Vaccine  Completed   HPV Vaccine  Aged Out    Advanced directives: (ACP Link)Information on Advanced Care Planning can be found at [facility] Advance Health Care Directives Advance Health Care Directives ([email or web address]  Advance Care Planning is important because it:  [x]  Makes sure you receive the medical care that is consistent with your values, goals, and preferences  [x]  It provides guidance to your family and loved ones and it also reduces their decisional burden about whether or not they are making the right decisions based on what you want done  Follow the link provided in your after visit summary or read over the paperwork we have mailed to you to help you started getting your Advance Directives in place. If you need assistance in completing these, please reach out to [place] so that we can help you!  Next Medicare Annual Wellness Visit scheduled for next year: Yes  Preventive Care 88 Years and Older, Male Preventive care refers to lifestyle choices and visits with your health care provider that can promote health and wellness. Preventive care  visits are also called wellness exams. What can I expect for my preventive care visit? Counseling During your preventive care visit, your health care provider may ask about your: Medical history, including: Past medical problems. Family medical history. History of falls. Current health, including: Emotional well-being. Home life and relationship well-being. Sexual activity. Memory and ability to understand (cognition). Lifestyle, including: Alcohol, nicotine or tobacco, and drug use. Access to firearms. Diet, exercise, and sleep habits. Work and work Astronomer. Sunscreen use. Safety issues such as seatbelt and bike helmet use. Physical exam Your health care provider will check your: Height and weight. These may be used to calculate your BMI (body mass index). BMI is a measurement that tells if you are at a healthy weight. Waist circumference. This measures the distance around your waistline. This measurement also tells if you are at a healthy weight and may help predict your risk of certain diseases, such as type 2 diabetes and high blood pressure. Heart rate and blood pressure. Body temperature. Skin for abnormal spots. What immunizations do I need?  Vaccines are usually given at various ages, according to a schedule. Your health care provider will recommend vaccines for you based on your age, medical history, and lifestyle or other factors, such as travel or where you work. What tests do I need? Screening Your health care provider may recommend screening tests for certain conditions. This may include: Lipid and cholesterol levels. Diabetes screening. This is done by checking your blood sugar (glucose) after  you have not eaten for a while (fasting). Hepatitis C test. Hepatitis B test. HIV (human immunodeficiency virus) test. STI (sexually transmitted infection) testing, if you are at risk. Lung cancer screening. Colorectal cancer screening. Prostate cancer  screening. Abdominal aortic aneurysm (AAA) screening. You may need this if you are a current or former smoker. Talk with your health care provider about your test results, treatment options, and if necessary, the need for more tests. Follow these instructions at home: Eating and drinking  Eat a diet that includes fresh fruits and vegetables, whole grains, lean protein, and low-fat dairy products. Limit your intake of foods with high amounts of sugar, saturated fats, and salt. Take vitamin and mineral supplements as recommended by your health care provider. Do not drink alcohol if your health care provider tells you not to drink. If you drink alcohol: Limit how much you have to 0-2 drinks a day. Know how much alcohol is in your drink. In the [place] one drink equals one 12 oz bottle of beer (355 mL), one 5 oz glass of wine (148 mL), or one 1 oz glass of hard liquor (44 mL). Lifestyle Brush your teeth every morning and night with fluoride toothpaste. Floss one time each day. Exercise for at least 30 minutes 5 or more days each week. Do not use any products that contain nicotine or tobacco. These products include cigarettes, chewing tobacco, and vaping devices, such as e-cigarettes. If you need help quitting, ask your health care provider. Do not use drugs. If you are sexually active, practice safe sex. Use a condom or other form of protection to prevent STIs. Take aspirin only as told by your health care provider. Make sure that you understand how much to take and what form to take. Work with your health care provider to find out whether it is safe and beneficial for you to take aspirin daily. Ask your health care provider if you need to take a cholesterol-lowering medicine (statin). Find healthy ways to manage stress, such as: Meditation, yoga, or listening to music. Journaling. Talking to a trusted person. Spending time with friends and family. Safety Always wear your seat belt while driving  or riding in a vehicle. Do not drive: If you have been drinking alcohol. Do not ride with someone who has been drinking. When you are tired or distracted. While texting. If you have been using any mind-altering substances or drugs. Wear a helmet and other protective equipment during sports activities. If you have firearms in your house, make sure you follow all gun safety procedures. Minimize exposure to UV radiation to reduce your risk of skin cancer. What's next? Visit your health care provider once a year for an annual wellness visit. Ask your health care provider how often you should have your eyes and teeth checked. Stay up to date on all vaccines. This information is not intended to replace advice given to you by your health care provider. Make sure you discuss any questions you have with your health care provider. Document Revised: 09/18/2020 Document Reviewed: 09/18/2020 Elsevier Patient Education  2024 [employer]. Understanding Your Risk for Falls Millions of people have serious injuries from falls each year. It is important to understand your risk of falling. Talk with your health care provider about your risk and what you can do to lower it. If you do have a serious fall, make sure to tell your provider. Falling once raises your risk of falling again. How can falls affect me? Serious injuries from  falls are common. These include: Broken bones, such as hip fractures. Head injuries, such as traumatic brain injuries (TBI) or concussions. A fear of falling can cause you to avoid activities and stay at home. This can make your muscles weaker and raise your risk for a fall. What can increase my risk? There are a number of risk factors that increase your risk for falling. The more risk factors you have, the higher your risk of falling. Serious injuries from a fall happen most often to people who are older than 87 years old. Teenagers and young adults ages 45-29 are also at higher  risk. Common risk factors include: Weakness in the lower body. Being generally weak or confused due to long-term (chronic) illness. Dizziness or balance problems. Poor vision. Medicines that cause dizziness or drowsiness. These may include: Medicines for your blood pressure, heart, anxiety, insomnia, or swelling (edema). Pain medicines. Muscle relaxants. Other risk factors include: Drinking alcohol. Having had a fall in the past. Having foot pain or wearing improper footwear. Working at a dangerous job. Having any of the following in your home: Tripping hazards, such as floor clutter or loose rugs. Poor lighting. Pets. Having dementia or memory loss. What actions can I take to lower my risk of falling?     Physical activity Stay physically fit. Do strength and balance exercises. Consider taking a regular class to build strength and balance. Yoga and tai chi are good options. Vision Have your eyes checked every year and your prescription for glasses or contacts updated as needed. Shoes and walking aids Wear non-skid shoes. Wear shoes that have rubber soles and low heels. Do not wear high heels. Do not walk around the house in socks or slippers. Use a cane or walker as told by your provider. Home safety Attach secure railings on both sides of your stairs. Install grab bars for your bathtub, shower, and toilet. Use a non-skid mat in your bathtub or shower. Attach bath mats securely with double-sided, non-slip rug tape. Use good lighting in all rooms. Keep a flashlight near your bed. Make sure there is a clear path from your bed to the bathroom. Use night-lights. Do not use throw rugs. Make sure all carpeting is taped or tacked down securely. Remove all clutter from walkways and stairways, including extension cords. Repair uneven or broken steps and floors. Avoid walking on icy or slippery surfaces. Walk on the grass instead of on icy or slick sidewalks. Use ice melter to get  rid of ice on walkways in the winter. Use a cordless phone. Questions to ask your health care provider Can you help me check my risk for a fall? Do any of my medicines make me more likely to fall? Should I take a vitamin D supplement? What exercises can I do to improve my strength and balance? Should I make an appointment to have my vision checked? Do I need a bone density test to check for weak bones (osteoporosis)? Would it help to use a cane or a walker? Where to find more information Centers for Disease Control and Prevention, STEAD[employer]: TonerPromos.no Community-Based Fall Prevention Programs: TonerPromos.no [employer] on Aging: BaseRingTones.pl Contact a health care provider if: You fall at home. You are afraid of falling at home. You feel weak, drowsy, or dizzy. This information is not intended to replace advice given to you by your health care provider. Make sure you discuss any questions you have with your health care provider. Document Revised: 11/24/2021 Document Reviewed: 11/24/2021 Elsevier  Patient Education  2024 [employer].

## 2023-01-01 NOTE — Progress Notes (Signed)
Because this visit was a virtual/telehealth visit,  certain criteria was not obtained, such a blood pressure, CBG if applicable, and timed get up and go. Any medications not marked as "taking" were not mentioned during the medication reconciliation part of the visit. Any vitals not documented were not able to be obtained due to this being a telehealth visit or patient was unable to self-report a recent blood pressure reading due to a lack of equipment at home via telehealth. Vitals that have been documented are verbally provided by the patient.   Subjective:   Anthony Lucas is a 89 y.o. male who presents for Medicare Annual/Subsequent preventive examination.  Visit Complete: Virtual  I connected with  Anthony Lucas on 01/01/23 by a audio enabled telemedicine application and verified that I am speaking with the correct person using two identifiers.  Patient Location: Home  Provider Location: Home Office  I discussed the limitations of evaluation and management by telemedicine. The patient expressed understanding and agreed to proceed.  Patient Medicare AWV questionnaire was completed by the patient on na; I have confirmed that all information answered by patient is correct and no changes since this date.  Cardiac Risk Factors include: advanced age (>72men, >68 women);diabetes mellitus;dyslipidemia;hypertension;sedentary lifestyle     Objective:    Today's Vitals   01/01/23 1527  Weight: 190 lb (86.2 kg)  Height: 6\' 1"  (1.854 m)   Body mass index is 25.07 kg/m.     01/01/2023    3:34 PM 04/12/2021    2:10 PM 11/14/2020   11:41 AM 08/10/2017    3:15 PM 12/19/2015    5:50 AM 12/08/2012    9:36 AM 09/08/2012    9:07 AM  Advanced Directives  Does Patient Have a Medical Advance Directive? No No Yes No No Patient does not have advance directive;Patient would not like information   Type of Advance Directive   Healthcare Power of Attorney      Would patient like information on creating a medical  advance directive? No - Patient declined   No - Patient declined No - patient declined information    Pre-existing out of facility DNR order (yellow form or pink MOST form)      No No    Current Medications (verified) Outpatient Encounter Medications as of 01/01/2023  Medication Sig   allopurinol (ZYLOPRIM) 100 MG tablet 2 qd   apixaban (ELIQUIS) 5 MG TABS tablet Take 1 tablet (5 mg total) by mouth 2 (two) times daily.   blood glucose meter kit and supplies 1 each by Other route 4 (four) times daily -  before meals and at bedtime.   clobetasol cream (TEMOVATE) 0.05 % Apply 1 application topically 2 (two) times daily as needed (burns on hands).   docusate sodium (COLACE) 100 MG capsule Take 100 mg by mouth daily.   finasteride (PROSCAR) 5 MG tablet TAKE ONE TABLET BY MOUTH ONCE DAILY   metFORMIN (GLUCOPHAGE) 500 MG tablet TAKE ONE TABLET BY MOUTH DAILY.   metoprolol succinate (TOPROL-XL) 25 MG 24 hr tablet Take 1 tablet (25 mg total) by mouth 2 (two) times daily.   nitrofurantoin (MACRODANTIN) 50 MG capsule Take 1 capsule (50 mg total) by mouth at bedtime.   ondansetron (ZOFRAN) 8 MG tablet Take 1 tablet (8 mg total) by mouth every 8 (eight) hours as needed for nausea.   potassium chloride (KLOR-CON) 10 MEQ tablet Take 10 mEq by mouth daily.   sacubitril-valsartan (ENTRESTO) 24-26 MG Take 1 tablet by mouth  2 (two) times daily.   silodosin (RAPAFLO) 8 MG CAPS capsule Take 1 capsule (8 mg total) by mouth in the morning and at bedtime.   simvastatin (ZOCOR) 40 MG tablet Take 1 tablet (40 mg total) by mouth daily.   torsemide (DEMADEX) 20 MG tablet Take 1 tablet (20 mg total) by mouth 2 (two) times daily.   No facility-administered encounter medications on file as of 01/01/2023.    Allergies (verified) Bee venom   History: Past Medical History:  Diagnosis Date   Arthritis    "right knee" (12/19/2015)   Chronic combined systolic (congestive) and diastolic (congestive) heart failure (HCC)     EF 50-55% in 2020 echo   Complete heart block (HCC)    S/P BiVPPM   History of colon polyps    History of gout    Hyperlipidemia    Hypertension    Myocardial infarction [facility])    "they said I'd had a heart attack but didn't know it" (12/19/2015).  No CAD on cath   Seasonal allergies    Skin cancer    "arms/hands" (12/19/2015)   Sleep apnea    supposed to wear CPAP, but doesn't (12/19/2015)   Type II diabetes mellitus [facility])    Past Surgical History:  Procedure Laterality Date   BIV PACEMAKER GENERATOR CHANGEOUT N/A 11/14/2020   Procedure: BIV PACEMAKER GENERATOR CHANGEOUT;  Surgeon: Marinus Maw, MD;  Location: [facility] INVASIVE CV LAB;  Service: Cardiovascular;  Laterality: N/A;   CATARACT EXTRACTION W/PHACO Right 09/08/2012   Procedure: CATARACT EXTRACTION PHACO AND INTRAOCULAR LENS PLACEMENT (IOC);  Surgeon: Gemma Payor, MD;  Location: [facility] ORS;  Service: Ophthalmology;  Laterality: Right;  CDE: 21.28   CATARACT EXTRACTION W/PHACO Left 12/08/2012   Procedure: CATARACT EXTRACTION PHACO AND INTRAOCULAR LENS PLACEMENT (IOC);  Surgeon: Gemma Payor, MD;  Location: [facility] ORS;  Service: Ophthalmology;  Laterality: Left;  CDE:25.72   CHOLECYSTECTOMY OPEN  1968   COLONOSCOPY     4 procedures   COLONOSCOPY  2012   Dr. Jarold Motto: diverticulosis, lipoma in ascending colon.    CYSTOSCOPY WITH FULGERATION N/A 08/11/2017   Procedure: CYSTOSCOPY WITH FULGERATION OF PROSTATIC VARICES;  Surgeon: Malen Gauze, MD;  Location: [facility] ORS;  Service: Urology;  Laterality: N/A;   EP IMPLANTABLE DEVICE N/A 12/19/2015   Procedure: BiV Pacemaker Insertion CRT-P;  Surgeon: Marinus Maw, MD;  [facility];  Service: Cardiovascular;  Laterality: N/A;   INSERT / REPLACE / REMOVE PACEMAKER  12/19/2015   JOINT REPLACEMENT Left    knee   LEAD REVISION/REPAIR N/A 11/14/2020   Procedure: LEAD REVISION/REPAIR;  Surgeon: Marinus Maw, MD;  Location: [facility] INVASIVE CV LAB;  Service: Cardiovascular;  Laterality: N/A;    POLYPECTOMY     TONSILLECTOMY     TOTAL KNEE ARTHROPLASTY Left 2000s   TRANSURETHRAL RESECTION OF PROSTATE     YAG LASER APPLICATION Right 06/16/2016   Procedure: YAG LASER APPLICATION;  Surgeon: Jethro Bolus, MD;  Location: [facility] ORS;  Service: Ophthalmology;  Laterality: Right;   YAG LASER APPLICATION Left 06/30/2016   Procedure: YAG LASER APPLICATION;  Surgeon: Jethro Bolus, MD;  Location: [facility] OR[facility];  Service: Ophthalmology;  Laterality: Left;   Family History  Problem Relation Age of Onset   Lung cancer Father    Colon cancer Neg Hx    Social History   Socioeconomic History   Marital status: Widowed    Spouse name: Not on file   Number of children: Not on file  Years of education: Not on file   Highest education level: Not on file  Occupational History   Not on file  Tobacco Use   Smoking status: Never   Smokeless tobacco: Never  Vaping Use   Vaping status: Never Used  Substance and Sexual Activity   Alcohol use: Not Currently    Alcohol/week: 2.0 standard drinks of alcohol    Types: 2 Glasses of wine per week   Drug use: No   Sexual activity: Not Currently    Birth control/protection: None  Other Topics Concern   Not on file  Social History Narrative   Not on file   Social Determinants of Health   Financial Resource Strain: Low Risk  (01/01/2023)   Overall Financial Resource Strain (CARDIA)    Difficulty of Paying Living Expenses: Not hard at all  Food Insecurity: No Food Insecurity (01/01/2023)   Hunger Vital Sign    Worried About Running Out of Food in the Last Year: Never true    Ran Out of Food in the Last Year: Never true  Transportation Needs: No Transportation Needs (01/01/2023)   PRAPARE - Administrator, Civil Service (Medical): No    Lack of Transportation (Non-Medical): No  Physical Activity: Inactive (01/01/2023)   Exercise Vital Sign    Days of Exercise per Week: 0 days    Minutes of Exercise per Session: 0 min  Stress: No Stress Concern  Present (01/01/2023)   [employer] of Occupational Health - Occupational Stress Questionnaire    Feeling of Stress : Not at all  Social Connections: Moderately Isolated (01/01/2023)   Social Connection and Isolation Panel [NHANES]    Frequency of Communication with Friends and Family: More than three times a week    Frequency of Social Gatherings with Friends and Family: More than three times a week    Attends Religious Services: More than 4 times per year    Active Member of [employer] or Organizations: No    Attends Banker Meetings: Never    Marital Status: Widowed    Tobacco Counseling Counseling given: Yes   Clinical Intake:  Pre-visit preparation completed: Yes  Pain : No/denies pain     BMI - recorded: 25.07 Nutritional Status: BMI 25 -29 Overweight Nutritional Risks: None Diabetes: Yes CBG done?: No (telehealth visit. unable to obtain cbg) Did pt. bring in CBG monitor from home?: No  How often do you need to have someone help you when you read instructions, pamphlets, or other written materials from your doctor or pharmacy?: 1 - Never  Interpreter Needed?: No  Information entered by :: Abby Arieliz Latino, CMA   Activities of Daily Living    01/01/2023    3:31 PM  In your present state of health, do you have any difficulty performing the following activities:  Hearing? 1  Comment sometimes  Vision? 0  Difficulty concentrating or making decisions? 0  Walking or climbing stairs? 1  Comment due to knee pain  Dressing or bathing? 0  Doing errands, shopping? 1  Comment if appt is in [place], he can drive himself, But if the appt is in [place] he has to have someone drive him.  Preparing Food and eating ? N  Using the Toilet? N  In the past six months, have you accidently leaked urine? N  Do you have problems with loss of bowel control? N  Managing your Medications? Y  Comment daughter helps  Managing your Finances? N  Housekeeping or managing  your Housekeeping? Y  Comment daughter/daughter in law help manage home    Patient Care Team: Babs Sciara, MD as PCP - General (Family Medicine) Marinus Maw, MD as PCP - Electrophysiology (Cardiology) Jonelle Sidle, MD as PCP - Cardiology (Cardiology) West Bali, MD (Inactive) as Consulting Physician (Gastroenterology)  Indicate any recent Medical Services you may have received from other than [facility] providers in the past year (date may be approximate).     Assessment:   This is a routine wellness examination for [facility].  Hearing/Vision screen Hearing Screening - Comments:: Patient has a little difficulty hearing but declines referral to audiology  Vision Screening - Comments:: Patient does not have an eye doctor and declines referral today    Goals Addressed             This Visit's Progress    Patient Stated       To continue being active and remain independent       Depression Screen    01/01/2023    3:35 PM 06/08/2022   11:45 AM 04/02/2022    8:35 AM 09/30/2021    8:14 AM 02/09/2020    2:41 PM 01/25/2018    6:33 PM 09/22/2016   10:11 AM  PHQ 2/9 Scores  PHQ - 2 Score 0 0 0 0 0 0 0  PHQ- 9 Score 0  4        Fall Risk    01/01/2023    3:35 PM 06/08/2022   11:44 AM 04/02/2022    8:35 AM 09/30/2021    8:10 AM 10/16/2019    2:37 PM  Fall Risk   Falls in the past year? 0 0 0 0 0  Number falls in past yr: 0  0 0   Injury with Fall? 0  0 0   Risk for fall due to : No Fall Risks;Impaired balance/gait;Impaired mobility;Orthopedic patient No Fall Risks  No Fall Risks Impaired mobility  Follow up Falls prevention discussed;Education provided Falls evaluation completed  Falls evaluation completed Falls evaluation completed    MEDICARE RISK AT HOME: Medicare Risk at Home Any stairs in or around the home?: No If so, are there any without handrails?: No Home free of loose throw rugs in walkways, pet beds, electrical cords, etc?: Yes Adequate lighting in  your home to reduce risk of falls?: Yes Life alert?: No Use of a cane, walker or w/c?: Yes Grab bars in the bathroom?: Yes Shower chair or bench in shower?: Yes Elevated toilet seat or a handicapped toilet?: Yes  TIMED UP AND GO:  Was the test performed?  No    Cognitive Function:        01/01/2023    3:35 PM  6CIT Screen  What Year? 0 points  What month? 0 points  What time? 0 points  Count back from 20 0 points  Months in reverse 0 points  Repeat phrase 0 points  Total Score 0 points    Immunizations Immunization History  Administered Date(s) Administered   Fluad Quad(high Dose 65+) 02/09/2020, 01/06/2021, 02/18/2022   Influenza, Quadrivalent, Recombinant, Inj, Pf 02/10/2019   Influenza,inj,Quad PF,6+ Mos 01/18/2014   Influenza-Unspecified 12/05/2012, 01/01/2015, 01/06/2016, 01/15/2017, 01/21/2018   Moderna SARS-COV2 Booster Vaccination 02/01/2020   PFIZER(Purple Top)SARS-COV-2 Vaccination 04/21/2019, 05/12/2019   Pneumococcal Conjugate-13 01/18/2014   Pneumococcal Polysaccharide-23 01/05/2000    TDAP status: Due, Education has been provided regarding the importance of this vaccine. Advised may receive this vaccine at local pharmacy or Health  Dept. Aware to provide a copy of the vaccination record if obtained from local pharmacy or Health Dept. Verbalized acceptance and understanding.  Flu Vaccine status: Due, Education has been provided regarding the importance of this vaccine. Advised may receive this vaccine at local pharmacy or Health Dept. Aware to provide a copy of the vaccination record if obtained from local pharmacy or Health Dept. Verbalized acceptance and understanding.  Pneumococcal vaccine status: Up to date  Covid-19 vaccine status: Information provided on how to obtain vaccines.   Qualifies for Shingles Vaccine? Yes   Zostavax completed No   Shingrix Completed?: No.    Education has been provided regarding the importance of this vaccine. Patient has  been advised to call insurance company to determine out of pocket expense if they have not yet received this vaccine. Advised may also receive vaccine at local pharmacy or Health Dept. Verbalized acceptance and understanding.  Screening Tests Health Maintenance  Topic Date Due   DTaP/Tdap/Td (1 - Tdap) Never done   Zoster Vaccines- Shingrix (1 of 2) Never done   OPHTHALMOLOGY EXAM  01/15/2018   COVID-19 Vaccine (3 - Pfizer risk series) 02/29/2020   Medicare Annual Wellness (AWV)  02/08/2021   HEMOGLOBIN A1C  10/02/2022   INFLUENZA VACCINE  11/05/2022   FOOT EXAM  04/03/2023   Pneumonia Vaccine 48+ Years old  Completed   HPV VACCINES  Aged Out    Health Maintenance  Health Maintenance Due  Topic Date Due   DTaP/Tdap/Td (1 - Tdap) Never done   Zoster Vaccines- Shingrix (1 of 2) Never done   OPHTHALMOLOGY EXAM  01/15/2018   COVID-19 Vaccine (3 - Pfizer risk series) 02/29/2020   Medicare Annual Wellness (AWV)  02/08/2021   HEMOGLOBIN A1C  10/02/2022   INFLUENZA VACCINE  11/05/2022    Colorectal cancer screening: No longer required.   Lung Cancer Screening: (Low Dose CT Chest recommended if Age 23-80 years, 20 pack-year currently smoking OR have quit w/in 15years.) does not qualify.   Lung Cancer Screening Referral: na  Additional Screening:  Hepatitis C Screening: does not qualify;   Vision Screening: Recommended annual ophthalmology exams for early detection of glaucoma and other disorders of the eye. Is the patient up to date with their annual eye exam?  No  Who is the provider or what is the name of the office in which the patient attends annual eye exams? N/a If pt is not established with a provider, would they like to be referred to a provider to establish care? No .   Dental Screening: Recommended annual dental exams for proper oral hygiene  Diabetic Foot Exam: Diabetic Foot Exam: Completed 04/02/2022  Community Resource Referral / Chronic Care Management: CRR  required this visit?  No   CCM required this visit?  No     Plan:     I have personally reviewed and noted the following in the patient's chart:   Medical and social history Use of alcohol, tobacco or illicit drugs  Current medications and supplements including opioid prescriptions. Patient is not currently taking opioid prescriptions. Functional ability and status Nutritional status Physical activity Advanced directives List of other physicians Hospitalizations, surgeries, and ER visits in previous 12 months Vitals Screenings to include cognitive, depression, and falls Referrals and appointments  In addition, I have reviewed and discussed with patient certain preventive protocols, quality metrics, and best practice recommendations. A written personalized care plan for preventive services as well as general preventive health recommendations were provided to patient.  Jordan Hawks Stephanieann Popescu, CMA   01/01/2023   After Visit Summary: (Mail) Due to this being a telephonic visit, the after visit summary with patients personalized plan was offered to patient via mail   Nurse Notes: Patient scheduled for routine follow up for 01/13/2023

## 2023-01-04 ENCOUNTER — Other Ambulatory Visit: Payer: Self-pay | Admitting: Urology

## 2023-01-04 DIAGNOSIS — R339 Retention of urine, unspecified: Secondary | ICD-10-CM

## 2023-01-07 DIAGNOSIS — E1142 Type 2 diabetes mellitus with diabetic polyneuropathy: Secondary | ICD-10-CM | POA: Diagnosis not present

## 2023-01-07 DIAGNOSIS — B351 Tinea unguium: Secondary | ICD-10-CM | POA: Diagnosis not present

## 2023-01-07 NOTE — Progress Notes (Signed)
Name: Anthony Lucas DOB: 11-Aug-1930 MRN: [id number]  History of Present Illness: Mr. Krahl is a 89 y.o. male who presents today for follow up visit at [facility] [place]. - GU history: 1. Prostate cancer.  2. Incomplete bladder emptying. - Taking Rapaflo 8 mg BID and Proscar 5 mg daily. 3. Recurrent UTI.  - Likely secondary to #2. - Taking Nitrofurantoin 50 mg daily for UTI prophylaxis. 4. Nocturnal enuresis.  At last visit with Dr. Ronne Binning on 12/24/2021: - PVR = 557 ml. - The plan was to continue Rapaflo, Proscar, and Nitrofurantoin 50 mg daily.  Today: He reports nocturia x3-4 and nocturnal enuresis; wears Depends overnight. Denies daytime urinary incontinence, urgency, or frequency. Denies dysuria, gross hematuria, straining to void, or sensations of incomplete emptying. He denies flank pain or abdominal pain. He denies any UTIs in the past year.    Fall Screening: Do you usually have a device to assist in your mobility? No   Medications: Current Outpatient Medications  Medication Sig Dispense Refill   allopurinol (ZYLOPRIM) 100 MG tablet 2 qd 60 tablet 6   apixaban (ELIQUIS) 5 MG TABS tablet Take 1 tablet (5 mg total) by mouth 2 (two) times daily. 60 tablet 11   blood glucose meter kit and supplies 1 each by Other route 4 (four) times daily -  before meals and at bedtime. 1 each 5   clobetasol cream (TEMOVATE) 0.05 % Apply 1 application topically 2 (two) times daily as needed (burns on hands).     docusate sodium (COLACE) 100 MG capsule Take 100 mg by mouth daily.     finasteride (PROSCAR) 5 MG tablet TAKE ONE TABLET BY MOUTH ONCE DAILY 90 tablet 3   metFORMIN (GLUCOPHAGE) 500 MG tablet TAKE ONE TABLET BY MOUTH DAILY. 90 tablet 0   metoprolol succinate (TOPROL-XL) 25 MG 24 hr tablet Take 1 tablet (25 mg total) by mouth 2 (two) times daily. 180 tablet 3   nitrofurantoin (MACRODANTIN) 50 MG capsule TAKE 1 CAPSULE BY MOUTH ONCE AT BEDTIME. 30 capsule 11    ondansetron (ZOFRAN) 8 MG tablet Take 1 tablet (8 mg total) by mouth every 8 (eight) hours as needed for nausea. 15 tablet 0   potassium chloride (KLOR-CON) 10 MEQ tablet Take 10 mEq by mouth daily.     sacubitril-valsartan (ENTRESTO) 24-26 MG Take 1 tablet by mouth 2 (two) times daily. 60 tablet 6   silodosin (RAPAFLO) 8 MG CAPS capsule TAKE (1) CAPSULE BY MOUTH IN THE MORNING AND AT BEDTIME 60 capsule 11   simvastatin (ZOCOR) 40 MG tablet Take 1 tablet (40 mg total) by mouth daily. 90 tablet 3   torsemide (DEMADEX) 20 MG tablet Take 1 tablet (20 mg total) by mouth 2 (two) times daily. 180 tablet 3   No current facility-administered medications for this visit.    Allergies: Allergies  Allergen Reactions   Bee Venom Anaphylaxis    Honey bee     Past Medical History:  Diagnosis Date   Arthritis    "right knee" (12/19/2015)   Chronic combined systolic (congestive) and diastolic (congestive) heart failure (HCC)    EF 50-55% in 2020 echo   Complete heart block (HCC)    S/P BiVPPM   History of colon polyps    History of gout    Hyperlipidemia    Hypertension    Myocardial infarction [facility])    "they said I'd had a heart attack but didn't know it" (12/19/2015).  No CAD on cath  Seasonal allergies    Skin cancer    "arms/hands" (12/19/2015)   Sleep apnea    supposed to wear CPAP, but doesn't (12/19/2015)   Type II diabetes mellitus [facility])    Past Surgical History:  Procedure Laterality Date   BIV PACEMAKER GENERATOR CHANGEOUT N/A 11/14/2020   Procedure: BIV PACEMAKER GENERATOR CHANGEOUT;  Surgeon: Marinus Maw, MD;  Location: [facility] INVASIVE CV LAB;  Service: Cardiovascular;  Laterality: N/A;   CATARACT EXTRACTION W/PHACO Right 09/08/2012   Procedure: CATARACT EXTRACTION PHACO AND INTRAOCULAR LENS PLACEMENT (IOC);  Surgeon: Gemma Payor, MD;  Location: [facility] ORS;  Service: Ophthalmology;  Laterality: Right;  CDE: 21.28   CATARACT EXTRACTION W/PHACO Left 12/08/2012   Procedure: CATARACT  EXTRACTION PHACO AND INTRAOCULAR LENS PLACEMENT (IOC);  Surgeon: Gemma Payor, MD;  Location: [facility] ORS;  Service: Ophthalmology;  Laterality: Left;  CDE:25.72   CHOLECYSTECTOMY OPEN  1968   COLONOSCOPY     4 procedures   COLONOSCOPY  2012   Dr. Jarold Motto: diverticulosis, lipoma in ascending colon.    CYSTOSCOPY WITH FULGERATION N/A 08/11/2017   Procedure: CYSTOSCOPY WITH FULGERATION OF PROSTATIC VARICES;  Surgeon: Malen Gauze, MD;  Location: [facility] ORS;  Service: Urology;  Laterality: N/A;   EP IMPLANTABLE DEVICE N/A 12/19/2015   Procedure: BiV Pacemaker Insertion CRT-P;  Surgeon: Marinus Maw, MD;  [facility] INVASIVE CV LAB;  Service: Cardiovascular;  Laterality: N/A;   INSERT / REPLACE / REMOVE PACEMAKER  12/19/2015   JOINT REPLACEMENT Left    knee   LEAD REVISION/REPAIR N/A 11/14/2020   Procedure: LEAD REVISION/REPAIR;  Surgeon: Marinus Maw, MD;  Location: [facility] INVASIVE CV LAB;  Service: Cardiovascular;  Laterality: N/A;   POLYPECTOMY     TONSILLECTOMY     TOTAL KNEE ARTHROPLASTY Left 2000s   TRANSURETHRAL RESECTION OF PROSTATE     YAG LASER APPLICATION Right 06/16/2016   Procedure: YAG LASER APPLICATION;  Surgeon: Jethro Bolus, MD;  Location: [facility];  Service: Ophthalmology;  Laterality: Right;   YAG LASER APPLICATION Left 06/30/2016   Procedure: YAG LASER APPLICATION;  Surgeon: Jethro Bolus, MD;  Location: [facility];  Service: Ophthalmology;  Laterality: Left;   Family History  Problem Relation Age of Onset   Lung cancer Father    Colon cancer Neg Hx    Social History   Socioeconomic History   Marital status: Widowed    Spouse name: Not on file   Number of children: Not on file   Years of education: Not on file   Highest education level: Not on file  Occupational History   Not on file  Tobacco Use   Smoking status: Never   Smokeless tobacco: Never  Vaping Use   Vaping status: Never Used  Substance and Sexual Activity   Alcohol use: Not Currently    Alcohol/week: 2.0  standard drinks of alcohol    Types: 2 Glasses of wine per week   Drug use: No   Sexual activity: Not Currently    Birth control/protection: None  Other Topics Concern   Not on file  Social History Narrative   Not on file   Social Determinants of Health   Financial Resource Strain: Low Risk  (01/01/2023)   Overall Financial Resource Strain (CARDIA)    Difficulty of Paying Living Expenses: Not hard at all  Food Insecurity: No Food Insecurity (01/01/2023)   Hunger Vital Sign    Worried About Running Out of Food in the Last Year: Never true    Ran  Out of Food in the Last Year: Never true  Transportation Needs: No Transportation Needs (01/01/2023)   PRAPARE - Administrator, Civil Service (Medical): No    Lack of Transportation (Non-Medical): No  Physical Activity: Inactive (01/01/2023)   Exercise Vital Sign    Days of Exercise per Week: 0 days    Minutes of Exercise per Session: 0 min  Stress: No Stress Concern Present (01/01/2023)   [employer] of Occupational Health - Occupational Stress Questionnaire    Feeling of Stress : Not at all  Social Connections: Moderately Isolated (01/01/2023)   Social Connection and Isolation Panel [NHANES]    Frequency of Communication with Friends and Family: More than three times a week    Frequency of Social Gatherings with Friends and Family: More than three times a week    Attends Religious Services: More than 4 times per year    Active Member of [employer] or Organizations: No    Attends Banker Meetings: Never    Marital Status: Widowed  Intimate Partner Violence: Not At Risk (01/01/2023)   Humiliation, Afraid, Rape, and Kick questionnaire    Fear of Current or Ex-Partner: No    Emotionally Abused: No    Physically Abused: No    Sexually Abused: No    Review of Systems Constitutional: Patient denies any unintentional weight loss or change in strength lntegumentary: Patient denies any rashes or  pruritus Cardiovascular: Patient denies chest pain or syncope Respiratory: Patient denies shortness of breath Gastrointestinal: Patient denies nausea, vomiting, constipation, or diarrhea Musculoskeletal: Patient denies muscle cramps or weakness Neurologic: Patient denies convulsions or seizures Allergic/Immunologic: Patient denies recent allergic reaction(s) Hematologic/Lymphatic: Patient denies bleeding tendencies Endocrine: Patient denies heat/cold intolerance  GU: As per HPI.  OBJECTIVE Vitals:   01/13/23 1402  BP: 113/67  Pulse: 81  Temp: 97.9 F (36.6 C)   There is no height or weight on file to calculate BMI.  Physical Examination Constitutional: No obvious distress; patient is non-toxic appearing  Cardiovascular: No visible lower extremity edema.  Respiratory: The patient does not have audible wheezing/stridor; respirations do not appear labored  Gastrointestinal: Abdomen non-distended Musculoskeletal: Normal ROM of UEs  Skin: No obvious rashes/open sores  Neurologic: CN 2-12 grossly intact Psychiatric: Answered questions appropriately with normal affect  Hematologic/Lymphatic/Immunologic: No obvious bruises or sites of spontaneous bleeding  ASSESSMENT Benign prostatic hyperplasia with incomplete bladder emptying  Chronic cystitis  PROSTATE CANCER  Incomplete emptying of bladder  Urinary retention  He is doing well. We discussed option to obtain RUS to screen for hydronephrosis; he declined. Renal function has been relatively stable over the past 1-2 years per chart review. Advised to continue current medications as per Dr. Ronne Binning, which patient states have been beneficial. Will plan for follow up in 1 year or sooner if needed. Pt verbalized understanding and agreement. All questions were answered.  PLAN Advised the following: 1. Continue Rapaflo 8 mg BID and Proscar 5 mg daily for BPH. 2. Continue Nitrofurantoin 50 mg daily for UTI prophylaxis. 3. Return in  about 1 year (around 01/13/2024) for UA, PVR, & f/u with Evette Georges NP.  No orders of the defined types were placed in this encounter.   It has been explained that the patient is to follow regularly with their PCP in addition to all other providers involved in their care and to follow instructions provided by these respective offices. Patient advised to contact urology clinic if any urologic-pertaining questions, concerns, new symptoms or  problems arise in the interim period.  There are no Patient Instructions on file for this visit.  Electronically signed by:  Donnita Falls, FNP   01/13/23    2:49 PM

## 2023-01-08 ENCOUNTER — Other Ambulatory Visit: Payer: Self-pay | Admitting: Urology

## 2023-01-08 DIAGNOSIS — N390 Urinary tract infection, site not specified: Secondary | ICD-10-CM

## 2023-01-13 ENCOUNTER — Ambulatory Visit (INDEPENDENT_AMBULATORY_CARE_PROVIDER_SITE_OTHER): Payer: Medicare Other | Admitting: Urology

## 2023-01-13 ENCOUNTER — Ambulatory Visit: Payer: Medicare Other | Admitting: Family Medicine

## 2023-01-13 ENCOUNTER — Encounter: Payer: Self-pay | Admitting: Urology

## 2023-01-13 VITALS — BP 113/67 | HR 81 | Temp 97.9°F

## 2023-01-13 DIAGNOSIS — R339 Retention of urine, unspecified: Secondary | ICD-10-CM

## 2023-01-13 DIAGNOSIS — N401 Enlarged prostate with lower urinary tract symptoms: Secondary | ICD-10-CM | POA: Diagnosis not present

## 2023-01-13 DIAGNOSIS — C61 Malignant neoplasm of prostate: Secondary | ICD-10-CM

## 2023-01-13 DIAGNOSIS — N3020 Other chronic cystitis without hematuria: Secondary | ICD-10-CM | POA: Diagnosis not present

## 2023-01-13 DIAGNOSIS — N3021 Other chronic cystitis with hematuria: Secondary | ICD-10-CM

## 2023-02-02 ENCOUNTER — Ambulatory Visit: Payer: Medicare Other

## 2023-02-02 DIAGNOSIS — Z23 Encounter for immunization: Secondary | ICD-10-CM | POA: Diagnosis not present

## 2023-02-03 DIAGNOSIS — Z23 Encounter for immunization: Secondary | ICD-10-CM | POA: Diagnosis not present

## 2023-02-13 ENCOUNTER — Other Ambulatory Visit: Payer: Self-pay | Admitting: Family Medicine

## 2023-02-16 ENCOUNTER — Ambulatory Visit: Payer: Medicare Other

## 2023-02-16 DIAGNOSIS — I442 Atrioventricular block, complete: Secondary | ICD-10-CM | POA: Diagnosis not present

## 2023-02-18 LAB — CUP PACEART REMOTE DEVICE CHECK
Battery Remaining Longevity: 112 mo
Battery Voltage: 3.01 V
Brady Statistic RA Percent Paced: 0 %
Brady Statistic RV Percent Paced: 99.06 %
Date Time Interrogation Session: 20241113081908
Implantable Lead Connection Status: 753985
Implantable Lead Connection Status: 753985
Implantable Lead Connection Status: 753985
Implantable Lead Implant Date: 20170914
Implantable Lead Implant Date: 20170914
Implantable Lead Implant Date: 20220811
Implantable Lead Location: 753858
Implantable Lead Location: 753859
Implantable Lead Location: 753860
Implantable Lead Model: 3830
Implantable Lead Model: 5076
Implantable Lead Model: 5076
Implantable Pulse Generator Implant Date: 20220811
Lead Channel Impedance Value: 247 Ohm
Lead Channel Impedance Value: 285 Ohm
Lead Channel Impedance Value: 342 Ohm
Lead Channel Impedance Value: 361 Ohm
Lead Channel Impedance Value: 418 Ohm
Lead Channel Impedance Value: 456 Ohm
Lead Channel Impedance Value: 475 Ohm
Lead Channel Impedance Value: 494 Ohm
Lead Channel Impedance Value: 513 Ohm
Lead Channel Pacing Threshold Amplitude: 0.625 V
Lead Channel Pacing Threshold Amplitude: 0.625 V
Lead Channel Pacing Threshold Pulse Width: 0.4 ms
Lead Channel Pacing Threshold Pulse Width: 0.5 ms
Lead Channel Sensing Intrinsic Amplitude: 1 mV
Lead Channel Sensing Intrinsic Amplitude: 1 mV
Lead Channel Sensing Intrinsic Amplitude: 8.375 mV
Lead Channel Sensing Intrinsic Amplitude: 8.375 mV
Lead Channel Setting Pacing Amplitude: 1.25 V
Lead Channel Setting Pacing Amplitude: 2 V
Lead Channel Setting Pacing Pulse Width: 0.4 ms
Lead Channel Setting Pacing Pulse Width: 0.5 ms
Lead Channel Setting Sensing Sensitivity: 2.8 mV
Zone Setting Status: 755011
Zone Setting Status: 755011

## 2023-02-23 ENCOUNTER — Ambulatory Visit: Payer: Medicare Other | Admitting: Family Medicine

## 2023-02-23 ENCOUNTER — Encounter: Payer: Self-pay | Admitting: Family Medicine

## 2023-02-23 VITALS — BP 110/70 | HR 77 | Temp 97.9°F | Ht 73.0 in | Wt 195.2 lb

## 2023-02-23 DIAGNOSIS — R55 Syncope and collapse: Secondary | ICD-10-CM | POA: Diagnosis not present

## 2023-02-23 DIAGNOSIS — I10 Essential (primary) hypertension: Secondary | ICD-10-CM

## 2023-02-23 DIAGNOSIS — E1169 Type 2 diabetes mellitus with other specified complication: Secondary | ICD-10-CM

## 2023-02-23 DIAGNOSIS — I509 Heart failure, unspecified: Secondary | ICD-10-CM

## 2023-02-23 DIAGNOSIS — Z79899 Other long term (current) drug therapy: Secondary | ICD-10-CM | POA: Diagnosis not present

## 2023-02-23 DIAGNOSIS — R42 Dizziness and giddiness: Secondary | ICD-10-CM

## 2023-02-23 DIAGNOSIS — E785 Hyperlipidemia, unspecified: Secondary | ICD-10-CM | POA: Diagnosis not present

## 2023-02-23 NOTE — Progress Notes (Signed)
Subjective:    Patient ID: Anthony Lucas, male    DOB: [birth year], 89 y.o.   MRN: [id number]   History of Present Illness   The patient, with a history of cardiac disease and a pacemaker, presents with two recent episodes of syncope. The first episode occurred about a month ago when he stood up quickly and felt lightheaded, leading to a fall and head injury. The second episode was similar, occurring when he stood up to change the television channel. Both times, he regained consciousness on the floor. He denies any associated chest pain or palpitations.  The patient also reports frequent urination, with no control over it, leading to multiple changes of adult diapers during the night. He also mentions a persistent sore knee that affects his mobility.  The patient lives with his daughter and son-in-law and is still able to drive locally during the day. He maintains a social life, going out for meals and attending church. His mood and energy levels are generally good, although he acknowledges that his energy may be lower due to age.  The patient's main concern is whether his syncope episodes could be due to his heart stopping and the pacemaker not kicking in quickly enough. He is also concerned about his lack of urinary control.         Review of Systems     Objective:    Physical Exam   VITALS: BP- 110/70 sitting, drops upon standing. Pulse normal. EXTREMITIES: No abnormalities noted on inspection. NEUROLOGICAL: No abnormalities noted.           Assessment & Plan:  Assessment and Plan    Syncope Two episodes of syncope upon standing. Blood pressure drops upon standing, suggesting orthostatic hypotension as a possible cause. Patient has a pacemaker and there is a concern about possible cardiac etiology. -Perform EKG today. -Consult with Dr. Ladona Ridgel (cardiologist) regarding the episodes of syncope and possible cardiac etiology. -Advise patient to move legs before standing and take  a few steps before walking to help prevent orthostatic hypotension.  Urinary Incontinence Frequent urination and lack of control, requiring use of pads. Changes pads 2-3 times per night. -Order metabolic panel, CBC, cholesterol profile, liver function tests, and A1c to assess overall health and possible contributing factors to urinary incontinence. -Schedule CT scan.  Follow-up in 3 months.     1. Postural dizziness with presyncope Blood pressure does drop a little bit when he stands but it is not severe but I suspect that that is part of the culprit behind what is going on  2. Syncope, unspecified syncope type Patient describes 1 spell where he truly passed out and fell to the floor another time he felt woozy dizzy and collapsed to the floor Both of these occurred after sitting on the couch for period of time when he got up to work on the thermostat I suspect that this is most likely orthostatic hypotension triggering these issues Patient was concerned that there could be underlying heart related issue but both of these episodes fit with more of a orthostatic event rather than a true arrhythmia event-we will touch base with his rhythm cardiologist - EKG 12-Lead - CBC with Differential  3. Controlled type 2 diabetes mellitus with other specified complication, without long-term current use of insulin (HCC) Due for lab work check labs - Hemoglobin A1c  4. Hyperlipidemia associated with type 2 diabetes mellitus (HCC) Due for labs check labs healthy diet continue meds - Lipid Panel  5. Congestive  heart failure, unspecified HF chronicity, unspecified heart failure type (HCC) Continue current medications.  Will touch base with his cardiologist to see if they recommend any adjustments in medicine he is on multiple medicines for heart failure that can drop blood pressure but he is also on Rapaflo which can drop blood pressure if cardiology does not feel any medicines can be adjusted downward  safely I would recommend reducing the Rapaflo from 8 mg down to a 0.4 mg tamsulosin  6. Essential hypertension Blood pressure 110/70 sitting with standing 100/62 - Basic Metabolic Panel  7. Dizziness More than likely orthostatic related  8. High risk medication use Check labs - Hepatic Function Panel

## 2023-02-25 DIAGNOSIS — I10 Essential (primary) hypertension: Secondary | ICD-10-CM | POA: Diagnosis not present

## 2023-02-25 DIAGNOSIS — R55 Syncope and collapse: Secondary | ICD-10-CM | POA: Diagnosis not present

## 2023-02-25 DIAGNOSIS — Z79899 Other long term (current) drug therapy: Secondary | ICD-10-CM | POA: Diagnosis not present

## 2023-02-25 DIAGNOSIS — E1169 Type 2 diabetes mellitus with other specified complication: Secondary | ICD-10-CM | POA: Diagnosis not present

## 2023-02-25 DIAGNOSIS — E785 Hyperlipidemia, unspecified: Secondary | ICD-10-CM | POA: Diagnosis not present

## 2023-02-26 LAB — BASIC METABOLIC PANEL
BUN/Creatinine Ratio: 13 (ref 10–24)
BUN: 20 mg/dL (ref 8–27)
CO2: 22 mmol/L (ref 20–29)
Calcium: 9.1 mg/dL (ref 8.6–10.2)
Chloride: 102 mmol/L (ref 96–106)
Creatinine, Ser: 1.51 mg/dL — ABNORMAL HIGH (ref 0.76–1.27)
Glucose: 178 mg/dL — ABNORMAL HIGH (ref 70–99)
Potassium: 3.8 mmol/L (ref 3.5–5.2)
Sodium: 141 mmol/L (ref 134–144)
eGFR: 44 mL/min/{1.73_m2} — ABNORMAL LOW (ref >59)

## 2023-02-26 LAB — HEPATIC FUNCTION PANEL
ALT: 20 [IU]/L (ref 0–44)
AST: 16 [IU]/L (ref 0–40)
Albumin: 4 g/dL (ref 3.7–4.7)
Alkaline Phosphatase: 68 [IU]/L (ref 44–121)
Bilirubin Total: 0.5 mg/dL (ref 0.0–1.2)
Bilirubin, Direct: 0.19 mg/dL (ref 0.00–0.40)
Total Protein: 6.9 g/dL (ref 6.0–8.5)

## 2023-02-26 LAB — CBC WITH DIFFERENTIAL/PLATELET
Basophils Absolute: 0.1 10*3/uL (ref 0.0–0.2)
Basos: 1 %
EOS (ABSOLUTE): 0.6 10*3/uL — ABNORMAL HIGH (ref 0.0–0.4)
Eos: 8 %
Hematocrit: 39.6 % (ref 37.5–51.0)
Hemoglobin: 12.5 g/dL — ABNORMAL LOW (ref 13.0–17.7)
Immature Grans (Abs): 0 10*3/uL (ref 0.0–0.1)
Immature Granulocytes: 0 %
Lymphocytes Absolute: 0.9 10*3/uL (ref 0.7–3.1)
Lymphs: 13 %
MCH: 28.2 pg (ref 26.6–33.0)
MCHC: 31.6 g/dL (ref 31.5–35.7)
MCV: 89 fL (ref 79–97)
Monocytes Absolute: 0.7 10*3/uL (ref 0.1–0.9)
Monocytes: 9 %
Neutrophils Absolute: 4.8 10*3/uL (ref 1.4–7.0)
Neutrophils: 69 %
Platelets: 161 10*3/uL (ref 150–450)
RBC: 4.44 x10E6/uL (ref 4.14–5.80)
RDW: 13 % (ref 11.6–15.4)
WBC: 6.9 10*3/uL (ref 3.4–10.8)

## 2023-02-26 LAB — LIPID PANEL
Chol/HDL Ratio: 2.7 ratio (ref 0.0–5.0)
Cholesterol, Total: 126 mg/dL (ref 100–199)
HDL: 47 mg/dL (ref >39)
LDL Chol Calc (NIH): 60 mg/dL (ref 0–99)
Triglycerides: 103 mg/dL (ref 0–149)
VLDL Cholesterol Cal: 19 mg/dL (ref 5–40)

## 2023-02-26 LAB — HEMOGLOBIN A1C
Est. average glucose Bld gHb Est-mCnc: 151 mg/dL
Hgb A1c MFr Bld: 6.9 % — ABNORMAL HIGH (ref 4.8–5.6)

## 2023-03-02 ENCOUNTER — Encounter: Payer: Self-pay | Admitting: Student

## 2023-03-02 ENCOUNTER — Other Ambulatory Visit: Payer: Self-pay

## 2023-03-02 ENCOUNTER — Ambulatory Visit: Payer: Medicare Other | Attending: Student | Admitting: Student

## 2023-03-02 VITALS — BP 106/58 | HR 80 | Ht 73.0 in | Wt 193.0 lb

## 2023-03-02 DIAGNOSIS — Z79899 Other long term (current) drug therapy: Secondary | ICD-10-CM | POA: Insufficient documentation

## 2023-03-02 DIAGNOSIS — I10 Essential (primary) hypertension: Secondary | ICD-10-CM | POA: Insufficient documentation

## 2023-03-02 DIAGNOSIS — I5032 Chronic diastolic (congestive) heart failure: Secondary | ICD-10-CM | POA: Diagnosis not present

## 2023-03-02 DIAGNOSIS — I442 Atrioventricular block, complete: Secondary | ICD-10-CM | POA: Insufficient documentation

## 2023-03-02 DIAGNOSIS — I5022 Chronic systolic (congestive) heart failure: Secondary | ICD-10-CM

## 2023-03-02 DIAGNOSIS — I4821 Permanent atrial fibrillation: Secondary | ICD-10-CM | POA: Diagnosis not present

## 2023-03-02 DIAGNOSIS — R55 Syncope and collapse: Secondary | ICD-10-CM | POA: Diagnosis not present

## 2023-03-02 MED ORDER — TORSEMIDE 20 MG PO TABS: 20.0000 mg | ORAL_TABLET | Freq: Two times a day (BID) | ORAL | 1 refills | Status: DC

## 2023-03-02 MED ORDER — TORSEMIDE 20 MG PO TABS: 20.0000 mg | ORAL_TABLET | Freq: Every day | ORAL | 3 refills | Status: DC

## 2023-03-02 NOTE — Progress Notes (Signed)
Cardiology Office Note    Date:  03/02/2023  ID:  IVORY THORMAN, DOB 12/13/1930, MRN [id number] Cardiologist: Nona Dell, MD    History of Present Illness:    Anthony Lucas is a 89 y.o. 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  He was examined by myself in 08/2022 and reported overall doing well at that time and denied any recent anginal symptoms. He was continued on his current medical therapy with Entresto 24-26 mg twice daily, Toprol-XL 25 mg twice daily and Torsemide 20 mg daily. He had not been on anticoagulation due to a history of a GI bleed but by review of his chart, he had not experienced bleeding issues since 2020 and this was reviewed with his PCP who was in agreement with trying Eliquis 5 mg twice daily and obtaining follow-up labs.  He did follow-up with his PCP on 02/23/2023 and reported episodes of dizziness with one syncopal episode. Symptoms were felt to possibly be due to orthostasis and Dr. Gerda Diss did send a message and it was recommended to consider dose reduction of Torsemide. Creatinine was elevated to 1.51 at that time which suggested dehydration as well (previously 1.3 in 2023).  In talking with the patient today, he reports having a syncopal episode as discussed at the time of his office visit with his PCP. Reports this occurred after getting up from his bed to go change the thermostat and he lost consciousness for a few seconds which led to a fall. No recurrence since. He was unaware that he was supposed to reduce his dose of Torsemide and his still been taking 20 mg twice daily. He denies any specific dyspnea on exertion, orthopnea or PND. No recent chest pain or palpitations.  e does report still having episodic dizziness with changing positions.  Studies Reviewed:   EKG: EKG is not ordered  today.  Echocardiogram: 01/2019 IMPRESSIONS     1. Left ventricular ejection fraction, by visual estimation, is 50 to  55%. The left ventricle has normal function. There is mildly increased  left ventricular hypertrophy.   2. Left ventricular diastolic Doppler parameters are indeterminate  pattern of LV diastolic filling.   3. Global right ventricle has normal systolic function.The right  ventricular size is normal. Moderately increased right ventricular wall  thickness.   4. Left atrial size was normal.   5. Right atrial size was moderately dilated.   6. The mitral valve is normal in structure. No evidence of mitral valve  regurgitation. No evidence of mitral stenosis.   7. The tricuspid valve is normal in structure. Tricuspid valve  regurgitation is trivial.   8. The aortic valve has an indeterminant number of cusps Aortic valve  regurgitation was not visualized by color flow Doppler. Mild to moderate  aortic valve sclerosis/calcification without any evidence of aortic  stenosis.   9. The pulmonic valve was not well visualized. Pulmonic valve  regurgitation is not visualized by color flow Doppler.  10. Mildly elevated pulmonary artery systolic pressure.  11. The inferior vena cava is normal in size with greater than 50%  respiratory variability, suggesting right atrial pressure of 3 mmHg.  12. The interatrial septum was not well visualized.    Risk Assessment/Calculations:    CHA2DS2-VASc Score = 5   This indicates a 7.2% annual risk of  stroke. The patient's score is based upon: CHF History: 1 HTN History: 1 Diabetes History: 1 Stroke History: 0 Vascular Disease History: 0 Age Score: 2 Gender Score: 0     Physical Exam:   VS:  BP (!) 106/58   Pulse 80   Ht 6\' 1"  (1.854 m)   Wt 193 lb (87.5 kg)   SpO2 99%   BMI 25.46 kg/m    Wt Readings from Last 3 Encounters:  03/02/23 193 lb (87.5 kg)  02/23/23 195 lb 3.2 oz (88.5 kg)  01/01/23 190 lb (86.2 kg)      GEN: Pleasant elderly male appearing in no acute distress NECK: No JVD; No carotid bruits CARDIAC: Irregularly irregular, no murmurs, rubs, gallops RESPIRATORY:  Clear to auscultation without rales, wheezing or rhonchi  ABDOMEN: Appears non-distended. No obvious abdominal masses. EXTREMITIES: No clubbing or cyanosis. No pitting edema.  Distal pedal pulses are 2+ bilaterally.   Assessment and Plan:   1. Chronic HFimpEF - His ejection fraction had improved to 50 to 55% by most recent imaging in 01/2019.  He appears euvolemic by examination today and denies any recent respiratory issues.   - He is currently taking Entresto 24-26 mg twice daily, Toprol-XL 25 mg twice daily and Torsemide 20 mg twice daily. I did recommend reducing Torsemide to 20 mg once daily as previously recommended in discussion with his PCP. We discussed obtaining a follow-up echocardiogram but he wishes to hold off on this for now.  2. Permanent Atrial Fibrillation/Use of Long-Term Anticoagulation - Most recent device interrogation showed permanent atrial fibrillation and ventricular rates were somewhat blunted. He has remained on Toprol-XL 25 mg twice daily. - He is on Eliquis 5 mg twice daily for anticoagulation. Baseline creatinine is 1.2 - 1.3, but was elevated at 1.51 when checked on 02/25/2023 but he did not reduce Torsemide at that time. Will recheck a BMET in 4-6 weeks. If renal function remains above baseline, would need to reduce dosing of Eliquis to 2.5 mg twice daily given his age.  3. CHB/LBBB - He does have a [employer] CRT-P in place which is followed by Dr. Ladona Ridgel. Most recent device interrogation earlier this month showed normal device function. He is overdue for follow-up with Dr. Ladona Ridgel and we will ask the front office to arrange this.  4. HTN - BP is at 106/58 during today's visit. He is currently taking Entresto 24-26 mg twice daily and Toprol-XL 25 mg daily. Will reduce Torsemide as discussed  above.  5. Syncope - Previously felt to be due to orthostasis and he did not reduce Torsemide as discussed at the time of his office visit with his PCP. He has been taking 20 mg twice daily and will reduce to 20 mg once daily and check follow-up labs within the next month.  - We discussed obtaining a follow-up echocardiogram but he wishes to hold off for now. If he has recurrent syncope, would readdress.   Signed, Ellsworth Lennox, PA-C

## 2023-03-02 NOTE — Patient Instructions (Signed)
Medication Instructions:   DECREASE Torsemide to 20 mg daily    Labwork: BMET in 1-2 months at [facility]  Testing/Procedures: None today  Follow-Up:  6 months Dr.McDowell  Dr.Taylor next available for pacemaker check  Any Other Special Instructions Will Be Listed Below (If Applicable).  If you need a refill on your cardiac medications before your next appointment, please call your pharmacy.

## 2023-03-11 NOTE — Progress Notes (Signed)
Remote pacemaker transmission.   

## 2023-03-30 ENCOUNTER — Other Ambulatory Visit: Payer: Self-pay | Admitting: Cardiology

## 2023-04-12 ENCOUNTER — Other Ambulatory Visit: Payer: Self-pay | Admitting: Family Medicine

## 2023-04-14 DIAGNOSIS — I5032 Chronic diastolic (congestive) heart failure: Secondary | ICD-10-CM | POA: Diagnosis not present

## 2023-04-14 DIAGNOSIS — Z79899 Other long term (current) drug therapy: Secondary | ICD-10-CM | POA: Diagnosis not present

## 2023-04-14 DIAGNOSIS — I4821 Permanent atrial fibrillation: Secondary | ICD-10-CM | POA: Diagnosis not present

## 2023-04-15 LAB — BASIC METABOLIC PANEL
BUN/Creatinine Ratio: 17 (ref 10–24)
BUN: 23 mg/dL (ref 10–36)
CO2: 22 mmol/L (ref 20–29)
Calcium: 8.8 mg/dL (ref 8.6–10.2)
Chloride: 106 mmol/L (ref 96–106)
Creatinine, Ser: 1.37 mg/dL — ABNORMAL HIGH (ref 0.76–1.27)
Glucose: 102 mg/dL — ABNORMAL HIGH (ref 70–99)
Potassium: 4 mmol/L (ref 3.5–5.2)
Sodium: 145 mmol/L — ABNORMAL HIGH (ref 134–144)
eGFR: 49 mL/min/{1.73_m2} — ABNORMAL LOW (ref >59)

## 2023-05-02 ENCOUNTER — Other Ambulatory Visit: Payer: Self-pay | Admitting: Internal Medicine

## 2023-05-05 ENCOUNTER — Ambulatory Visit: Payer: Medicare Other | Attending: Internal Medicine | Admitting: Internal Medicine

## 2023-05-05 ENCOUNTER — Encounter: Payer: Self-pay | Admitting: Internal Medicine

## 2023-05-05 VITALS — BP 118/72 | HR 68 | Ht 74.0 in | Wt 190.8 lb

## 2023-05-05 DIAGNOSIS — I5022 Chronic systolic (congestive) heart failure: Secondary | ICD-10-CM | POA: Insufficient documentation

## 2023-05-05 LAB — CUP PACEART INCLINIC DEVICE CHECK
Date Time Interrogation Session: 20250129205057
Implantable Lead Connection Status: 753985
Implantable Lead Connection Status: 753985
Implantable Lead Connection Status: 753985
Implantable Lead Implant Date: 20170914
Implantable Lead Implant Date: 20170914
Implantable Lead Implant Date: 20220811
Implantable Lead Location: 753858
Implantable Lead Location: 753859
Implantable Lead Location: 753860
Implantable Lead Model: 3830
Implantable Lead Model: 5076
Implantable Lead Model: 5076
Implantable Pulse Generator Implant Date: 20220811

## 2023-05-05 NOTE — Patient Instructions (Signed)

## 2023-05-05 NOTE — Progress Notes (Signed)
HPI [name] returns today for followup. He has a h/o chronic systolic heart failure, s/p biv PPM insertion. He underwent insertion of a left bundle area lead when we last changed him out as his LV CS lead had a very high output.  He has chronic atrial fib with CHB. He denies chest pain or sob. No syncope.  Allergies  Allergen Reactions   Bee Venom Anaphylaxis    Honey bee      Current Outpatient Medications  Medication Sig Dispense Refill   allopurinol (ZYLOPRIM) 100 MG tablet TAKE 2 TABLETS BY MOUTH DAILY 60 tablet 6   apixaban (ELIQUIS) 5 MG TABS tablet Take 1 tablet (5 mg total) by mouth 2 (two) times daily. 60 tablet 11   blood glucose meter kit and supplies 1 each by Other route 4 (four) times daily -  before meals and at bedtime. 1 each 5   clobetasol cream (TEMOVATE) 0.05 % Apply 1 application topically 2 (two) times daily as needed (burns on hands).     docusate sodium (COLACE) 100 MG capsule Take 100 mg by mouth daily.     finasteride (PROSCAR) 5 MG tablet TAKE ONE TABLET BY MOUTH ONCE DAILY 90 tablet 3   metFORMIN (GLUCOPHAGE) 500 MG tablet TAKE ONE TABLET BY MOUTH DAILY. 90 tablet 0   metoprolol succinate (TOPROL-XL) 25 MG 24 hr tablet TAKE 1 TABLET BY MOUTH TWICE DAILY. 180 tablet 3   nitrofurantoin (MACRODANTIN) 50 MG capsule TAKE 1 CAPSULE BY MOUTH ONCE AT BEDTIME. 30 capsule 11   ondansetron (ZOFRAN) 8 MG tablet Take 1 tablet (8 mg total) by mouth every 8 (eight) hours as needed for nausea. 15 tablet 0   potassium chloride (KLOR-CON) 10 MEQ tablet Take 10 mEq by mouth daily.     potassium chloride (KLOR-CON) 10 MEQ tablet TAKE (1) TABLET BY MOUTH ONCE DAILY. 90 tablet 3   sacubitril-valsartan (ENTRESTO) 24-26 MG Take 1 tablet by mouth 2 (two) times daily. 60 tablet 6   silodosin (RAPAFLO) 8 MG CAPS capsule TAKE (1) CAPSULE BY MOUTH IN THE MORNING AND AT BEDTIME 60 capsule 11   simvastatin (ZOCOR) 40 MG tablet Take 1 tablet (40 mg total) by mouth daily. 90 tablet 3    torsemide (DEMADEX) 20 MG tablet Take 1 tablet (20 mg total) by mouth daily. 90 tablet 3   No current facility-administered medications for this visit.     Past Medical History:  Diagnosis Date   Arthritis    "right knee" (12/19/2015)   Chronic combined systolic (congestive) and diastolic (congestive) heart failure (HCC)    a. EF 20-25% in 2017 b. EF at 50-55% in 2020   Complete heart block (HCC)    S/P BiVPPM   History of colon polyps    History of gout    Hyperlipidemia    Hypertension    Myocardial infarction [facility])    "they said I'd had a heart attack but didn't know it" (12/19/2015).  No CAD on cath   Seasonal allergies    Skin cancer    "arms/hands" (12/19/2015)   Sleep apnea    supposed to wear CPAP, but doesn't (12/19/2015)   Type II diabetes mellitus (HCC)     ROS:   All systems reviewed and negative except as noted in the HPI.   Past Surgical History:  Procedure Laterality Date   BIV PACEMAKER GENERATOR CHANGEOUT N/A 11/14/2020   Procedure: BIV PACEMAKER GENERATOR CHANGEOUT;  Surgeon: Marinus Maw, MD;  Location: [facility] INVAS[facility];  Service: Cardiovascular;  Laterality: N/A;   CATARACT EXTRACTION W/PHACO Right 09/08/2012   Procedure: CATARACT EXTRACTION PHACO AND INTRAOCULAR LENS PLACEMENT (IOC);  Surgeon: Gemma Payor, MD;  Location: [facility];  Service: Ophthalmology;  Laterality: Right;  CDE: 21.28   CATARACT EXTRACTION W/PHACO Left 12/08/2012   Procedure: CATARACT EXTRACTION PHACO AND INTRAOCULAR LENS PLACEMENT (IOC);  Surgeon: Gemma Payor, MD;  Location: [facility];  Service: Ophthalmology;  Laterality: Left;  CDE:25.72   CHOLECYSTECTOMY OPEN  1968   COLONOSCOPY     4 procedures   COLONOSCOPY  2012   Dr. Jarold Motto: diverticulosis, lipoma in ascending colon.    CYSTOSCOPY WITH FULGERATION N/A 08/11/2017   Procedure: CYSTOSCOPY WITH FULGERATION OF PROSTATIC VARICES;  Surgeon: Malen Gauze, MD;  Location: [facility];  Service: Urology;  Laterality: N/A;   EP  IMPLANTABLE DEVICE N/A 12/19/2015   Procedure: BiV Pacemaker Insertion CRT-P;  Surgeon: Marinus Maw, MD;  Location: [facility];  Service: Cardiovascular;  Laterality: N/A;   INSERT / REPLACE / REMOVE PACEMAKER  12/19/2015   JOINT REPLACEMENT Left    knee   LEAD REVISION/REPAIR N/A 11/14/2020   Procedure: LEAD REVISION/REPAIR;  Surgeon: Marinus Maw, MD;  Location: [facility] CV [facility];  Service: Cardiovascular;  Laterality: N/A;   POLYPECTOMY     TONSILLECTOMY     TOTAL KNEE ARTHROPLASTY Left 2000s   TRANSURETHRAL RESECTION OF PROSTATE     YAG LASER APPLICATION Right 06/16/2016   Procedure: YAG LASER APPLICATION;  Surgeon: Jethro Bolus, MD;  Location: [facility];  Service: Ophthalmology;  Laterality: Right;   YAG LASER APPLICATION Left 06/30/2016   Procedure: YAG LASER APPLICATION;  Surgeon: Jethro Bolus, MD;  Location: [facility];  Service: Ophthalmology;  Laterality: Left;     Family History  Problem Relation Age of Onset   Lung cancer Father    Colon cancer Neg Hx      Social History   Socioeconomic History   Marital status: Widowed    Spouse name: Not on file   Number of children: Not on file   Years of education: Not on file   Highest education level: Not on file  Occupational History   Not on file  Tobacco Use   Smoking status: Never   Smokeless tobacco: Never  Vaping Use   Vaping status: Never Used  Substance and Sexual Activity   Alcohol use: Not Currently    Alcohol/week: 2.0 standard drinks of alcohol    Types: 2 Glasses of wine per week   Drug use: No   Sexual activity: Not Currently    Birth control/protection: None  Other Topics Concern   Not on file  Social History Narrative   Not on file   Social Drivers of Health   Financial Resource Strain: Low Risk  (01/01/2023)   Overall Financial Resource Strain (CARDIA)    Difficulty of Paying Living Expenses: Not hard at all  Food Insecurity: No Food Insecurity (01/01/2023)   Hunger Vital Sign    Worried  About Running Out of Food in the Last Year: Never true    Ran Out of Food in the Last Year: Never true  Transportation Needs: No Transportation Needs (01/01/2023)   PRAPARE - Administrator, Civil Service (Medical): No    Lack of Transportation (Non-Medical): No  Physical Activity: Inactive (01/01/2023)   Exercise Vital Sign    Days of Exercise per Week: 0 days    Minutes  of Exercise per Session: 0 min  Stress: No Stress Concern Present (01/01/2023)   [employer] of Occupational Health - Occupational Stress Questionnaire    Feeling of Stress : Not at all  Social Connections: Moderately Isolated (01/01/2023)   Social Connection and Isolation Panel [NHANES]    Frequency of Communication with Friends and Family: More than three times a week    Frequency of Social Gatherings with Friends and Family: More than three times a week    Attends Religious Services: More than 4 times per year    Active Member of [employer] or Organizations: No    Attends Banker Meetings: Never    Marital Status: Widowed  Intimate Partner Violence: Not At Risk (01/01/2023)   Humiliation, Afraid, Rape, and Kick questionnaire    Fear of Current or Ex-Partner: No    Emotionally Abused: No    Physically Abused: No    Sexually Abused: No     BP 118/72   Pulse 68   Ht 6\' 2"  (1.88 m)   Wt 190 lb 12.8 oz (86.5 kg)   SpO2 99%   BMI 24.50 kg/m   Physical Exam:  Well appearing NAD HEENT: Unremarkable Neck:  No JVD, no thyromegally Lymphatics:  No adenopathy Back:  No CVA tenderness Lungs:  Clear HEART:  Regular rate rhythm, no murmurs, no rubs, no clicks Abd:  soft, positive bowel sounds, no organomegally, no rebound, no guarding Ext:  2 plus pulses, no edema, no cyanosis, no clubbing Skin:  No rashes no nodules Neuro:  CN II through XII intact, motor grossly intact  EKG  DEVICE  Normal device function.  See PaceArt for details.   Assess/Plan:  1. Chronic systolic heart failure  - his symptoms are class 2 on medical therapy. He will follow. His fluid index looks good. 2. BIV PPM - His Medtronic biv device is working normally with a new left bundle lead in place.  3. CHB - he has no escape today, s/p PPM. 4. Atrial fib - his VR is well controlled. He will continue his current meds. He is not a candidate for systemic anti-coag due to bleeding.    Sharlot Gowda Persis Graffius,MD

## 2023-05-15 ENCOUNTER — Other Ambulatory Visit: Payer: Self-pay | Admitting: Family Medicine

## 2023-05-17 ENCOUNTER — Other Ambulatory Visit: Payer: Self-pay

## 2023-05-17 MED ORDER — METFORMIN HCL 500 MG PO TABS: ORAL_TABLET | ORAL | 0 refills | Status: DC

## 2023-05-18 ENCOUNTER — Ambulatory Visit (INDEPENDENT_AMBULATORY_CARE_PROVIDER_SITE_OTHER): Payer: Medicare Other

## 2023-05-18 DIAGNOSIS — I5022 Chronic systolic (congestive) heart failure: Secondary | ICD-10-CM | POA: Diagnosis not present

## 2023-05-19 LAB — CUP PACEART REMOTE DEVICE CHECK
Battery Remaining Longevity: 111 mo
Battery Voltage: 3.01 V
Brady Statistic RA Percent Paced: 0 %
Brady Statistic RV Percent Paced: 99.51 %
Date Time Interrogation Session: 20250211083612
Implantable Lead Connection Status: 753985
Implantable Lead Connection Status: 753985
Implantable Lead Connection Status: 753985
Implantable Lead Implant Date: 20170914
Implantable Lead Implant Date: 20170914
Implantable Lead Implant Date: 20220811
Implantable Lead Location: 753858
Implantable Lead Location: 753859
Implantable Lead Location: 753860
Implantable Lead Model: 3830
Implantable Lead Model: 5076
Implantable Lead Model: 5076
Implantable Pulse Generator Implant Date: 20220811
Lead Channel Impedance Value: 247 Ohm
Lead Channel Impedance Value: 285 Ohm
Lead Channel Impedance Value: 342 Ohm
Lead Channel Impedance Value: 361 Ohm
Lead Channel Impedance Value: 399 Ohm
Lead Channel Impedance Value: 456 Ohm
Lead Channel Impedance Value: 475 Ohm
Lead Channel Impedance Value: 494 Ohm
Lead Channel Impedance Value: 494 Ohm
Lead Channel Pacing Threshold Amplitude: 0.5 V
Lead Channel Pacing Threshold Amplitude: 0.625 V
Lead Channel Pacing Threshold Pulse Width: 0.4 ms
Lead Channel Pacing Threshold Pulse Width: 0.5 ms
Lead Channel Sensing Intrinsic Amplitude: 0.5 mV
Lead Channel Sensing Intrinsic Amplitude: 0.5 mV
Lead Channel Sensing Intrinsic Amplitude: 8.375 mV
Lead Channel Sensing Intrinsic Amplitude: 8.375 mV
Lead Channel Setting Pacing Amplitude: 1 V
Lead Channel Setting Pacing Amplitude: 2 V
Lead Channel Setting Pacing Pulse Width: 0.4 ms
Lead Channel Setting Pacing Pulse Width: 0.5 ms
Lead Channel Setting Sensing Sensitivity: 2.8 mV
Zone Setting Status: 755011
Zone Setting Status: 755011

## 2023-05-21 ENCOUNTER — Other Ambulatory Visit: Payer: Self-pay | Admitting: Family Medicine

## 2023-05-23 ENCOUNTER — Encounter: Payer: Self-pay | Admitting: Internal Medicine

## 2023-05-28 ENCOUNTER — Ambulatory Visit: Payer: Medicare Other | Admitting: Family Medicine

## 2023-06-01 ENCOUNTER — Ambulatory Visit (INDEPENDENT_AMBULATORY_CARE_PROVIDER_SITE_OTHER): Payer: Medicare Other | Admitting: Family Medicine

## 2023-06-01 ENCOUNTER — Encounter: Payer: Self-pay | Admitting: Family Medicine

## 2023-06-01 VITALS — BP 124/74 | HR 84 | Temp 98.1°F | Ht 74.0 in | Wt 193.0 lb

## 2023-06-01 DIAGNOSIS — I10 Essential (primary) hypertension: Secondary | ICD-10-CM | POA: Diagnosis not present

## 2023-06-01 DIAGNOSIS — I83892 Varicose veins of left lower extremities with other complications: Secondary | ICD-10-CM | POA: Diagnosis not present

## 2023-06-01 DIAGNOSIS — E1169 Type 2 diabetes mellitus with other specified complication: Secondary | ICD-10-CM

## 2023-06-01 DIAGNOSIS — Z7984 Long term (current) use of oral hypoglycemic drugs: Secondary | ICD-10-CM

## 2023-06-01 NOTE — Progress Notes (Addendum)
 Subjective:    Patient ID: Anthony Lucas, male    DOB: 1930-04-17, 89 y.o.   MRN: [id number]  Discussed the use of AI scribe software for clinical note transcription with the patient, who gave verbal consent to proceed.  History of Present Illness   Anthony Lucas is a 89 year old male who presents with concerns about varicose veins and knee pain. He is accompanied by Anthony Lucas, who stays with him.  He has varicose veins near the top of his leg, which are soft. He monitors them at night and is concerned about his appearance.  He experiences knee pain, particularly in the morning after sleeping. He manages the pain with Tylenol, taking two 500 mg tablets in the morning and one at night. He is careful not to exceed six tablets per day.  He has difficulty controlling urination but does not provide further details.  His diabetes was last checked in November and was under good control. He recently saw a cardiologist who noted improvements in his heart condition.  He lives with family support, specifically mentioning Anthony Lucas who stays with him. His home setup includes a microwave, refrigerator, and a large TV, which he uses frequently.     Patient states he is tolerating the metformin well Relates he is tolerating simvastatin well Taking his blood pressure medicines well Lives with his daughter who is doing a good job with healthy choices for him    Review of Systems     Objective:    Physical Exam     General-in no acute distress Eyes-no discharge Lungs-respiratory rate normal, CTA CV-no murmurs,RRR Extremities skin warm dry varicose veins noted on the legs Neuro grossly normal Behavior normal, alert           Assessment & Plan:  Assessment and Plan    Varicose Veins Noted new varicose veins on the legs. No signs of thrombophlebitis. -Advised to monitor for changes and report if veins become firm or tender.  Knee Pain Reports knee pain, particularly in the morning.  Currently taking Tylenol 500mg  for pain management. -Continue Tylenol 500mg , up to 6 tablets per day as needed for pain.  Diabetes Last blood work in November showed good control of diabetes. -No changes to current management plan.  Urinary Incontinence Reports ongoing issue with urinary incontinence. -No changes discussed in management plan.  Cardiovascular Health Recent visit with cardiologist showed improvement in heart health. -No changes to current management plan.  Follow-up in 5 months.     Do lab work before next visit continue medication healthy diet 1. 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  2. Controlled type 2 diabetes mellitus with other specified complication, without long-term current use of insulin (HCC) Previous lab work looked good  3. Essential hypertension Blood pressure good continue current medication healthy diet lab work before next visit in approximately 5 to 6 months  Patient also has osteoarthritis of his knees with previous knee replacement in addition this also has osteoarthritis in multiple joints including his back.  Because of this he has very difficult time walking.  He walks very limited distances often uses a cane or a walker when necessary.  Patient has had frequent falls because of this.  In addition to this patient does have neuropathy in his feet which cause him difficulty sensing position when he is walking.  He is very limited in what he can do for himself at home.  He does not do the cleaning the laundry or feeding fixing.  He is able to eat it with foods like probable walking a year.  Daughter and his granddaughter help take care of him.  He also gets limited help from hired aide.  Patient also has advanced CHF is on multiple medications currently stable.  He is debilitated.  He would benefit from ongoing help and assistance.  He would not be able to live on his own  without that assistance.

## 2023-06-02 ENCOUNTER — Other Ambulatory Visit: Payer: Self-pay

## 2023-06-02 DIAGNOSIS — I10 Essential (primary) hypertension: Secondary | ICD-10-CM

## 2023-06-02 DIAGNOSIS — E1169 Type 2 diabetes mellitus with other specified complication: Secondary | ICD-10-CM

## 2023-06-02 DIAGNOSIS — Z79899 Other long term (current) drug therapy: Secondary | ICD-10-CM

## 2023-06-06 ENCOUNTER — Telehealth: Payer: Self-pay | Admitting: Family Medicine

## 2023-06-06 NOTE — Telephone Encounter (Signed)
 Patient sent in forms for long-term care insurance policy

## 2023-06-07 ENCOUNTER — Encounter: Payer: Self-pay | Admitting: Family Medicine

## 2023-06-07 ENCOUNTER — Telehealth: Payer: Self-pay | Admitting: Family Medicine

## 2023-06-07 NOTE — Progress Notes (Signed)
.    Please print letter to go with the policy that I have finished then please give to his daughter thank you

## 2023-06-07 NOTE — Telephone Encounter (Signed)
 I filled out the policy form I dictated a letter Please get both to his daughter Anthony Lucas thank you  She stated she would pick it up when it was ready please call her thank you

## 2023-06-07 NOTE — Telephone Encounter (Signed)
 Forms completed with letter, message sent to the front

## 2023-06-07 NOTE — Telephone Encounter (Signed)
 I did talk with his daughter regarding his health. They are giving him some assistance at home but hoping to qualify for greater assistance through his long-term care policy This policy was filled out This patient would not be able to live on his own without the help and assistance of others with caretaking including laundry, food prep, cleaning, assisting with other issues,  It should be noted we also had discussion regarding his driving.  I recommended local driving only, no high-speed highways, daytime also encouraged family to ride with him to make sure that his driving is safe.

## 2023-06-10 ENCOUNTER — Other Ambulatory Visit: Payer: Self-pay | Admitting: Cardiology

## 2023-06-24 NOTE — Addendum Note (Signed)
 Addended by: Elease Etienne A on: 06/24/2023 04:10 PM   Modules accepted: Orders

## 2023-06-24 NOTE — Progress Notes (Signed)
 Remote pacemaker transmission.

## 2023-07-01 DIAGNOSIS — E1142 Type 2 diabetes mellitus with diabetic polyneuropathy: Secondary | ICD-10-CM | POA: Diagnosis not present

## 2023-07-01 DIAGNOSIS — B351 Tinea unguium: Secondary | ICD-10-CM | POA: Diagnosis not present

## 2023-08-17 ENCOUNTER — Ambulatory Visit (INDEPENDENT_AMBULATORY_CARE_PROVIDER_SITE_OTHER): Payer: Medicare Other

## 2023-08-17 DIAGNOSIS — I428 Other cardiomyopathies: Secondary | ICD-10-CM

## 2023-08-18 LAB — CUP PACEART REMOTE DEVICE CHECK
Battery Remaining Longevity: 108 mo
Battery Voltage: 3 V
Brady Statistic RA Percent Paced: 0 %
Brady Statistic RV Percent Paced: 98.13 %
Date Time Interrogation Session: 20250513010258
Implantable Lead Connection Status: 753985
Implantable Lead Connection Status: 753985
Implantable Lead Connection Status: 753985
Implantable Lead Implant Date: 20170914
Implantable Lead Implant Date: 20170914
Implantable Lead Implant Date: 20220811
Implantable Lead Location: 753858
Implantable Lead Location: 753859
Implantable Lead Location: 753860
Implantable Lead Model: 3830
Implantable Lead Model: 5076
Implantable Lead Model: 5076
Implantable Pulse Generator Implant Date: 20220811
Lead Channel Impedance Value: 247 Ohm
Lead Channel Impedance Value: 285 Ohm
Lead Channel Impedance Value: 342 Ohm
Lead Channel Impedance Value: 361 Ohm
Lead Channel Impedance Value: 399 Ohm
Lead Channel Impedance Value: 475 Ohm
Lead Channel Impedance Value: 475 Ohm
Lead Channel Impedance Value: 494 Ohm
Lead Channel Impedance Value: 513 Ohm
Lead Channel Pacing Threshold Amplitude: 0.5 V
Lead Channel Pacing Threshold Amplitude: 0.625 V
Lead Channel Pacing Threshold Pulse Width: 0.4 ms
Lead Channel Pacing Threshold Pulse Width: 0.5 ms
Lead Channel Sensing Intrinsic Amplitude: 0.75 mV
Lead Channel Sensing Intrinsic Amplitude: 0.75 mV
Lead Channel Sensing Intrinsic Amplitude: 7.25 mV
Lead Channel Sensing Intrinsic Amplitude: 7.25 mV
Lead Channel Setting Pacing Amplitude: 1 V
Lead Channel Setting Pacing Amplitude: 2 V
Lead Channel Setting Pacing Pulse Width: 0.4 ms
Lead Channel Setting Pacing Pulse Width: 0.5 ms
Lead Channel Setting Sensing Sensitivity: 2.8 mV
Zone Setting Status: 755011
Zone Setting Status: 755011

## 2023-08-19 ENCOUNTER — Other Ambulatory Visit: Payer: Self-pay | Admitting: Family Medicine

## 2023-08-23 ENCOUNTER — Ambulatory Visit: Payer: Self-pay | Admitting: Internal Medicine

## 2023-09-08 ENCOUNTER — Other Ambulatory Visit: Payer: Self-pay | Admitting: Student

## 2023-09-08 DIAGNOSIS — I4891 Unspecified atrial fibrillation: Secondary | ICD-10-CM

## 2023-09-08 NOTE — Telephone Encounter (Signed)
 Eliquis  5mg  refill request received. Patient is 89 years old, weight-87.5kg, Crea-1.37 on 04/14/23, Diagnosis-Afib, and last seen by Dr. Carolynne Citron on 05/05/23. Dose is appropriate based on dosing criteria. Will send in refill to requested pharmacy.

## 2023-09-09 DIAGNOSIS — B351 Tinea unguium: Secondary | ICD-10-CM | POA: Diagnosis not present

## 2023-09-09 DIAGNOSIS — E1142 Type 2 diabetes mellitus with diabetic polyneuropathy: Secondary | ICD-10-CM | POA: Diagnosis not present

## 2023-09-10 ENCOUNTER — Ambulatory Visit: Payer: Medicare Other | Attending: Cardiology | Admitting: Cardiology

## 2023-09-10 ENCOUNTER — Encounter: Payer: Self-pay | Admitting: Cardiology

## 2023-09-10 VITALS — BP 132/72 | HR 81 | Ht 73.0 in | Wt 190.0 lb

## 2023-09-10 DIAGNOSIS — I5032 Chronic diastolic (congestive) heart failure: Secondary | ICD-10-CM | POA: Insufficient documentation

## 2023-09-10 DIAGNOSIS — I4821 Permanent atrial fibrillation: Secondary | ICD-10-CM | POA: Insufficient documentation

## 2023-09-10 DIAGNOSIS — I428 Other cardiomyopathies: Secondary | ICD-10-CM | POA: Diagnosis not present

## 2023-09-10 DIAGNOSIS — E782 Mixed hyperlipidemia: Secondary | ICD-10-CM | POA: Diagnosis not present

## 2023-09-10 NOTE — Progress Notes (Signed)
 Cardiology Office Note  Date: 09/10/2023   ID: TRISTIAN SICKINGER, DOB 24-Aug-1930, MRN [id number]  History of Present Illness: Anthony Lucas is a 89 y.o. male last seen in November 2024 by Steva Ek, I reviewed her note.  This is actually our first meeting in the office.  I reviewed his records.  He was followed in the past by Dr. Wanetta Guthrie.  He is here for a routine visit.  He describes NYHA class II dyspnea with typical activities around his house.  He lives alone but does have family nearby.  He does not report any exertional chest pain and has no sense of palpitations.  No dizziness or syncope.  He reports no problems with fluid retention or unusual weight gain.  Medtronic CRT-P in place with follow-up by Dr. Carolynne Citron.  Device interrogation in May revealed normal function with underlying atrial fibrillation.  We went over his medications.  He does not describe any spontaneous bleeding problems on Eliquis .  Reports compliance with therapy.  Currently taking Demadex  20 mg daily with potassium supplement and has had no fluid retention.  His last echocardiogram was in 2020.  Physical Exam: VS:  BP 132/72   Pulse 81   Ht 6\' 1"  (1.854 m)   Wt 190 lb (86.2 kg)   SpO2 96%   BMI 25.07 kg/m , BMI Body mass index is 25.07 kg/m.  Wt Readings from Last 3 Encounters:  09/10/23 190 lb (86.2 kg)  06/01/23 193 lb (87.5 kg)  05/05/23 190 lb 12.8 oz (86.5 kg)    General: Patient appears comfortable at rest. HEENT: Conjunctiva and lids normal. Neck: Supple, no elevated JVP or carotid bruits. Lungs: Clear to auscultation, nonlabored breathing at rest. Cardiac: Regular rate and rhythm, no S3 or significant systolic murmur. Extremities: No pitting edema.  ECG:  An ECG dated 02/23/2023 was personally reviewed today and demonstrated:  Ventricular pacing.  Labwork: 02/25/2023: ALT 20; AST 16; Hemoglobin 12.5; Platelets 161 04/14/2023: BUN 23; Creatinine, Ser 1.37; Potassium 4.0; Sodium 145      Component Value Date/Time   CHOL 126 02/25/2023 0906   TRIG 103 02/25/2023 0906   HDL 47 02/25/2023 0906   CHOLHDL 2.7 02/25/2023 0906   CHOLHDL 3.4 01/23/2014 0841   VLDL 24 01/23/2014 0841   LDLCALC 60 02/25/2023 0906   Other Studies Reviewed Today:  No interval cardiac testing for review today.  Assessment and Plan:  1.  History of nonischemic cardiomyopathy (no significant CAD at cardiac catheterization in 2010) with improvement in LVEF over time.  Last echocardiogram was in 2020 at which point LVEF was 50 to 55%.  He was stable with NYHA class II dyspnea and no fluid retention on current regimen.  Continue Entresto  24/26 mg twice daily, Toprol -XL 25 mg twice daily, and Demadex  20 mg daily with potassium supplement.  We did discuss follow-up echocardiogram given duration since last evaluation.  Given present clinical stability, he was comfortable with observation and continuing the current plan.  If his symptoms worsen, can reconsider.  2.  Permanent atrial fibrillation with CHA2DS2-VASc score of 5.  Asymptomatic in terms of palpitations.  He is on Toprol -XL 25 mg twice daily and Eliquis  5 mg twice daily.  3.  Medtronic CRT-P in place with followed by Dr. Carolynne Citron.  Device interrogation in May revealed normal function.  4.  Primary hypertension.  No changes made to present regimen.  5.  Mixed hyperlipidemia, on Zocor  40 mg daily.  LDL 60 in November 2024.  Disposition:  Follow up 6 months.  Signed, Gerard Knight, M.D., F.A.C.C. [facility]

## 2023-09-10 NOTE — Patient Instructions (Addendum)

## 2023-09-20 ENCOUNTER — Ambulatory Visit: Payer: Self-pay

## 2023-09-20 NOTE — Telephone Encounter (Signed)
 Appt scehduled

## 2023-09-20 NOTE — Telephone Encounter (Signed)
 FYI Only or Action Required?: FYI only for provider  Patient was last seen in primary care on 06/01/2023 by Bennet Brasil, MD. Called Nurse Triage reporting No chief complaint on file.. Symptoms began several days ago. Interventions attempted: Nothing. Symptoms are: gradually worsening.    Triage Disposition: No disposition on file.  Patient/caregiver understands and will follow disposition?:    Copied from CRM [id number]. Topic: Clinical - Red Word Triage >> Sep 20, 2023 12:47 PM Marissa P wrote: Red Word that prompted transfer to Nurse Triage: Patient may have a uti and mental is off as well. Granddaughter is calling to see if he can get seen possibly today or tomorrow please Answer Assessment - Initial Assessment Questions 1. SYMPTOM: What's the main symptom you're concerned about? (e.g., frequency, incontinence)     Confusion, Fatigue  2. ONSET: When did the symptoms  start?     Over the weekend  3. PAIN: Is there any pain? If Yes, ask: How bad is it? (Scale: 1-10; mild, moderate, severe)      Unsure, the caller is not with the patient  4. CAUSE: What do you think is causing the symptoms?    UTI  5. OTHER SYMPTOMS: Do you have any other symptoms? (e.g., blood in urine, fever, flank pain, pain with urination)     Fatigue  Answer Assessment - Initial Assessment Questions 1. LEVEL OF CONSCIOUSNESS: How is he (she, the patient) acting right now? (e.g., alert-oriented, confused, lethargic, stuporous, comatose)     Forgetful  2. ONSET: When did the confusion start?  (minutes, hours, days)     Over the weekend  3. PATTERN Does this come and go, or has it been constant since it started?  Is it present now?     Constant since start  4. ALCOHOL or DRUGS: Has he been drinking alcohol or taking any drugs?      No  5. NARCOTIC MEDICINES: Has he been receiving any narcotic medications? (e.g., morphine, Vicodin)     No  6. CAUSE: What do you think is causing  the confusion?      Unsure  7. OTHER SYMPTOMS: Are there any other symptoms? (e.g., difficulty breathing, headache, fever, weakness)     Fatigue,  Protocols used: Urinary Symptoms-A-AH, Confusion - Delirium-A-AH

## 2023-09-21 ENCOUNTER — Ambulatory Visit: Admitting: Family Medicine

## 2023-09-21 ENCOUNTER — Ambulatory Visit: Payer: Self-pay | Admitting: Family Medicine

## 2023-09-21 VITALS — BP 114/62 | HR 83 | Temp 97.7°F | Ht 73.0 in | Wt 194.0 lb

## 2023-09-21 DIAGNOSIS — R300 Dysuria: Secondary | ICD-10-CM

## 2023-09-21 DIAGNOSIS — S0093XA Contusion of unspecified part of head, initial encounter: Secondary | ICD-10-CM | POA: Diagnosis not present

## 2023-09-21 DIAGNOSIS — E785 Hyperlipidemia, unspecified: Secondary | ICD-10-CM | POA: Diagnosis not present

## 2023-09-21 DIAGNOSIS — I10 Essential (primary) hypertension: Secondary | ICD-10-CM | POA: Diagnosis not present

## 2023-09-21 DIAGNOSIS — Z9181 History of falling: Secondary | ICD-10-CM | POA: Diagnosis not present

## 2023-09-21 DIAGNOSIS — D509 Iron deficiency anemia, unspecified: Secondary | ICD-10-CM

## 2023-09-21 DIAGNOSIS — E1169 Type 2 diabetes mellitus with other specified complication: Secondary | ICD-10-CM | POA: Diagnosis not present

## 2023-09-21 LAB — POCT URINALYSIS DIP (CLINITEK)
Bilirubin, UA: NEGATIVE
Glucose, UA: NEGATIVE mg/dL
Ketones, POC UA: NEGATIVE mg/dL
Nitrite, UA: POSITIVE — AB
POC PROTEIN,UA: NEGATIVE
Spec Grav, UA: 1.01 (ref 1.010–1.025)
Urobilinogen, UA: 0.2 U/dL
pH, UA: 6 (ref 5.0–8.0)

## 2023-09-21 MED ORDER — CEPHALEXIN 500 MG PO CAPS: ORAL_CAPSULE | ORAL | 0 refills | Status: DC

## 2023-09-21 NOTE — Progress Notes (Signed)
 Subjective:    Patient ID: Anthony Lucas, male    DOB: [birth year], 89 y.o.   MRN: [id number]  HPI Discussed the use of AI scribe software for clinical note transcription with the patient, who gave verbal consent to proceed.  History of Present Illness   Anthony Lucas is a 89 year old male who presents with low energy and recent falls.  He experiences low energy and a lack of motivation to engage in activities he previously enjoyed, such as yard work. He has not run a lawnmower in two years and feels held back by his lack of energy. He feels okay while sitting but lacks the energy to get up and do things. Odilia Bennett notes that his energy has been lower, and he appears tired and unable to do things he would like to do, such as yard work. He falls asleep more than usual during activities like watching races but does not sleep through them entirely.  He describes difficulty with walking due to a 'bad knee' and feeling unsteady. He started using a cane about three to four weeks ago but sometimes forgets it in various places. He feels wobbly upon standing and has experienced two falls where he passed out and hit his head on the carpet, possibly losing consciousness briefly. In the past, there were discussions about blood pressure issues and the need to move his legs before standing. He feels wobbly when first getting up and sometimes feels like he might fall.  He reports a decreased appetite, stating he does not get hungry as much and sometimes avoids eating if it requires effort to get up. However, he drinks plenty of liquids and does not skip main meals, though he may skip midday meals. He typically eats corn flakes for breakfast and often eats at [place] [employer].  His sleep is fragmented, with difficulty falling asleep and only getting one to two hours of sleep at a time. He typically gets up around 8 AM and sometimes falls asleep during the day but not for extended periods.  He reports a slow  urinary flow but denies any pain or odor. No fevers and describes his breathing as good. No confusion or memory issues, although he sometimes wonders which way to go while driving but has not had any accidents.     Patient did have a fall 4 to 6 weeks ago that he thinks he hit his head but he cannot remember He denies any fevers or chills denies abdominal pain vomiting diarrhea.  States appetite doing all right  Family member states there has been a couple times in the past week that he has been confused about what items are around his house they think it is related to the fact he has been feeling sick lately otherwise he has relatively decent memory and cognitive function  Review of Systems     Objective:   Physical Exam  General-in no acute distress Eyes-no discharge Lungs-respiratory rate normal, CTA CV-no murmurs,RRR Extremities skin warm dry no edema Neuro grossly normal Behavior normal, alert Patient has a very unsteady gait.  Walks with a cane.  His knees stay bent.  His feet clearly pick up.  He is at increased risk of falls  Patient is aware of where he is what month and year.  Patient draws clock fairly well instant recall 343 delayed recall 1 for 3  Physical Exam   VITALS: BP- 114/62 CHEST: Lungs clear to auscultation bilaterally. SKIN: No significant swelling.  Orthostatic Hypotension Orthostatic hypotension with dizziness and falls. Consultation with cardiologist planned for medication review. Advised leg movement before standing and sitting if dizzy. - Consult with Dr. Londa Rival for medication adjustments. - Advise leg movement before standing and sitting if dizzy.  Fall Risk Increased fall risk due to orthostatic hypotension, balance issues, and shuffling gait. Advised to eliminate trip hazards and use caution, especially at night. - Eliminate trip hazards at home. - Use lights at night to prevent falls.  Fatigue Persistent fatigue possibly related to  orthostatic hypotension or other conditions. Blood work planned to investigate. - Order blood work to investigate causes of fatigue.  Memory Concerns Memory issues with no evidence of dementia. CT scan planned to rule out small strokes. - Order CT scan of the head to rule out small strokes.  Driving Safety Advised to reduce driving and use family or delivery services for errands due to age-related risks. - Recommend reducing driving and using family or delivery services for errands.  Follow-up Advised to complete blood work and CT scan. Follow-up with cardiologist planned for orthostatic hypotension. - Complete blood work. - Schedule CT scan of the head. - Follow up with cardiologist.          Assessment & Plan:  1. Dysuria (Primary) Urine culture Keflex 5 days Warning signs discussed - POCT URINALYSIS DIP (CLINITEK) - Urine Culture  2. Controlled type 2 diabetes mellitus with other specified complication, without long-term current use of insulin (HCC) Check A1c await lab results continue current measures - Hemoglobin A1c  3. Essential hypertension Blood pressure does drop with standing will send notification to cardiology to see if they recommend standing with his current medicine or watching things closely or adjusting - Basic Metabolic Panel  4. Hyperlipidemia associated with type 2 diabetes mellitus (HCC) Bad cholesterol reasonable control continue current measures check labs - Lipid Panel  5. Iron deficiency anemia, unspecified iron deficiency anemia type Check lab work to make sure anemia is not part of this - CBC with Differential  6. Contusion of head, unspecified part of head, initial encounter Patient took a fall 4 to 6 weeks ago.  Given that he does have some cognitive dysfunction over the past few days and memory issues are starting to appear it would be reasonable to get a head CT cannot do MRI because of pacemaker - CT HEAD WO CONTRAST ( )  7. At risk  for falling We discussed strategies to minimize risk of falling at home  Follow-up 11/02/2023 sooner if any problems

## 2023-09-22 LAB — BASIC METABOLIC PANEL WITH GFR
BUN/Creatinine Ratio: 16 (ref 10–24)
BUN: 23 mg/dL (ref 10–36)
CO2: 21 mmol/L (ref 20–29)
Calcium: 9.4 mg/dL (ref 8.6–10.2)
Chloride: 103 mmol/L (ref 96–106)
Creatinine, Ser: 1.46 mg/dL — ABNORMAL HIGH (ref 0.76–1.27)
Glucose: 94 mg/dL (ref 70–99)
Potassium: 4.5 mmol/L (ref 3.5–5.2)
Sodium: 144 mmol/L (ref 134–144)
eGFR: 45 mL/min/{1.73_m2} — ABNORMAL LOW (ref >59)

## 2023-09-22 LAB — CBC WITH DIFFERENTIAL/PLATELET
Basophils Absolute: 0.1 10*3/uL (ref 0.0–0.2)
Basos: 1 %
EOS (ABSOLUTE): 0.7 10*3/uL — ABNORMAL HIGH (ref 0.0–0.4)
Eos: 8 %
Hematocrit: 39.4 % (ref 37.5–51.0)
Hemoglobin: 12.5 g/dL — ABNORMAL LOW (ref 13.0–17.7)
Immature Grans (Abs): 0 10*3/uL (ref 0.0–0.1)
Immature Granulocytes: 0 %
Lymphocytes Absolute: 1.1 10*3/uL (ref 0.7–3.1)
Lymphs: 12 %
MCH: 28.9 pg (ref 26.6–33.0)
MCHC: 31.7 g/dL (ref 31.5–35.7)
MCV: 91 fL (ref 79–97)
Monocytes Absolute: 0.7 10*3/uL (ref 0.1–0.9)
Monocytes: 8 %
Neutrophils Absolute: 6.5 10*3/uL (ref 1.4–7.0)
Neutrophils: 71 %
Platelets: 183 10*3/uL (ref 150–450)
RBC: 4.32 x10E6/uL (ref 4.14–5.80)
RDW: 13.5 % (ref 11.6–15.4)
WBC: 9.1 10*3/uL (ref 3.4–10.8)

## 2023-09-22 LAB — LIPID PANEL
Chol/HDL Ratio: 2.6 ratio (ref 0.0–5.0)
Cholesterol, Total: 124 mg/dL (ref 100–199)
HDL: 47 mg/dL (ref >39)
LDL Chol Calc (NIH): 52 mg/dL (ref 0–99)
Triglycerides: 149 mg/dL (ref 0–149)
VLDL Cholesterol Cal: 25 mg/dL (ref 5–40)

## 2023-09-22 LAB — HEMOGLOBIN A1C
Est. average glucose Bld gHb Est-mCnc: 140 mg/dL
Hgb A1c MFr Bld: 6.5 % — ABNORMAL HIGH (ref 4.8–5.6)

## 2023-09-24 ENCOUNTER — Telehealth: Payer: Self-pay

## 2023-09-24 NOTE — Telephone Encounter (Signed)
 Copied from CRM [id number]. Topic: Clinical - Medication Question >> Sep 24, 2023 12:42 PM Sanjuana Crutch wrote: Reason for CRM: Anthony Lucas [phone] patients granddaughter calling in for follow up questions after appoitment concerning medication

## 2023-09-25 LAB — SPECIMEN STATUS REPORT

## 2023-09-25 LAB — URINE CULTURE

## 2023-09-27 ENCOUNTER — Ambulatory Visit (HOSPITAL_COMMUNITY)
Admission: RE | Admit: 2023-09-27 | Discharge: 2023-09-27 | Disposition: A | Discharge status: Home / Self Care | Source: Ambulatory Visit | Attending: Family Medicine | Admitting: Family Medicine

## 2023-09-27 DIAGNOSIS — I672 Cerebral atherosclerosis: Secondary | ICD-10-CM | POA: Diagnosis not present

## 2023-09-27 DIAGNOSIS — S0093XA Contusion of unspecified part of head, initial encounter: Secondary | ICD-10-CM | POA: Insufficient documentation

## 2023-09-27 DIAGNOSIS — I6523 Occlusion and stenosis of bilateral carotid arteries: Secondary | ICD-10-CM | POA: Diagnosis not present

## 2023-09-28 NOTE — Telephone Encounter (Signed)
 1.  I am not opposed to answering questions 2.  Need to clarify who is on DPR 3.  Need to clarify with the granddaughter that according to our records she is not on the DPR 4.  She could assist [place] with sending questions via MyChart or [place] can ask the questions

## 2023-10-01 NOTE — Addendum Note (Signed)
 Addended by: VICCI SELLER A on: 10/01/2023 02:40 PM   Modules accepted: Orders

## 2023-10-01 NOTE — Progress Notes (Signed)
 Remote pacemaker transmission.

## 2023-10-11 ENCOUNTER — Telehealth: Payer: Self-pay

## 2023-10-11 ENCOUNTER — Other Ambulatory Visit: Payer: Self-pay

## 2023-10-11 ENCOUNTER — Other Ambulatory Visit: Payer: Self-pay | Admitting: Urology

## 2023-10-11 ENCOUNTER — Other Ambulatory Visit

## 2023-10-11 DIAGNOSIS — I25118 Atherosclerotic heart disease of native coronary artery with other forms of angina pectoris: Secondary | ICD-10-CM

## 2023-10-11 DIAGNOSIS — I5022 Chronic systolic (congestive) heart failure: Secondary | ICD-10-CM

## 2023-10-11 DIAGNOSIS — R8290 Unspecified abnormal findings in urine: Secondary | ICD-10-CM

## 2023-10-11 DIAGNOSIS — R399 Unspecified symptoms and signs involving the genitourinary system: Secondary | ICD-10-CM

## 2023-10-11 DIAGNOSIS — R300 Dysuria: Secondary | ICD-10-CM | POA: Diagnosis not present

## 2023-10-11 DIAGNOSIS — I5020 Unspecified systolic (congestive) heart failure: Secondary | ICD-10-CM

## 2023-10-11 LAB — MICROSCOPIC EXAMINATION: WBC, UA: >30 /HPF — AB (ref 0–5)

## 2023-10-11 LAB — URINALYSIS, ROUTINE W REFLEX MICROSCOPIC
Bilirubin, UA: NEGATIVE
Glucose, UA: NEGATIVE
Ketones, UA: NEGATIVE
Nitrite, UA: POSITIVE — AB
Protein,UA: NEGATIVE
Specific Gravity, UA: 1.01 (ref 1.005–1.030)
Urobilinogen, Ur: 0.2 mg/dL (ref 0.2–1.0)
pH, UA: 6 (ref 5.0–7.5)

## 2023-10-11 MED ORDER — DOXYCYCLINE HYCLATE 100 MG PO CAPS: 100.0000 mg | ORAL_CAPSULE | Freq: Two times a day (BID) | ORAL | 0 refills | Status: DC

## 2023-10-11 NOTE — Telephone Encounter (Signed)
-----   Message from Lauraine JAYSON Oz sent at 10/11/2023  3:13 PM EDT ----- Please let patient know that per provider review of drop off urine specimen today: Abnormal / appears infected. Will treat empirically with antibiotic (Doxycycline ) while awaiting urine culture results. Prescription sent to pharmacy listed in patient chart.

## 2023-10-11 NOTE — Telephone Encounter (Signed)
 Dysuria  Patient called with c/o dysuria x 1 week days.  Pain: burning  Severity:10/10  Associated Signs and Symptoms:  Fever: noTemp. Chills: no Hematuria: no Urgency: yes Frequency: yes Hesitancy:yes Incontinence: yes Nausea: no Vomiting: no  Call:   [phone]                     ;

## 2023-10-11 NOTE — Telephone Encounter (Signed)
 Nurses I read over Cheryl's message Certainly all good points As for the urine culture-mixed urogenital flora is typically a sign that the urine might not have been a clean-catch meaning that some contaminants from outside the urethra got into the urine The urine can always be rechecked at the urologist office or at our office The procedure for a clean-catch urine is to wipe the end of the penis with a gentle wipe.  Proceed to urinate into the toilet and catch some urine midstream  Certainly if he is having UTI symptoms currently such as burning with urination I would recommend having Cherrelle bring him by the office to resubmit a urine for dipstick and spend as well as culture-try to do this on the day that I am in the office if possible  Lab work does show see Izetta which is chronic kidney disease which is very common at an older age but his condition is not severe.  The Entresto  actually helps protect him in regards to his kidneys and the heart.  The hemoglobin is slightly low but this is very similar to how it has been over the past 2 years and is typically a sign of what we refer to as anemia of chronic disease. (There is a relatively detailed explanation that can be given but the straightforward explanation is the bone marrow does not produces many red blood cells when we have chronic diseases such as heart failure, chronic kidney disease.  If the hemoglobin starts dropping into the low 11's or tens then further testing is warranted plus also consultation with hematology)  He does have a follow-up visit later - If at all possible I encouraged family to come with him for that visit.  If they feel he needs to be seen before then please let me know thank you-Dr. Glendia

## 2023-10-11 NOTE — Telephone Encounter (Signed)
 Patient is aware of NP's response and that abx was sent to pharmacy.  Patient voiced understanding.

## 2023-10-11 NOTE — Telephone Encounter (Signed)
 Patient added to lab for urine drop off- patient will call back if he cannot get here before 3:00pm

## 2023-10-14 ENCOUNTER — Ambulatory Visit: Payer: Self-pay | Admitting: Urology

## 2023-10-14 LAB — SPECIMEN STATUS REPORT

## 2023-10-14 LAB — URINE CULTURE

## 2023-10-17 ENCOUNTER — Encounter: Payer: Self-pay | Admitting: Family Medicine

## 2023-10-18 ENCOUNTER — Telehealth: Payer: Self-pay

## 2023-10-18 NOTE — Telephone Encounter (Signed)
 Due to pt not being symptomatic pt was scheduled for ov with NP [place] pt daughter voiced her understanding and confirmed appt

## 2023-10-18 NOTE — Telephone Encounter (Signed)
 Patient is has been passing light blood for 3 to 4 days. Finished antibiotic.  No UTI symptoms Feeling very weak. No temp. No Nausea or vomitting  Call:  [phone] - Channing - daughter

## 2023-10-18 NOTE — Telephone Encounter (Signed)
 Rosina phone number [phone]

## 2023-10-18 NOTE — Telephone Encounter (Signed)
 Daughter cheryl has left to go to physical therapy she is leaving permission for you to speak with Rosina - his granddaughter .

## 2023-10-19 NOTE — Progress Notes (Unsigned)
 Name: Anthony Lucas DOB: 1930-07-25 MRN: [id number]  History of Present Illness: Anthony Lucas is a 89 y.o. male who presents today for follow up visit at [facility].  Relevant History includes: 1. Prostate cancer.  2. LUTS (incomplete bladder emptying, nocturia x3-4 and nocturnal enuresis; wears Depends). - Taking Rapaflo  8 mg BID and Proscar  5 mg daily. 3. Recurrent UTI.  - Likely secondary to #2. - Taking Nitrofurantoin  50 mg daily for UTI prophylaxis. 4. Nocturnal enuresis.  At last visit on 01/13/2023: Doing well. We discussed option to obtain RUS to screen for hydronephrosis; he declined. Renal function has been relatively stable over the past 1-2 years per chart review.    Since last visit: > 10/11/2023: Urine culture positive for E. Coli. Treated with Doxycycline .   > 10/18/2023: Per phone note reported he has been passing light blood for 3 to 4 days. Finished antibiotic.SABRASABRAFeeling very weak.  Today: He reports that he finished the Doxycycline  and is feeling better at this point. Denies fevers, fatigue, or weakness. Reports that the hematuria seems to be resolving; currently states his urine is more clear pink-tinged; denies gross hematuria or passing blood clots. He denies increased urinary urgency, frequency, nocturia, dysuria, hesitancy, straining to void, or sensations of incomplete emptying. He denies flank pain or abdominal pain.  Medications: Current Outpatient Medications  Medication Sig Dispense Refill   allopurinol  (ZYLOPRIM ) 100 MG tablet TAKE 2 TABLETS BY MOUTH DAILY 60 tablet 6   apixaban  (ELIQUIS ) 5 MG TABS tablet TAKE 1 TABLET BY MOUTH TWICE A DAY. 60 tablet 5   blood glucose meter kit and supplies 1 each by Other route 4 (four) times daily -  before meals and at bedtime. 1 each 5   clobetasol cream (TEMOVATE) 0.05 % Apply 1 application topically 2 (two) times daily as needed (burns on hands).     docusate sodium (COLACE) 100 MG capsule Take 100  mg by mouth daily.     ENTRESTO  24-26 MG TAKE ONE TABLET BY MOUTH 2 TIMES A DAY 60 tablet 6   finasteride  (PROSCAR ) 5 MG tablet TAKE ONE TABLET BY MOUTH ONCE DAILY 90 tablet 3   metFORMIN  (GLUCOPHAGE ) 500 MG tablet TAKE ONE TABLET BY MOUTH DAILY. 90 tablet 2   metoprolol  succinate (TOPROL -XL) 25 MG 24 hr tablet TAKE 1 TABLET BY MOUTH TWICE DAILY. 180 tablet 3   nitrofurantoin  (MACRODANTIN ) 50 MG capsule TAKE 1 CAPSULE BY MOUTH ONCE AT BEDTIME. 30 capsule 11   ondansetron  (ZOFRAN ) 8 MG tablet Take 1 tablet (8 mg total) by mouth every 8 (eight) hours as needed for nausea. 15 tablet 0   potassium chloride  (KLOR-CON ) 10 MEQ tablet Take 10 mEq by mouth daily.     potassium chloride  (KLOR-CON ) 10 MEQ tablet TAKE (1) TABLET BY MOUTH ONCE DAILY. 90 tablet 3   silodosin  (RAPAFLO ) 8 MG CAPS capsule TAKE (1) CAPSULE BY MOUTH IN THE MORNING AND AT BEDTIME 60 capsule 11   simvastatin  (ZOCOR ) 40 MG tablet TAKE 1 TABLET BY MOUTH DAILY 90 tablet 3   torsemide  (DEMADEX ) 20 MG tablet Take 1 tablet (20 mg total) by mouth daily. 90 tablet 3   No current facility-administered medications for this visit.    Allergies: Allergies  Allergen Reactions   Bee Venom Anaphylaxis    Honey bee     Past Medical History:  Diagnosis Date   Arthritis    right knee (12/19/2015)   Chronic combined systolic (congestive) and diastolic (congestive) heart failure (  HCC)    a. EF 20-25% in 2017 b. EF at 50-55% in 2020   Complete heart block (HCC)    S/P BiVPPM   History of colon polyps    History of gout    Hyperlipidemia    Hypertension    Myocardial infarction [facility])    they said I'd had a heart attack but didn't know it (12/19/2015).  No CAD on cath   Seasonal allergies    Skin cancer    arms/hands (12/19/2015)   Sleep apnea    supposed to wear CPAP, but doesn't (12/19/2015)   Type II diabetes mellitus [facility])    Past Surgical History:  Procedure Laterality Date   BIV PACEMAKER GENERATOR CHANGEOUT N/A 11/14/2020    Procedure: BIV PACEMAKER GENERATOR CHANGEOUT;  Surgeon: Waddell Danelle ORN, MD;  Location: [facility] INVASIVE CV LAB;  Service: Cardiovascular;  Laterality: N/A;   CATARACT EXTRACTION W/PHACO Right 09/08/2012   Procedure: CATARACT EXTRACTION PHACO AND INTRAOCULAR LENS PLACEMENT (IOC);  Surgeon: Cherene Mania, MD;  Location: [facility] ORS;  Service: Ophthalmology;  Laterality: Right;  CDE: 21.28   CATARACT EXTRACTION W/PHACO Left 12/08/2012   Procedure: CATARACT EXTRACTION PHACO AND INTRAOCULAR LENS PLACEMENT (IOC);  Surgeon: Cherene Mania, MD;  Location: [facility] ORS;  Service: Ophthalmology;  Laterality: Left;  CDE:25.72   CHOLECYSTECTOMY OPEN  1968   COLONOSCOPY     4 procedures   COLONOSCOPY  2012   Dr. Jakie: diverticulosis, lipoma in ascending colon.    CYSTOSCOPY WITH FULGERATION N/A 08/11/2017   Procedure: CYSTOSCOPY WITH FULGERATION OF PROSTATIC VARICES;  Surgeon: Sherrilee Belvie CROME, MD;  Location: [facility];  Service: Urology;  Laterality: N/A;   EP IMPLANTABLE DEVICE N/A 12/19/2015   Procedure: BiV Pacemaker Insertion CRT-P;  Surgeon: Danelle ORN Waddell, MD;  Location[facility] INVASIVE CV LAB;  Service: Cardiovascular;  Laterality: N/A;   INSERT / REPLACE / REMOVE PACEMAKER  12/19/2015   JOINT REPLACEMENT Left    knee   LEAD REVISION/REPAIR N/A 11/14/2020   Procedure: LEAD REVISION/REPAIR;  Surgeon: Waddell Danelle ORN, MD;  Location: [facility] INVASIVE CV LAB;  Service: Cardiovascular;  Laterality: N/A;   POLYPECTOMY     TONSILLECTOMY     TOTAL KNEE ARTHROPLASTY Left 2000s   TRANSURETHRAL RESECTION OF PROSTATE     YAG LASER APPLICATION Right 06/16/2016   Procedure: YAG LASER APPLICATION;  Surgeon: Oneil Platts, MD;  Location: [facility];  Service: Ophthalmology;  Laterality: Right;   YAG LASER APPLICATION Left 06/30/2016   Procedure: YAG LASER APPLICATION;  Surgeon: Oneil Platts, MD;  Location: [facility];  Service: Ophthalmology;  Laterality: Left;   Family History  Problem Relation Age of Onset   Lung cancer Father    Colon cancer  Neg Hx    Social History   Socioeconomic History   Marital status: Widowed    Spouse name: Not on file   Number of children: Not on file   Years of education: Not on file   Highest education level: Not on file  Occupational History   Not on file  Tobacco Use   Smoking status: Never   Smokeless tobacco: Never  Vaping Use   Vaping status: Never Used  Substance and Sexual Activity   Alcohol use: Not Currently    Alcohol/week: 2.0 standard drinks of alcohol    Types: 2 Glasses of wine per week   Drug use: No   Sexual activity: Not Currently    Birth control/protection: None  Other Topics Concern   Not on file  Social History Narrative   Not on file   Social Drivers of Health   Financial Resource Strain: Low Risk  (01/01/2023)   Overall Financial Resource Strain (CARDIA)    Difficulty of Paying Living Expenses: Not hard at all  Food Insecurity: No Food Insecurity (01/01/2023)   Hunger Vital Sign    Worried About Running Out of Food in the Last Year: Never true    Ran Out of Food in the Last Year: Never true  Transportation Needs: No Transportation Needs (01/01/2023)   PRAPARE - Administrator, Civil Service (Medical): No    Lack of Transportation (Non-Medical): No  Physical Activity: Inactive (01/01/2023)   Exercise Vital Sign    Days of Exercise per Week: 0 days    Minutes of Exercise per Session: 0 min  Stress: No Stress Concern Present (01/01/2023)   [employer] of Occupational Health - Occupational Stress Questionnaire    Feeling of Stress : Not at all  Social Connections: Moderately Isolated (01/01/2023)   Social Connection and Isolation Panel    Frequency of Communication with Friends and Family: More than three times a week    Frequency of Social Gatherings with Friends and Family: More than three times a week    Attends Religious Services: More than 4 times per year    Active Member of [employer] or Organizations: No    Attends Banker  Meetings: Never    Marital Status: Widowed  Intimate Partner Violence: Not At Risk (01/01/2023)   Humiliation, Afraid, Rape, and Kick questionnaire    Fear of Current or Ex-Partner: No    Emotionally Abused: No    Physically Abused: No    Sexually Abused: No    Review of Systems Constitutional: Patient denies any unintentional weight loss or change in strength lntegumentary: Patient denies any rashes or pruritus Cardiovascular: Patient denies chest pain or syncope Respiratory: Patient denies shortness of breath Gastrointestinal: Patient denies nausea, vomiting, constipation, or diarrhea  Musculoskeletal: Patient denies muscle cramps or weakness Neurologic: Patient denies convulsions or seizures Allergic/Immunologic: Patient denies recent allergic reaction(s) Hematologic/Lymphatic: Patient denies bleeding tendencies Endocrine: Patient denies heat/cold intolerance  GU: As per HPI.  OBJECTIVE Vitals:   10/20/23 0954  BP: 109/66  Pulse: 69   There is no height or weight on file to calculate BMI.  Physical Examination Constitutional: No obvious distress; patient is non-toxic appearing  Cardiovascular: No visible lower extremity edema.  Respiratory: The patient does not have audible wheezing/stridor; respirations do not appear labored  Gastrointestinal: Abdomen non-distended Musculoskeletal: Normal ROM of UEs  Skin: No obvious rashes/open sores  Neurologic: CN 2-12 grossly intact Psychiatric: Answered questions appropriately with normal affect  Hematologic/Lymphatic/Immunologic: No obvious bruises or sites of spontaneous bleeding  Urine microscopy: >30 RBC/hpf, few bacteria PVR: 380 ml  ASSESSMENT Hematuria, unspecified type - Plan: Urinalysis, Routine w reflex microscopic, Urine Culture, US  RENAL  Lower urinary tract symptoms (LUTS) - Plan: Urinalysis, Routine w reflex microscopic, Urine Culture, US  RENAL  PROSTATE CANCER - Plan: Urinalysis, Routine w reflex microscopic,  US  RENAL  Benign prostatic hyperplasia with incomplete bladder emptying - Plan: BLADDER SCAN AMB NON-IMAGING, Urine Culture, US  RENAL  Incomplete emptying of bladder - Plan: BLADDER SCAN AMB NON-IMAGING, Urine Culture, US  RENAL  Recurrent UTI - Plan: Urine Culture, US  RENAL  He is feeling better today. Will repeat urine culture for recheck due to abnormal UA.   We discussed the risk for clot retention and pt was advised to maintain  adequate daily fluid intake to prevent / thin out clots.   His primary risk factor for recurrent UTIs is his incomplete bladder emptying, which continues to be a problem however his PVR is improved today compared to prior. Advised RUS to screen for hydronephrosis; he agreed.   Advised to continue Rapaflo  8 mg BID & Proscar  5 mg daily for BPH along with Nitrofurantoin  50 mg daily for UTI prophylaxis.   Will plan for follow up as previously scheduled in October 2025 or sooner if needed. Pt verbalized understanding and agreement. All questions were answered.  PLAN Advised the following: 1. Urine culture. 2. RUS this month. 3. For BPH: Continue Rapaflo  8 mg BID and Proscar  5 mg daily.  4. For UTI prophylaxis: Continue Nitrofurantoin  50 mg daily.  5. Return in 12 weeks (on 01/12/2024) for BPH with incomplete bladder emptying, with UA & PVR.  Orders Placed This Encounter  Procedures   Urine Culture   US  RENAL    Standing Status:   Future    Expected Date:   10/20/2023    Expiration Date:   10/19/2024    Reason for Exam (SYMPTOM  OR DIAGNOSIS REQUIRED):   kidney stone known or suspected    Preferred imaging location?:   [facility]   Urinalysis, Routine w reflex microscopic   BLADDER SCAN AMB NON-IMAGING   Total time spent caring for the patient today was over 30 minutes. This includes time spent on the date of the visit reviewing the patient's chart before the visit, time spent during the visit, and time spent after the visit on documentation. Over 50%  of that time was spent in face-to-face time with this patient for direct counseling. E&M based on time and complexity of medical decision making.  It has been explained that the patient is to follow regularly with their PCP in addition to all other providers involved in their care and to follow instructions provided by these respective offices. Patient advised to contact urology clinic if any urologic-pertaining questions, concerns, new symptoms or problems arise in the interim period.  There are no Patient Instructions on file for this visit.  Electronically signed by:  Lauraine JAYSON Oz, FNP   10/20/23    10:36 AM

## 2023-10-20 ENCOUNTER — Encounter: Payer: Self-pay | Admitting: Urology

## 2023-10-20 ENCOUNTER — Ambulatory Visit (INDEPENDENT_AMBULATORY_CARE_PROVIDER_SITE_OTHER): Admitting: Urology

## 2023-10-20 VITALS — BP 109/66 | HR 69

## 2023-10-20 DIAGNOSIS — N390 Urinary tract infection, site not specified: Secondary | ICD-10-CM | POA: Diagnosis not present

## 2023-10-20 DIAGNOSIS — R319 Hematuria, unspecified: Secondary | ICD-10-CM

## 2023-10-20 DIAGNOSIS — N401 Enlarged prostate with lower urinary tract symptoms: Secondary | ICD-10-CM

## 2023-10-20 DIAGNOSIS — C61 Malignant neoplasm of prostate: Secondary | ICD-10-CM | POA: Diagnosis not present

## 2023-10-20 DIAGNOSIS — R399 Unspecified symptoms and signs involving the genitourinary system: Secondary | ICD-10-CM

## 2023-10-20 DIAGNOSIS — R339 Retention of urine, unspecified: Secondary | ICD-10-CM

## 2023-10-20 DIAGNOSIS — R3914 Feeling of incomplete bladder emptying: Secondary | ICD-10-CM | POA: Diagnosis not present

## 2023-10-20 LAB — URINALYSIS, ROUTINE W REFLEX MICROSCOPIC
Bilirubin, UA: NEGATIVE
Glucose, UA: NEGATIVE
Nitrite, UA: POSITIVE — AB
Specific Gravity, UA: 1.02 (ref 1.005–1.030)
Urobilinogen, Ur: 2 mg/dL — ABNORMAL HIGH (ref 0.2–1.0)
pH, UA: 6.5 (ref 5.0–7.5)

## 2023-10-20 LAB — MICROSCOPIC EXAMINATION: RBC, Urine: >30 /HPF — AB (ref 0–2)

## 2023-10-20 LAB — BLADDER SCAN AMB NON-IMAGING: Scan Result: 380

## 2023-10-22 ENCOUNTER — Ambulatory Visit: Payer: Self-pay | Admitting: Urology

## 2023-10-22 LAB — URINE CULTURE: Organism ID, Bacteria: NO GROWTH

## 2023-10-29 NOTE — Telephone Encounter (Signed)
 Pt's daughter made aware of Lauraine response regarding urine culture and treatment.

## 2023-11-02 ENCOUNTER — Ambulatory Visit: Payer: Medicare Other | Admitting: Family Medicine

## 2023-11-02 ENCOUNTER — Encounter: Payer: Self-pay | Admitting: Family Medicine

## 2023-11-02 VITALS — BP 130/76 | HR 77 | Temp 98.0°F | Ht 73.0 in | Wt 186.0 lb

## 2023-11-02 DIAGNOSIS — E1169 Type 2 diabetes mellitus with other specified complication: Secondary | ICD-10-CM | POA: Diagnosis not present

## 2023-11-02 DIAGNOSIS — I5022 Chronic systolic (congestive) heart failure: Secondary | ICD-10-CM | POA: Diagnosis not present

## 2023-11-02 DIAGNOSIS — E785 Hyperlipidemia, unspecified: Secondary | ICD-10-CM

## 2023-11-02 DIAGNOSIS — I10 Essential (primary) hypertension: Secondary | ICD-10-CM

## 2023-11-02 DIAGNOSIS — Z23 Encounter for immunization: Secondary | ICD-10-CM | POA: Diagnosis not present

## 2023-11-02 MED ORDER — MIDODRINE HCL 2.5 MG PO TABS: ORAL_TABLET | ORAL | 5 refills | Status: DC

## 2023-11-02 NOTE — Progress Notes (Signed)
   Subjective:    Patient ID: Anthony Lucas, male    DOB: Jan 31, 1931, 89 y.o.   MRN: [id number]  HPI  DM 2 Hypertension Hyperlipidemia  Hypotension  Labs from June reviewed with patient CT scan reviewed with patient Granddaughter with him today Patient is doing a good job recently with healthy eating He is very weak has a hard time walking around even with a cane Gets out of breath with activity Gets dizzy when he stands up Has a history of heart failure On medications which the cardiologist recommends continuing the    Review of Systems     Objective:   Physical Exam  General-in no acute distress Eyes-no discharge Lungs-respiratory rate normal, CTA CV-no murmurs,RRR Extremities skin warm dry no edema Neuro grossly normal Behavior normal, alert  Previous labs and CAT scan reviewed with patient and family     Assessment & Plan:  1. Immunization due (Primary) Today - Pneumococcal conjugate vaccine 20-valent  2. Chronic systolic heart failure (HCC) Stable continue current medications   3. Essential hypertension Blood pressure is actually on the low end Cardiologist wants him to continue Entresto  Therefore start low-dose midodrine  2.5 mg twice daily Follow-up in 4 to 6 weeks to recheck blood pressure Warning signs discussed notify us  of any problems  4. Controlled type 2 diabetes mellitus with other specified complication, without long-term current use of insulin (HCC) A1c under decent control continue current measures we will follow-up 6 months on this  5. Hyperlipidemia associated with type 2 diabetes mellitus (HCC) Doing well with this medicine follow-up 6 months on this  Family to notify us  if any problems Family can send us  an update within 2 weeks how his blood pressure is doing

## 2023-11-08 ENCOUNTER — Ambulatory Visit (HOSPITAL_COMMUNITY)
Admission: RE | Admit: 2023-11-08 | Discharge: 2023-11-08 | Disposition: A | Discharge status: Home / Self Care | Source: Ambulatory Visit | Attending: Urology | Admitting: Urology

## 2023-11-08 DIAGNOSIS — N390 Urinary tract infection, site not specified: Secondary | ICD-10-CM | POA: Insufficient documentation

## 2023-11-08 DIAGNOSIS — R3914 Feeling of incomplete bladder emptying: Secondary | ICD-10-CM | POA: Diagnosis not present

## 2023-11-08 DIAGNOSIS — R399 Unspecified symptoms and signs involving the genitourinary system: Secondary | ICD-10-CM | POA: Diagnosis not present

## 2023-11-08 DIAGNOSIS — R339 Retention of urine, unspecified: Secondary | ICD-10-CM | POA: Diagnosis not present

## 2023-11-08 DIAGNOSIS — R319 Hematuria, unspecified: Secondary | ICD-10-CM | POA: Insufficient documentation

## 2023-11-08 DIAGNOSIS — N401 Enlarged prostate with lower urinary tract symptoms: Secondary | ICD-10-CM | POA: Diagnosis not present

## 2023-11-08 DIAGNOSIS — C61 Malignant neoplasm of prostate: Secondary | ICD-10-CM | POA: Insufficient documentation

## 2023-11-09 ENCOUNTER — Telehealth: Payer: Self-pay | Admitting: Urology

## 2023-11-09 NOTE — Telephone Encounter (Signed)
 She realized this was 14 years ago

## 2023-11-09 NOTE — Telephone Encounter (Signed)
 Daughter wants to know about prostate cancer..he/him/his took over his care and wants to know if he still has prostate cancer or if something was done

## 2023-11-16 ENCOUNTER — Ambulatory Visit (INDEPENDENT_AMBULATORY_CARE_PROVIDER_SITE_OTHER)

## 2023-11-16 DIAGNOSIS — I428 Other cardiomyopathies: Secondary | ICD-10-CM | POA: Diagnosis not present

## 2023-11-17 LAB — CUP PACEART REMOTE DEVICE CHECK
Battery Remaining Longevity: 105 mo
Battery Voltage: 3 V
Brady Statistic RA Percent Paced: 0 %
Brady Statistic RV Percent Paced: 94.59 %
Date Time Interrogation Session: 20250812012113
Implantable Lead Connection Status: 753985
Implantable Lead Connection Status: 753985
Implantable Lead Connection Status: 753985
Implantable Lead Implant Date: 20170914
Implantable Lead Implant Date: 20170914
Implantable Lead Implant Date: 20220811
Implantable Lead Location: 753858
Implantable Lead Location: 753859
Implantable Lead Location: 753860
Implantable Lead Model: 3830
Implantable Lead Model: 5076
Implantable Lead Model: 5076
Implantable Pulse Generator Implant Date: 20220811
Lead Channel Impedance Value: 247 Ohm
Lead Channel Impedance Value: 285 Ohm
Lead Channel Impedance Value: 342 Ohm
Lead Channel Impedance Value: 342 Ohm
Lead Channel Impedance Value: 399 Ohm
Lead Channel Impedance Value: 456 Ohm
Lead Channel Impedance Value: 456 Ohm
Lead Channel Impedance Value: 475 Ohm
Lead Channel Impedance Value: 494 Ohm
Lead Channel Pacing Threshold Amplitude: 0.5 V
Lead Channel Pacing Threshold Amplitude: 0.625 V
Lead Channel Pacing Threshold Pulse Width: 0.4 ms
Lead Channel Pacing Threshold Pulse Width: 0.5 ms
Lead Channel Sensing Intrinsic Amplitude: 1.125 mV
Lead Channel Sensing Intrinsic Amplitude: 1.125 mV
Lead Channel Sensing Intrinsic Amplitude: 9.125 mV
Lead Channel Sensing Intrinsic Amplitude: 9.125 mV
Lead Channel Setting Pacing Amplitude: 1 V
Lead Channel Setting Pacing Amplitude: 2 V
Lead Channel Setting Pacing Pulse Width: 0.4 ms
Lead Channel Setting Pacing Pulse Width: 0.5 ms
Lead Channel Setting Sensing Sensitivity: 2.8 mV
Zone Setting Status: 755011
Zone Setting Status: 755011

## 2023-11-18 ENCOUNTER — Ambulatory Visit: Payer: Self-pay | Admitting: Internal Medicine

## 2023-11-18 DIAGNOSIS — B351 Tinea unguium: Secondary | ICD-10-CM | POA: Diagnosis not present

## 2023-11-18 DIAGNOSIS — E1142 Type 2 diabetes mellitus with diabetic polyneuropathy: Secondary | ICD-10-CM | POA: Diagnosis not present

## 2023-11-24 ENCOUNTER — Ambulatory Visit: Admitting: Urology

## 2023-11-24 ENCOUNTER — Encounter: Payer: Self-pay | Admitting: Family Medicine

## 2023-11-25 ENCOUNTER — Other Ambulatory Visit: Payer: Self-pay | Admitting: Family Medicine

## 2023-11-26 ENCOUNTER — Other Ambulatory Visit: Payer: Self-pay | Admitting: Family Medicine

## 2023-11-26 MED ORDER — MIDODRINE HCL 2.5 MG PO TABS: ORAL_TABLET | ORAL | 5 refills | Status: DC

## 2023-12-07 ENCOUNTER — Ambulatory Visit: Admitting: Urology

## 2023-12-13 ENCOUNTER — Ambulatory Visit (INDEPENDENT_AMBULATORY_CARE_PROVIDER_SITE_OTHER): Admitting: Urology

## 2023-12-13 ENCOUNTER — Encounter: Payer: Self-pay | Admitting: Urology

## 2023-12-13 VITALS — BP 117/68 | HR 77

## 2023-12-13 DIAGNOSIS — N390 Urinary tract infection, site not specified: Secondary | ICD-10-CM

## 2023-12-13 DIAGNOSIS — Z87440 Personal history of urinary (tract) infections: Secondary | ICD-10-CM | POA: Diagnosis not present

## 2023-12-13 DIAGNOSIS — N401 Enlarged prostate with lower urinary tract symptoms: Secondary | ICD-10-CM

## 2023-12-13 DIAGNOSIS — Z85828 Personal history of other malignant neoplasm of skin: Secondary | ICD-10-CM | POA: Diagnosis not present

## 2023-12-13 DIAGNOSIS — Z08 Encounter for follow-up examination after completed treatment for malignant neoplasm: Secondary | ICD-10-CM | POA: Diagnosis not present

## 2023-12-13 DIAGNOSIS — X32XXXD Exposure to sunlight, subsequent encounter: Secondary | ICD-10-CM | POA: Diagnosis not present

## 2023-12-13 DIAGNOSIS — R3914 Feeling of incomplete bladder emptying: Secondary | ICD-10-CM | POA: Diagnosis not present

## 2023-12-13 DIAGNOSIS — R339 Retention of urine, unspecified: Secondary | ICD-10-CM | POA: Diagnosis not present

## 2023-12-13 DIAGNOSIS — L570 Actinic keratosis: Secondary | ICD-10-CM | POA: Diagnosis not present

## 2023-12-13 LAB — URINALYSIS, ROUTINE W REFLEX MICROSCOPIC
Bilirubin, UA: NEGATIVE
Glucose, UA: NEGATIVE
Ketones, UA: NEGATIVE
Nitrite, UA: NEGATIVE
Specific Gravity, UA: 1.02 (ref 1.005–1.030)
Urobilinogen, Ur: 0.2 mg/dL (ref 0.2–1.0)
pH, UA: 6 (ref 5.0–7.5)

## 2023-12-13 LAB — MICROSCOPIC EXAMINATION
RBC, Urine: >30 /HPF — AB (ref 0–2)
WBC, UA: >30 /HPF — AB (ref 0–5)

## 2023-12-13 LAB — BLADDER SCAN AMB NON-IMAGING: Scan Result: 309

## 2023-12-13 MED ORDER — GEMTESA 75 MG PO TABS: 1.0000 | ORAL_TABLET | Freq: Every day | ORAL | Status: AC

## 2023-12-13 MED ORDER — SILODOSIN 8 MG PO CAPS: 8.0000 mg | ORAL_CAPSULE | Freq: Two times a day (BID) | ORAL | 11 refills | Status: AC

## 2023-12-13 MED ORDER — NITROFURANTOIN MACROCRYSTAL 50 MG PO CAPS: 50.0000 mg | ORAL_CAPSULE | Freq: Every day | ORAL | 11 refills | Status: AC

## 2023-12-13 NOTE — Progress Notes (Signed)
 12/13/2023 9:17 AM   Anthony Lucas Aug 23, 1930 [id number]  Referring provider: Alphonsa Lucas LABOR, MD [address] [place],  [place] [address]  No chief complaint on file.   HPI: Anthony Lucas is a 89yo here for followup for BPh with incomplete emptying and nocturia. UA today is concerning for infection. PVR 309cc. Nocturia 4-5x which are small volumes. No hematuria or dysuria. He uses 2 pads per day and 2 pads per night. Uirne stream is mostly strong.    PMH: Past Medical History:  Diagnosis Date   Arthritis    right knee (12/19/2015)   Chronic combined systolic (congestive) and diastolic (congestive) heart failure (HCC)    a. EF 20-25% in 2017 b. EF at 50-55% in 2020   Complete heart block (HCC)    S/P BiVPPM   History of colon polyps    History of gout    Hyperlipidemia    Hypertension    Myocardial infarction [facility])    they said I'd had a heart attack but didn't know it (12/19/2015).  No CAD on cath   Seasonal allergies    Skin cancer    arms/hands (12/19/2015)   Sleep apnea    supposed to wear CPAP, but doesn't (12/19/2015)   Type II diabetes mellitus [facility])     Surgical History: Past Surgical History:  Procedure Laterality Date   BIV PACEMAKER GENERATOR CHANGEOUT N/A 11/14/2020   Procedure: BIV PACEMAKER GENERATOR CHANGEOUT;  Surgeon: Waddell Danelle ORN, MD;  Location: [facility] INVASIVE [facility];  Service: Cardiovascular;  Laterality: N/A;   CATARACT EXTRACTION W/PHACO Right 09/08/2012   Procedure: CATARACT EXTRACTION PHACO AND INTRAOCULAR LENS PLACEMENT (IOC);  Surgeon: Cherene Mania, MD;  Location: [facility] OR[facility];  Service: Ophthalmology;  Laterality: Right;  CDE: 21.28   CATARACT EXTRACTION W/PHACO Left 12/08/2012   Procedure: CATARACT EXTRACTION PHACO AND INTRAOCULAR LENS PLACEMENT (IOC);  Surgeon: Cherene Mania, MD;  Location: [facility];  Service: Ophthalmology;  Laterality: Left;  CDE:25.72   CHOLECYSTECTOMY OPEN  1968   COLONOSCOPY     4 procedures   COLONOSCOPY  2012   Dr.  Jakie: diverticulosis, lipoma in ascending colon.    CYSTOSCOPY WITH FULGERATION N/A 08/11/2017   Procedure: CYSTOSCOPY WITH FULGERATION OF PROSTATIC VARICES;  Surgeon: Sherrilee Belvie CROME, MD;  Location: [facility];  Service: Urology;  Laterality: N/A;   EP IMPLANTABLE DEVICE N/A 12/19/2015   Procedure: BiV Pacemaker Insertion CRT-P;  Surgeon: Danelle ORN Waddell, MD;  Location: [facility] INVASIVE CV LAB;  Service: Cardiovascular;  Laterality: N/A;   INSERT / REPLACE / REMOVE PACEMAKER  12/19/2015   JOINT REPLACEMENT Left    knee   LEAD REVISION/REPAIR N/A 11/14/2020   Procedure: LEAD REVISION/REPAIR;  Surgeon: Waddell Danelle ORN, MD;  Location: [facility];  Service: Cardiovascular;  Laterality: N/A;   POLYPECTOMY     TONSILLECTOMY     TOTAL KNEE ARTHROPLASTY Left 2000s   TRANSURETHRAL RESECTION OF PROSTATE     YAG LASER APPLICATION Right 06/16/2016   Procedure: YAG LASER APPLICATION;  Surgeon: Oneil Platts, MD;  Location: [facility];  Service: Ophthalmology;  Laterality: Right;   YAG LASER APPLICATION Left 06/30/2016   Procedure: YAG LASER APPLICATION;  Surgeon: Oneil Platts, MD;  Location: [facility];  Service: Ophthalmology;  Laterality: Left;    Home Medications:  Allergies as of 12/13/2023       Reactions   Bee Venom Anaphylaxis   Honey bee         Medication List  Accurate as of December 13, 2023  9:17 AM. If you have any questions, ask your nurse or doctor.          allopurinol  100 MG tablet Commonly known as: ZYLOPRIM  TAKE 2 TABLETS BY MOUTH DAILY   blood glucose meter kit and supplies 1 each by Other route 4 (four) times daily -  before meals and at bedtime.   clobetasol cream 0.05 % Commonly known as: TEMOVATE Apply 1 application topically 2 (two) times daily as needed (burns on hands).   docusate sodium 100 MG capsule Commonly known as: COLACE Take 100 mg by mouth daily.   Eliquis  5 MG Tabs tablet Generic drug: apixaban  TAKE 1 TABLET BY MOUTH TWICE A DAY.    Entresto  24-26 MG Generic drug: sacubitril -valsartan  TAKE ONE TABLET BY MOUTH 2 TIMES A DAY   finasteride  5 MG tablet Commonly known as: PROSCAR  TAKE ONE TABLET BY MOUTH ONCE DAILY   metFORMIN  500 MG tablet Commonly known as: GLUCOPHAGE  TAKE ONE TABLET BY MOUTH DAILY.   metoprolol  succinate 25 MG 24 hr tablet Commonly known as: TOPROL -XL TAKE 1 TABLET BY MOUTH TWICE DAILY.   midodrine  2.5 MG tablet Commonly known as: PROAMATINE  One TID every 8 hours   nitrofurantoin  50 MG capsule Commonly known as: MACRODANTIN  TAKE 1 CAPSULE BY MOUTH ONCE AT BEDTIME.   ondansetron  8 MG tablet Commonly known as: Zofran  Take 1 tablet (8 mg total) by mouth every 8 (eight) hours as needed for nausea.   potassium chloride  10 MEQ tablet Commonly known as: KLOR-CON  M Take 10 mEq by mouth daily.   potassium chloride  10 MEQ tablet Commonly known as: KLOR-CON  TAKE (1) TABLET BY MOUTH ONCE DAILY.   silodosin  8 MG Caps capsule Commonly known as: RAPAFLO  TAKE (1) CAPSULE BY MOUTH IN THE MORNING AND AT BEDTIME   simvastatin  40 MG tablet Commonly known as: ZOCOR  TAKE 1 TABLET BY MOUTH DAILY   torsemide  20 MG tablet Commonly known as: DEMADEX  Take 1 tablet (20 mg total) by mouth daily.        Allergies:  Allergies  Allergen Reactions   Bee Venom Anaphylaxis    Honey bee     Family History: Family History  Problem Relation Age of Onset   Lung cancer Father    Colon cancer Neg Hx     Social History:  reports that he has never smoked. He has never used smokeless tobacco. He reports that he does not currently use alcohol after a past usage of about 2.0 standard drinks of alcohol per week. He reports that he does not use drugs.  ROS: All other review of systems were reviewed and are negative except what is noted above in HPI  Physical Exam: BP 117/68   Pulse 77   Constitutional:  Alert and oriented, No acute distress. HEENT: [place] AT, moist mucus membranes.  Trachea midline, no  masses. Cardiovascular: No clubbing, cyanosis, or edema. Respiratory: Normal respiratory effort, no increased work of breathing. GI: Abdomen is soft, nontender, nondistended, no abdominal masses GU: No CVA tenderness.  Lymph: No cervical or inguinal lymphadenopathy. Skin: No rashes, bruises or suspicious lesions. Neurologic: Grossly intact, no focal deficits, moving all 4 extremities. Psychiatric: Normal mood and affect.  Laboratory Data: Lab Results  Component Value Date   WBC 9.1 09/21/2023   HGB 12.5 (L) 09/21/2023   HCT 39.4 09/21/2023   MCV 91 09/21/2023   PLT 183 09/21/2023    Lab Results  Component Value Date   CREATININE 1.46 (  H) 09/21/2023    No results found for: PSA  No results found for: TESTOSTERONE  Lab Results  Component Value Date   HGBA1C 6.5 (H) 09/21/2023    Urinalysis    Component Value Date/Time   APPEARANCEUR Clear 10/20/2023 0959   GLUCOSEU Negative 10/20/2023 0959   BILIRUBINUR Negative 10/20/2023 0959   KETONESUR negative 09/21/2023 1545   PROTEINUR 3+ (A) 10/20/2023 0959   UROBILINOGEN 0.2 09/21/2023 1545   NITRITE Positive (A) 10/20/2023 0959   LEUKOCYTESUR 1+ (A) 10/20/2023 0959    Lab Results  Component Value Date   LABMICR See below: 10/20/2023   WBCUA 6-10 (A) 10/20/2023   LABEPIT 0-10 10/20/2023   BACTERIA Few (A) 10/20/2023    Pertinent Imaging:  No results found for this or any previous visit.  No results found for this or any previous visit.  No results found for this or any previous visit.  No results found for this or any previous visit.  Results for orders placed during the hospital encounter of 11/08/23  US  RENAL  Narrative CLINICAL DATA:  Hematuria.  Recurrent UTI.  EXAM: RENAL / URINARY TRACT ULTRASOUND COMPLETE  COMPARISON:  CT abdomen and pelvis 07/23/2017  FINDINGS: Right Kidney:  Renal measurements: 10.1 x 4.6 x 6.1 cm = volume: 150 mL. Echogenicity within normal limits. No mass or  hydronephrosis visualized.  Left Kidney:  Renal measurements: 10.8 x 5.3 x 5.1 cm = volume: 152 mL. Echogenicity within normal limits. No mass or hydronephrosis visualized. 3.5 cm simple cyst in the upper pole does not require dedicated imaging follow-up.  Bladder:  Diffuse irregular thickening of the bladder wall. Debris noted layering in the dependent portion of the bladder.  Other:  Markedly enlarged prostate.  IMPRESSION: 1. No significant abnormality of the kidneys. 2. Unchanged diffuse bladder wall thickening most likely due to chronic outlet obstruction given enlarged prostate.   Electronically Signed By: Aliene Lloyd M.D. On: 11/19/2023 07:42  No results found for this or any previous visit.  No results found for this or any previous visit.  No results found for this or any previous visit.   Assessment & Plan:    1. Benign prostatic hyperplasia with incomplete bladder emptying (Primary) -continue rapaflo  8mg  BID - Urinalysis, Routine w reflex microscopic - BLADDER SCAN AMB NON-IMAGING  2. Incomplete emptying of bladder Continue rapaflo  8mg  BID  3. Nocturia -we will trial gemtesa  75mg  daily  4. Frequent UTIs -urine for culture Continue macrodantin  50mg  qhs   No follow-ups on file.  Belvie Clara, MD  [facility] [place]

## 2023-12-13 NOTE — Patient Instructions (Signed)

## 2023-12-16 LAB — URINE CULTURE

## 2023-12-17 ENCOUNTER — Ambulatory Visit: Payer: Self-pay

## 2023-12-20 ENCOUNTER — Other Ambulatory Visit: Payer: Self-pay | Admitting: Urology

## 2023-12-20 DIAGNOSIS — R339 Retention of urine, unspecified: Secondary | ICD-10-CM

## 2023-12-30 NOTE — Progress Notes (Signed)
 Remote PPM Transmission

## 2024-01-07 ENCOUNTER — Ambulatory Visit: Payer: Medicare Other

## 2024-01-07 VITALS — Ht 73.0 in | Wt 186.0 lb

## 2024-01-07 DIAGNOSIS — Z0000 Encounter for general adult medical examination without abnormal findings: Secondary | ICD-10-CM | POA: Diagnosis not present
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
## 2024-01-07 NOTE — Patient Instructions (Signed)
 Anthony Lucas,  Thank you for taking the time for your Medicare Wellness Visit. I appreciate your continued commitment to your health goals. Please review the care plan we discussed, and feel free to reach out if I can assist you further.  Medicare recommends these wellness visits once per year to help you and your care team stay ahead of potential health issues. These visits are designed to focus on prevention, allowing your provider to concentrate on managing your acute and chronic conditions during your regular appointments.  Please note that Annual Wellness Visits do not include a physical exam. Some assessments may be limited, especially if the visit was conducted virtually. If needed, we may recommend a separate in-person follow-up with your provider.  Ongoing Care Seeing your primary care provider every 3 to 6 months helps us  monitor your health and provide consistent, personalized care.   Referrals If a referral was made during today's visit and you haven't received any updates within two weeks, please contact the referred provider directly to check on the status.  Recommended Screenings:  Health Maintenance  Topic Date Due   COVID-19 Vaccine (3 - Pfizer risk series) 02/29/2020   Complete foot exam   04/03/2023   Flu Shot  11/05/2023   Zoster (Shingles) Vaccine (1 of 2) 02/02/2024*   Eye exam for diabetics  05/31/2024*   DTaP/Tdap/Td vaccine (1 - Tdap) 11/01/2024*   Hemoglobin A1C  03/22/2024   Medicare Annual Wellness Visit  01/06/2025   Pneumococcal Vaccine for age over 66  Completed   HPV Vaccine  Aged Out   Meningitis B Vaccine  Aged Out  *Topic was postponed. The date shown is not the original due date.       01/07/2024    1:01 PM  Advanced Directives  Does Patient Have a Medical Advance Directive? No  Would patient like information on creating a medical advance directive? Yes (MAU/Ambulatory/Procedural Areas - Information given)   Advance Care Planning is important  because it: Ensures you receive medical care that aligns with your values, goals, and preferences. Provides guidance to your family and loved ones, reducing the emotional burden of decision-making during critical moments.  Information on Advanced Care Planning can be found at [place]  Secretary of [facility] Advance Health Care Directives Advance Health Care Directives ([email or web address]   Vision: Annual vision screenings are recommended for early detection of glaucoma, cataracts, and diabetic retinopathy. These exams can also reveal signs of chronic conditions such as diabetes and high blood pressure.  Dental: Annual dental screenings help detect early signs of oral cancer, gum disease, and other conditions linked to overall health, including heart disease and diabetes.  Please see the attached documents for additional preventive care recommendations.

## 2024-01-10 ENCOUNTER — Other Ambulatory Visit: Payer: Self-pay | Admitting: Cardiology

## 2024-01-12 ENCOUNTER — Ambulatory Visit: Payer: Medicare Other | Admitting: Urology

## 2024-01-14 ENCOUNTER — Ambulatory Visit: Admitting: Urology

## 2024-01-17 ENCOUNTER — Telehealth: Payer: Self-pay | Admitting: Urology

## 2024-01-17 ENCOUNTER — Other Ambulatory Visit

## 2024-01-17 DIAGNOSIS — N390 Urinary tract infection, site not specified: Secondary | ICD-10-CM

## 2024-01-17 LAB — URINALYSIS, ROUTINE W REFLEX MICROSCOPIC
Bilirubin, UA: NEGATIVE
Glucose, UA: NEGATIVE
Ketones, UA: NEGATIVE
Nitrite, UA: NEGATIVE
Specific Gravity, UA: 1.015 (ref 1.005–1.030)
Urobilinogen, Ur: 0.2 mg/dL (ref 0.2–1.0)
pH, UA: 6 (ref 5.0–7.5)

## 2024-01-17 LAB — MICROSCOPIC EXAMINATION
RBC, Urine: >30 /HPF — AB (ref 0–2)
WBC, UA: >30 /HPF — AB (ref 0–5)

## 2024-01-17 MED ORDER — DOXYCYCLINE HYCLATE 100 MG PO CAPS: 100.0000 mg | ORAL_CAPSULE | Freq: Two times a day (BID) | ORAL | 0 refills | Status: DC

## 2024-01-17 NOTE — Telephone Encounter (Signed)
 Patient called into the office today with general questions/concerns regarding passing a lot of blood and having pain Patient may be reached at [phone] to discuss questions.

## 2024-01-17 NOTE — Telephone Encounter (Signed)
 Return call to pt's daughter and she state's pt has many UTI. Pt was putting on nightly abt Macrodation 50 mg.  Daughter state's pt had blood on the sheets, floors, and in the bathroom. Pt drink a lot of fluids daily and is in pain in the lower stomach.  Dysuria  Patient called with c/o dysuria x 1 week days.  Pain: burning  Severity:5/10 come and goes  Associated Signs and Symptoms:  Fever: noTemp.0 Chills: yes always cold Hematuria: yes Urgency: yes Frequency: no Hesitancy:yes Incontinence: yes Nausea: no Vomiting: no  Urologic History:  Any Recent Urologic Surgeries or Procedures:no Recurrent UTI's:yes Cystitis: no  Prostatitis:no Kidney or Bladder Stones: no Plan: Walk-in Clinic: no Appointment w/Physician: [no Lab visit scheduled for urine drop off: Yes Advice given:  Do you take on daily medications for UTI suppression Yes Macrodantin 

## 2024-01-17 NOTE — Telephone Encounter (Signed)
 Patient presents today with complaints of  Urgency, Hesitancy, and incontinent .  UA and Culture done today.  Dr. Sherrilee  reviewed results and Doxycycline  100 mg BID X 7 days  .  Patient aware of MD recommendations and that we will reach out with culture results.    Pt did not answer and left vm   [place], CMA

## 2024-01-21 ENCOUNTER — Ambulatory Visit: Payer: Self-pay

## 2024-01-21 LAB — URINE CULTURE

## 2024-01-21 MED ORDER — CIPROFLOXACIN HCL 250 MG PO TABS: 250.0000 mg | ORAL_TABLET | Freq: Two times a day (BID) | ORAL | 0 refills | Status: AC

## 2024-01-21 NOTE — Telephone Encounter (Signed)
 Daughter made aware of patients positive urine culture. Daughter advised for patient to stop doxycyline and start Cipro sent to pharmacy per Dr. Sherrilee. Daughter voiced understanding.

## 2024-02-03 DIAGNOSIS — E1142 Type 2 diabetes mellitus with diabetic polyneuropathy: Secondary | ICD-10-CM | POA: Diagnosis not present

## 2024-02-03 DIAGNOSIS — B351 Tinea unguium: Secondary | ICD-10-CM | POA: Diagnosis not present

## 2024-02-07 DIAGNOSIS — L570 Actinic keratosis: Secondary | ICD-10-CM | POA: Diagnosis not present

## 2024-02-07 DIAGNOSIS — X32XXXD Exposure to sunlight, subsequent encounter: Secondary | ICD-10-CM | POA: Diagnosis not present

## 2024-02-15 ENCOUNTER — Ambulatory Visit

## 2024-02-15 DIAGNOSIS — I428 Other cardiomyopathies: Secondary | ICD-10-CM

## 2024-02-15 LAB — CUP PACEART REMOTE DEVICE CHECK
Battery Remaining Longevity: 102 mo
Battery Voltage: 3 V
Brady Statistic RA Percent Paced: 0 %
Brady Statistic RV Percent Paced: 97.52 %
Date Time Interrogation Session: 20251111002110
Implantable Lead Connection Status: 753985
Implantable Lead Connection Status: 753985
Implantable Lead Connection Status: 753985
Implantable Lead Implant Date: 20170914
Implantable Lead Implant Date: 20170914
Implantable Lead Implant Date: 20220811
Implantable Lead Location: 753858
Implantable Lead Location: 753859
Implantable Lead Location: 753860
Implantable Lead Model: 3830
Implantable Lead Model: 5076
Implantable Lead Model: 5076
Implantable Pulse Generator Implant Date: 20220811
Lead Channel Impedance Value: 247 Ohm
Lead Channel Impedance Value: 266 Ohm
Lead Channel Impedance Value: 342 Ohm
Lead Channel Impedance Value: 342 Ohm
Lead Channel Impedance Value: 399 Ohm
Lead Channel Impedance Value: 437 Ohm
Lead Channel Impedance Value: 456 Ohm
Lead Channel Impedance Value: 475 Ohm
Lead Channel Impedance Value: 494 Ohm
Lead Channel Pacing Threshold Amplitude: 0.375 V
Lead Channel Pacing Threshold Amplitude: 0.625 V
Lead Channel Pacing Threshold Pulse Width: 0.4 ms
Lead Channel Pacing Threshold Pulse Width: 0.5 ms
Lead Channel Sensing Intrinsic Amplitude: 2 mV
Lead Channel Sensing Intrinsic Amplitude: 2 mV
Lead Channel Sensing Intrinsic Amplitude: 4.625 mV
Lead Channel Sensing Intrinsic Amplitude: 4.625 mV
Lead Channel Setting Pacing Amplitude: 1 V
Lead Channel Setting Pacing Amplitude: 2 V
Lead Channel Setting Pacing Pulse Width: 0.4 ms
Lead Channel Setting Pacing Pulse Width: 0.5 ms
Lead Channel Setting Sensing Sensitivity: 2.8 mV
Zone Setting Status: 755011
Zone Setting Status: 755011

## 2024-02-18 NOTE — Progress Notes (Signed)
 Remote PPM Transmission

## 2024-02-20 ENCOUNTER — Ambulatory Visit: Payer: Self-pay | Admitting: Internal Medicine

## 2024-02-23 ENCOUNTER — Ambulatory Visit: Admitting: Family Medicine

## 2024-02-23 ENCOUNTER — Encounter: Payer: Self-pay | Admitting: Family Medicine

## 2024-02-23 VITALS — BP 130/70 | HR 94 | Ht 73.0 in | Wt 184.0 lb

## 2024-02-23 DIAGNOSIS — Z23 Encounter for immunization: Secondary | ICD-10-CM | POA: Diagnosis not present

## 2024-02-23 DIAGNOSIS — I10 Essential (primary) hypertension: Secondary | ICD-10-CM

## 2024-02-23 DIAGNOSIS — Z79899 Other long term (current) drug therapy: Secondary | ICD-10-CM

## 2024-02-23 DIAGNOSIS — E785 Hyperlipidemia, unspecified: Secondary | ICD-10-CM | POA: Diagnosis not present

## 2024-02-23 DIAGNOSIS — I5022 Chronic systolic (congestive) heart failure: Secondary | ICD-10-CM

## 2024-02-23 DIAGNOSIS — D509 Iron deficiency anemia, unspecified: Secondary | ICD-10-CM

## 2024-02-23 DIAGNOSIS — E1169 Type 2 diabetes mellitus with other specified complication: Secondary | ICD-10-CM | POA: Diagnosis not present

## 2024-02-23 DIAGNOSIS — E119 Type 2 diabetes mellitus without complications: Secondary | ICD-10-CM
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
## 2024-02-23 NOTE — Patient Instructions (Signed)
 Recommended to get Shingles vaccine at local pharmacy.

## 2024-02-25 ENCOUNTER — Other Ambulatory Visit: Payer: Self-pay | Admitting: Student

## 2024-03-05 ENCOUNTER — Other Ambulatory Visit: Payer: Self-pay | Admitting: Internal Medicine

## 2024-03-05 ENCOUNTER — Other Ambulatory Visit: Payer: Self-pay | Admitting: Family Medicine

## 2024-03-05 DIAGNOSIS — I4891 Unspecified atrial fibrillation: Secondary | ICD-10-CM

## 2024-03-06 NOTE — Telephone Encounter (Signed)
 Prescription refill request for Eliquis  received. Indication:afib Last office visit:6/25 Scr: 1.46  6/25 Age:89 Weight:83.5  kg  Prescription refilled

## 2024-03-08 ENCOUNTER — Other Ambulatory Visit: Payer: Self-pay

## 2024-03-08 ENCOUNTER — Other Ambulatory Visit

## 2024-03-08 ENCOUNTER — Telehealth: Payer: Self-pay

## 2024-03-08 DIAGNOSIS — N390 Urinary tract infection, site not specified: Secondary | ICD-10-CM

## 2024-03-08 LAB — MICROSCOPIC EXAMINATION
RBC, Urine: >30 /HPF — AB (ref 0–2)
WBC, UA: >30 /HPF — AB (ref 0–5)

## 2024-03-08 LAB — URINALYSIS, ROUTINE W REFLEX MICROSCOPIC
Glucose, UA: NEGATIVE
Nitrite, UA: POSITIVE — AB
Specific Gravity, UA: 1.02 (ref 1.005–1.030)
Urobilinogen, Ur: 1 mg/dL (ref 0.2–1.0)
pH, UA: 5 (ref 5.0–7.5)

## 2024-03-08 MED ORDER — DOXYCYCLINE HYCLATE 100 MG PO CAPS: 100.0000 mg | ORAL_CAPSULE | Freq: Two times a day (BID) | ORAL | 0 refills | Status: AC

## 2024-03-08 NOTE — Telephone Encounter (Signed)
 Patient presents today with complaints of  Dysuria.  UA and Culture done today.  Dr. Sherrilee reviewed results and Doxycycline  100 Mg BID X 7 days .  Patient aware of MD recommendations and that we will reach out with culture results.      [email or web address], CMA

## 2024-03-08 NOTE — Telephone Encounter (Signed)
 Dysuria  Patient called with c/o dysuria x 3-5 days days.  Pain: burning  Severity:5/10  Associated Signs and Symptoms:  Fever: no Temp.0 Chills: no Hematuria: no Urgency: yes Frequency: yes Hesitancy:yes sometime  Incontinence: yes Nausea: no Vomiting: no  Urologic History:  Any Recent Urologic Surgeries or Procedures:no Recurrent UTI's:yes Cystitis: no  Prostatitis:no Kidney or Bladder Stones: no Plan: Walk-in Clinic: no Appointment w/Physician: [no Lab visit scheduled for urine drop off: Yes Advice given:  Do you take on daily medications for UTI suppression No was on maintain dose

## 2024-03-11 LAB — URINE CULTURE

## 2024-03-13 ENCOUNTER — Ambulatory Visit: Payer: Self-pay

## 2024-03-15 ENCOUNTER — Telehealth: Payer: Self-pay

## 2024-03-15 ENCOUNTER — Ambulatory Visit: Admitting: Urology

## 2024-03-15 NOTE — Telephone Encounter (Signed)
 Return call to pt's daughter about missed appointment. Pt was rescheduled and daughter advised to call pt's pharmacy for medication refills. Pt's daughter voiced understanding.

## 2024-03-31 ENCOUNTER — Other Ambulatory Visit: Payer: Self-pay | Admitting: Cardiology

## 2024-05-01 ENCOUNTER — Other Ambulatory Visit: Payer: Self-pay | Admitting: Internal Medicine

## 2024-05-01 ENCOUNTER — Other Ambulatory Visit: Payer: Self-pay | Admitting: Family Medicine

## 2024-05-16 ENCOUNTER — Ambulatory Visit

## 2024-08-15 ENCOUNTER — Ambulatory Visit

## 2024-09-13 ENCOUNTER — Ambulatory Visit: Admitting: Urology

## 2024-11-14 ENCOUNTER — Ambulatory Visit

## 2025-01-12 ENCOUNTER — Ambulatory Visit
